# Patient Record
Sex: Female | Born: 1940 | Race: White | Hispanic: No | Smoking: Never smoker
Health system: Southern US, Community
[De-identification: ages and names within clinical notes are randomized; demographics above are authoritative.]

## PROBLEM LIST (undated history)

## (undated) DIAGNOSIS — K219 Gastro-esophageal reflux disease without esophagitis: Secondary | ICD-10-CM

## (undated) DIAGNOSIS — I10 Essential (primary) hypertension: Secondary | ICD-10-CM

## (undated) DIAGNOSIS — E119 Type 2 diabetes mellitus without complications: Secondary | ICD-10-CM

## (undated) DIAGNOSIS — E785 Hyperlipidemia, unspecified: Secondary | ICD-10-CM

## (undated) DIAGNOSIS — R002 Palpitations: Secondary | ICD-10-CM

## (undated) DIAGNOSIS — R945 Abnormal results of liver function studies: Secondary | ICD-10-CM

## (undated) DIAGNOSIS — I2510 Atherosclerotic heart disease of native coronary artery without angina pectoris: Secondary | ICD-10-CM

## (undated) HISTORY — DX: Essential (primary) hypertension: I10

## (undated) HISTORY — DX: Gastro-esophageal reflux disease without esophagitis: K21.9

## (undated) HISTORY — PX: ABDOMINAL HYSTERECTOMY: SHX81

## (undated) HISTORY — PX: CATARACT EXTRACTION: SUR2

## (undated) HISTORY — PX: KNEE SURGERY: SHX244

## (undated) HISTORY — DX: Type 2 diabetes mellitus without complications: E11.9

## (undated) HISTORY — PX: APPENDECTOMY: SHX54

## (undated) HISTORY — DX: Palpitations: R00.2

## (undated) HISTORY — DX: Atherosclerotic heart disease of native coronary artery without angina pectoris: I25.10

## (undated) HISTORY — PX: ESOPHAGEAL DILATION: SHX303

## (undated) HISTORY — DX: Abnormal results of liver function studies: R94.5

## (undated) HISTORY — DX: Hyperlipidemia, unspecified: E78.5

---

## 2005-06-17 ENCOUNTER — Ambulatory Visit: Payer: Self-pay | Admitting: Family Medicine

## 2006-06-24 ENCOUNTER — Ambulatory Visit: Payer: Self-pay | Admitting: Family Medicine

## 2007-03-03 HISTORY — PX: CARDIAC CATHETERIZATION: SHX172

## 2011-11-05 LAB — COMPREHENSIVE METABOLIC PANEL
ALT: 66 U/L — AB (ref 7–35)
Albumin: 4.3
BUN: 12 mg/dL (ref 4–21)
Calcium: 9.2 mg/dL
Creat: 0.87
Sodium: 143 mmol/L (ref 137–147)

## 2012-01-20 ENCOUNTER — Ambulatory Visit (INDEPENDENT_AMBULATORY_CARE_PROVIDER_SITE_OTHER): Payer: Medicare Other | Admitting: Gastroenterology

## 2012-01-20 ENCOUNTER — Encounter: Payer: Self-pay | Admitting: Gastroenterology

## 2012-01-20 VITALS — BP 125/72 | HR 68 | Temp 98.2°F | Ht 65.0 in | Wt 162.0 lb

## 2012-01-20 DIAGNOSIS — R109 Unspecified abdominal pain: Secondary | ICD-10-CM

## 2012-01-20 DIAGNOSIS — R1010 Upper abdominal pain, unspecified: Secondary | ICD-10-CM

## 2012-01-20 DIAGNOSIS — K7581 Nonalcoholic steatohepatitis (NASH): Secondary | ICD-10-CM | POA: Insufficient documentation

## 2012-01-20 DIAGNOSIS — K219 Gastro-esophageal reflux disease without esophagitis: Secondary | ICD-10-CM

## 2012-01-20 DIAGNOSIS — R7989 Other specified abnormal findings of blood chemistry: Secondary | ICD-10-CM

## 2012-01-20 DIAGNOSIS — R1110 Vomiting, unspecified: Secondary | ICD-10-CM | POA: Insufficient documentation

## 2012-01-20 DIAGNOSIS — Z86010 Personal history of colonic polyps: Secondary | ICD-10-CM

## 2012-01-20 DIAGNOSIS — R945 Abnormal results of liver function studies: Secondary | ICD-10-CM

## 2012-01-20 LAB — CBC WITH DIFFERENTIAL/PLATELET
Eosinophils Relative: 2 % (ref 0–5)
HCT: 41.4 % (ref 36.0–46.0)
Lymphocytes Relative: 31 % (ref 12–46)
Lymphs Abs: 3.2 10*3/uL (ref 0.7–4.0)
MCV: 81 fL (ref 78.0–100.0)
Monocytes Absolute: 0.7 10*3/uL (ref 0.1–1.0)
Platelets: 267 10*3/uL (ref 150–400)
RBC: 5.11 MIL/uL (ref 3.87–5.11)
WBC: 10.3 10*3/uL (ref 4.0–10.5)

## 2012-01-20 LAB — IRON AND TIBC
TIBC: 344 ug/dL (ref 250–470)
UIBC: 302 ug/dL (ref 125–400)

## 2012-01-20 MED ORDER — PEG 3350-KCL-NA BICARB-NACL 420 G PO SOLR: 4000.0000 mL | ORAL | Status: DC

## 2012-01-20 NOTE — Progress Notes (Signed)
Faxed to PCP

## 2012-01-20 NOTE — Progress Notes (Signed)
Primary Care Physician:  Chip Boer, [place]  Primary Gastroenterologist:  Barney Drain, MD   Chief Complaint  Patient presents with  . Gastrophageal Reflux  . Emesis  . abnormal labs    HPI:  Carmen [name] is a 71 y.o. female here at the request of her PCP for further evaluation of refractory reflux disease, vomiting, abnormal LFTs. She presents today with her daughter who is a Equities trader at [employer]. Patient states she has not felt well in a few months. Recently a couple of episodes of significant N/V. One time had eaten bacon/egg biscuit a few hours before. She complains of a bad taste in mouth. No heartburn. On Dexilant 66m daily. No dysphagia. Gallbladder remains in situ. BM regular. No melena, brbpr. Lost 6-7 pounds in the last few months, able to come off of DM meds. Intentional weight loss with dietary modifications. No melena, brpbr. Remote colonscopy, two small polyps. Remote EGD with dilation over 20 years ago.  Patient was told she had abnormal LFTs back in September 2013. She states at that time her cholesterol medication was held and she started taking fish oil. See below for lab results. When rechecked her transaminases remained elevated, see below. She denies any history of alcohol use in the present or past. She had negative viral markers. No abdominal ultrasound this point.  Current Outpatient Prescriptions  Medication Sig Dispense Refill  . amLODipine (NORVASC) 10 MG tablet Take 10 mg by mouth daily.      .Marland Kitchenaspirin 81 MG chewable tablet Chew 81 mg by mouth daily.      . clopidogrel (PLAVIX) 75 MG tablet Take 75 mg by mouth daily.      .Marland Kitchendexlansoprazole (DEXILANT) 60 MG capsule Take 60 mg by mouth daily.      . fish oil-omega-3 fatty acids 1000 MG capsule Take 2 g by mouth 2 (two) times daily.      .Marland KitchenFLUoxetine (PROZAC) 40 MG capsule Take 40 mg by mouth 2 (two) times daily. 2 pills in the morning 2 at night      . lisinopril (PRINIVIL,ZESTRIL) 2.5 MG tablet  Take 2.5 mg by mouth daily.      . metoprolol succinate (TOPROL-XL) 100 MG 24 hr tablet Take 100 mg by mouth daily. Take with or immediately following a meal.        Allergies as of 01/20/2012  . (No Known Allergies)    Past Medical History  Diagnosis Date  . Diabetes     diet controlled  . GERD (gastroesophageal reflux disease)     Esophagus dilated in the 1990s per patient  . Abnormal LFTs   . Hypertension   . Hyperlipidemia   . CAD (coronary artery disease)     Past Surgical History  Procedure Date  . Cardiac catheterization 2009    with stent  . Abdominal hysterectomy   . Esophageal dilation     In the 1990s  . Appendectomy     Family History  Problem Relation Age of Onset  . Heart attack Father   . Kidney disease Mother   . Diabetes Brother   . Colon cancer Neg Hx   . Liver disease Neg Hx     History   Social History  . Marital Status: Single    Spouse Name: N/A    Number of Children: 3  . Years of Education: N/A   Occupational History  . Not on file.   Social History Main Topics  . Smoking status:  Never Smoker   . Smokeless tobacco: Not on file  . Alcohol Use: No  . Drug Use: No  . Sexually Active: Not on file   Other Topics Concern  . Not on file   Social History Narrative  . No narrative on file      ROS:  General: Negative for anorexia, weight loss, fever, chills, fatigue, weakness. Eyes: Negative for vision changes.  ENT: Negative for hoarseness, nasal congestion. CV: Negative for chest pain, angina, palpitations, dyspnea on exertion, peripheral edema.  Respiratory: Negative for dyspnea at rest, dyspnea on exertion, cough, sputum, wheezing.  GI: See history of present illness. GU:  Negative for dysuria, hematuria, urinary incontinence, urinary frequency, nocturnal urination.  MS: Negative for joint pain, low back pain.  Derm: Negative for rash or itching.  Neuro: Negative for weakness, abnormal sensation, seizure, frequent  headaches, memory loss, confusion.  Psych: Negative for anxiety, depression, suicidal ideation, hallucinations.  Endo: Negative for unusual weight change.  Heme: Negative for bruising or bleeding. Allergy: Negative for rash or hives.    Physical Examination:  BP 125/72  Pulse 68  Temp 98.2 F (36.8 C) (Temporal)  Ht 5' 5"  (1.651 m)  Wt 162 lb (73.483 kg)  BMI 26.96 kg/m2   General: Well-nourished, well-developed in no acute distress. Accompanied by daughter. Head: Normocephalic, atraumatic.   Eyes: Conjunctiva pink, no icterus. Mouth: Oropharyngeal mucosa moist and pink , no lesions erythema or exudate. Neck: Supple without thyromegaly, masses, or lymphadenopathy.  Lungs: Clear to auscultation bilaterally.  Heart: Regular rate and rhythm, no murmurs rubs or gallops.  Abdomen: Bowel sounds are normal, nontender, nondistended, no hepatosplenomegaly or masses, no abdominal bruits or    hernia , no rebound or guarding.   Rectal: deferred to time of colonoscopy Extremities: No lower extremity edema. No clubbing or deformities.  Neuro: Alert and oriented x 4 , grossly normal neurologically.  Skin: Warm and dry, no rash or jaundice.   Psych: Alert and cooperative, normal mood and affect.  Labs: Labs from 11/05/2011. Glucose 104, BUN 12, creatinine 0.87, sodium 143, potassium 4.5, calcium 9.2, total bilirubin 0.7, alkaline phosphatase 104, AST 55, ALT 66, albumin 4.3, hemoglobin A1c 6.4, total cholesterol 149, HDL 46, LDL 70.  Labs from 01/13/2012. Total bilirubin 0.9, alkaline phosphatase 122, AST 48, ALT 90, albumin 4.4. H. Pylori IgA negative. H. Pylori IgG negative. Hepatitis B IgM nonreactive. Hepatitis B Core antibody IgM negative. Hepatitis B surface antigen negative. Hepatitis C antibody negative.  Imaging Studies: No results found.

## 2012-01-20 NOTE — Patient Instructions (Addendum)
We have scheduled you for an upper endoscopy and colonoscopy. Please see separate instructions.  Please have your lab work and ultrasound done.

## 2012-01-20 NOTE — Assessment & Plan Note (Signed)
Transaminitis, slightly worse recently compared to in September. Reportedly her statin was held in September. No prior history of alcohol use. 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 Rule out hemachromatosis. Viral markers were negative. Abdominal ultrasound to evaluate liver. We'll also check gallbladder given recent vomiting and upper abdominal discomfort postprandially. Further recommendations to follow.

## 2012-01-20 NOTE — Assessment & Plan Note (Signed)
History of colonic polyps, last colonoscopy every 10 years ago per her report. She would like to pursue used surveillance colonoscopy at this time which is reasonable.  I have discussed the risks, alternatives, benefits with regards to but not limited to the risk of reaction to medication, bleeding, infection, perforation and the patient is agreeable to proceed. Written consent to be obtained.

## 2012-01-20 NOTE — Progress Notes (Signed)
REVIEWED.  

## 2012-01-20 NOTE — Assessment & Plan Note (Signed)
Refractory GERD, intermittent vomiting on chronic Dexilant. Last upper endoscopy nearly 20 years ago per her report, required esophageal dilation at that time. Recommend upper endoscopy for further evaluation. Cannot exclude gallbladder disease at this point.  I have discussed the risks, alternatives, benefits with regards to but not limited to the risk of reaction to medication, bleeding, infection, perforation and the patient is agreeable to proceed. Written consent to be obtained.

## 2012-01-21 LAB — HEPATIC FUNCTION PANEL
AST: 48 U/L
Alkaline Phosphatase: 122 U/L
HELICOBACTER PYLORI AB, IGA: NEGATIVE
Helicobacter Pylori Antibody-IgG: NEGATIVE
Total Bilirubin: 0.9 mg/dL

## 2012-01-22 ENCOUNTER — Ambulatory Visit (HOSPITAL_COMMUNITY)
Admission: RE | Admit: 2012-01-22 | Discharge: 2012-01-22 | Disposition: A | Discharge status: Home / Self Care | Payer: Medicare Other | Source: Ambulatory Visit | Attending: Gastroenterology | Admitting: Gastroenterology

## 2012-01-22 DIAGNOSIS — K7689 Other specified diseases of liver: Secondary | ICD-10-CM | POA: Insufficient documentation

## 2012-01-22 DIAGNOSIS — R945 Abnormal results of liver function studies: Secondary | ICD-10-CM

## 2012-01-22 DIAGNOSIS — R7989 Other specified abnormal findings of blood chemistry: Secondary | ICD-10-CM | POA: Insufficient documentation

## 2012-01-22 DIAGNOSIS — R1110 Vomiting, unspecified: Secondary | ICD-10-CM | POA: Insufficient documentation

## 2012-01-22 DIAGNOSIS — R161 Splenomegaly, not elsewhere classified: Secondary | ICD-10-CM | POA: Insufficient documentation

## 2012-01-22 DIAGNOSIS — R109 Unspecified abdominal pain: Secondary | ICD-10-CM | POA: Insufficient documentation

## 2012-01-22 DIAGNOSIS — R1010 Upper abdominal pain, unspecified: Secondary | ICD-10-CM

## 2012-01-22 NOTE — Progress Notes (Signed)
Quick Note:  LMOM to call. ______ 

## 2012-01-22 NOTE — Progress Notes (Signed)
Quick Note:  No gallstones. Fatty liver. Mild splenomegaly. Await EGD findings. Based on findings, she may need further work-up/imaging due to splenomegaly. ______

## 2012-01-22 NOTE — Progress Notes (Signed)
Quick Note:  No anemia. Iron low normal, fe sat low, ferritin low normal.  No signs of hemochromatosis. Await EGD/TCS and abd u/s. ______

## 2012-02-01 ENCOUNTER — Encounter (HOSPITAL_COMMUNITY): Payer: Self-pay | Admitting: Pharmacy Technician

## 2012-02-15 ENCOUNTER — Encounter (HOSPITAL_COMMUNITY)
Admission: RE | Disposition: A | Discharge status: Home / Self Care | Payer: Self-pay | Source: Ambulatory Visit | Attending: Gastroenterology

## 2012-02-15 ENCOUNTER — Encounter (HOSPITAL_COMMUNITY): Payer: Self-pay | Admitting: *Deleted

## 2012-02-15 ENCOUNTER — Ambulatory Visit (HOSPITAL_COMMUNITY)
Admission: RE | Admit: 2012-02-15 | Discharge: 2012-02-15 | Disposition: A | Discharge status: Home / Self Care | Payer: Medicare Other | Source: Ambulatory Visit | Attending: Gastroenterology | Admitting: Gastroenterology

## 2012-02-15 DIAGNOSIS — K2970 Gastritis, unspecified, without bleeding: Secondary | ICD-10-CM

## 2012-02-15 DIAGNOSIS — D126 Benign neoplasm of colon, unspecified: Secondary | ICD-10-CM

## 2012-02-15 DIAGNOSIS — D1330 Benign neoplasm of unspecified part of small intestine: Secondary | ICD-10-CM

## 2012-02-15 DIAGNOSIS — Z86010 Personal history of colonic polyps: Secondary | ICD-10-CM

## 2012-02-15 DIAGNOSIS — R1110 Vomiting, unspecified: Secondary | ICD-10-CM

## 2012-02-15 DIAGNOSIS — I10 Essential (primary) hypertension: Secondary | ICD-10-CM | POA: Insufficient documentation

## 2012-02-15 DIAGNOSIS — K219 Gastro-esophageal reflux disease without esophagitis: Secondary | ICD-10-CM

## 2012-02-15 DIAGNOSIS — K648 Other hemorrhoids: Secondary | ICD-10-CM | POA: Insufficient documentation

## 2012-02-15 DIAGNOSIS — K2990 Gastroduodenitis, unspecified, without bleeding: Secondary | ICD-10-CM

## 2012-02-15 DIAGNOSIS — K5730 Diverticulosis of large intestine without perforation or abscess without bleeding: Secondary | ICD-10-CM | POA: Insufficient documentation

## 2012-02-15 DIAGNOSIS — R945 Abnormal results of liver function studies: Secondary | ICD-10-CM

## 2012-02-15 DIAGNOSIS — Z1211 Encounter for screening for malignant neoplasm of colon: Secondary | ICD-10-CM

## 2012-02-15 DIAGNOSIS — K222 Esophageal obstruction: Secondary | ICD-10-CM

## 2012-02-15 DIAGNOSIS — E119 Type 2 diabetes mellitus without complications: Secondary | ICD-10-CM | POA: Insufficient documentation

## 2012-02-15 DIAGNOSIS — R1013 Epigastric pain: Secondary | ICD-10-CM

## 2012-02-15 DIAGNOSIS — K2940 Chronic atrophic gastritis without bleeding: Secondary | ICD-10-CM | POA: Insufficient documentation

## 2012-02-15 DIAGNOSIS — D128 Benign neoplasm of rectum: Secondary | ICD-10-CM | POA: Insufficient documentation

## 2012-02-15 DIAGNOSIS — R1010 Upper abdominal pain, unspecified: Secondary | ICD-10-CM

## 2012-02-15 HISTORY — PX: ESOPHAGEAL DILATION: SHX303

## 2012-02-15 HISTORY — PX: COLONOSCOPY WITH ESOPHAGOGASTRODUODENOSCOPY (EGD): SHX5779

## 2012-02-15 SURGERY — COLONOSCOPY WITH ESOPHAGOGASTRODUODENOSCOPY (EGD)
Anesthesia: Moderate Sedation

## 2012-02-15 MED ORDER — PROMETHAZINE HCL 25 MG/ML IJ SOLN
INTRAMUSCULAR | Status: DC | PRN
  Administered 2012-02-15: 12.5 mg via INTRAVENOUS

## 2012-02-15 MED ORDER — MIDAZOLAM HCL 5 MG/5ML IJ SOLN
INTRAMUSCULAR | Status: DC | PRN
  Administered 2012-02-15 (×2): 1 mg via INTRAVENOUS
  Administered 2012-02-15 (×2): 2 mg via INTRAVENOUS

## 2012-02-15 MED ORDER — SODIUM CHLORIDE 0.45 % IV SOLN
INTRAVENOUS | Status: DC
  Administered 2012-02-15: 1000 mL via INTRAVENOUS

## 2012-02-15 MED ORDER — PANTOPRAZOLE SODIUM 40 MG PO TBEC: DELAYED_RELEASE_TABLET | ORAL | Status: DC

## 2012-02-15 MED ORDER — MEPERIDINE HCL 100 MG/ML IJ SOLN
INTRAMUSCULAR | Status: DC | PRN
  Administered 2012-02-15 (×4): 25 mg via INTRAVENOUS

## 2012-02-15 MED ORDER — MINERAL OIL PO OIL
TOPICAL_OIL | ORAL | Status: AC
  Filled 2012-02-15: qty 30

## 2012-02-15 MED ORDER — STERILE WATER FOR IRRIGATION IR SOLN
Status: DC | PRN
  Administered 2012-02-15: 09:00:00

## 2012-02-15 MED ORDER — PROMETHAZINE HCL 25 MG/ML IJ SOLN
INTRAMUSCULAR | Status: AC
  Filled 2012-02-15: qty 1

## 2012-02-15 MED ORDER — MEPERIDINE HCL 100 MG/ML IJ SOLN
INTRAMUSCULAR | Status: AC
  Filled 2012-02-15: qty 1

## 2012-02-15 MED ORDER — MIDAZOLAM HCL 5 MG/5ML IJ SOLN
INTRAMUSCULAR | Status: AC
  Filled 2012-02-15: qty 10

## 2012-02-15 MED ORDER — SODIUM CHLORIDE 0.9 % IJ SOLN
INTRAMUSCULAR | Status: AC
  Filled 2012-02-15: qty 10

## 2012-02-15 NOTE — H&P (Signed)
Primary Care Physician:  Chip Boer, PA Primary Gastroenterologist:  Dr. Oneida Alar  Pre-Procedure History & Physical: HPI:  Carmen [name] is a 71 y.o. female here for DYSPEPSIA/ PERSONAL HISTORY OF POLYPS. Pt stopped Asa/Plavix.  Past Medical History  Diagnosis Date  . Diabetes     diet controlled  . GERD (gastroesophageal reflux disease)     Esophagus dilated in the 1990s per patient  . Abnormal LFTs   . Hypertension   . Hyperlipidemia   . CAD (coronary artery disease)     Past Surgical History  Procedure Date  . Cardiac catheterization 2009    with stent  . Abdominal hysterectomy   . Esophageal dilation     In the 1990s  . Appendectomy     Prior to Admission medications   Medication Sig Start Date End Date Taking? Authorizing Provider  amLODipine (NORVASC) 10 MG tablet Take 10 mg by mouth daily.   Yes Historical Provider, MD  dexlansoprazole (DEXILANT) 60 MG capsule Take 60 mg by mouth daily.   Yes Historical Provider, MD  fish oil-omega-3 fatty acids 1000 MG capsule Take 1 g by mouth 2 (two) times daily.    Yes Historical Provider, MD  FLUoxetine (PROZAC) 40 MG capsule Take 80 mg by mouth 2 (two) times daily. 2 pills in the morning 2 at night   Yes Historical Provider, MD  lisinopril (PRINIVIL,ZESTRIL) 2.5 MG tablet Take 2.5 mg by mouth daily.   Yes Historical Provider, MD  metoprolol succinate (TOPROL-XL) 100 MG 24 hr tablet Take 100 mg by mouth daily. Take with or immediately following a meal.   Yes Historical Provider, MD  polyethylene glycol-electrolytes (TRILYTE) 420 G solution Take 4,000 mLs by mouth as directed. 01/20/12  Yes Danie Binder, MD  aspirin 81 MG chewable tablet Chew 81 mg by mouth daily.    Historical Provider, MD  clopidogrel (PLAVIX) 75 MG tablet Take 75 mg by mouth daily.    Historical Provider, MD    Allergies as of 01/20/2012  . (No Known Allergies)    Family History  Problem Relation Age of Onset  . Heart attack Father   . Kidney  disease Mother   . Diabetes Brother   . Colon cancer Neg Hx   . Liver disease Neg Hx     History   Social History  . Marital Status: Single    Spouse Name: N/A    Number of Children: 3  . Years of Education: N/A   Occupational History  . Not on file.   Social History Main Topics  . Smoking status: Never Smoker   . Smokeless tobacco: Not on file  . Alcohol Use: No  . Drug Use: No  . Sexually Active: Not on file   Other Topics Concern  . Not on file   Social History Narrative  . No narrative on file    Review of Systems: See HPI, otherwise negative ROS   Physical Exam: BP 139/69  Pulse 62  Temp 98 F (36.7 C) (Oral)  Resp 20  Ht 5' 5"  (1.651 m)  Wt 162 lb (73.483 kg)  BMI 26.96 kg/m2  SpO2 93% General:   Alert,  pleasant and cooperative in NAD Head:  Normocephalic and atraumatic. Neck:  Supple; Lungs:  Clear throughout to auscultation.    Heart:  Regular rate and rhythm. Abdomen:  Soft, nontender and nondistended. Normal bowel sounds, without guarding, and without rebound.   Neurologic:  Alert and  oriented x4;  grossly normal  neurologically.  Impression/Plan:     DYSPEPSIA/personal history of polyps  PLAN:  TCS/EGD TODAY

## 2012-02-15 NOTE — Op Note (Addendum)
[facility] [address] [place], [address]   COLONOSCOPY PROCEDURE REPORT  PATIENT: Carmen, [name]  MR#: [id number] BIRTHDATE: 05/13/1940 , 71  yrs. old GENDER: Female ENDOSCOPIST: Barney Drain, MD REFERRED BY:   Chip Boer, PA-C PROCEDURE DATE:  02/15/2012 PROCEDURE:   Colonoscopy with cold biopsy polypectomy and Colonoscopy with snare polypectomy INDICATIONS:High risk patient with personal history of colonic polyps. MEDICATIONS: Demerol 50 mg IV, Versed 5 mg IV, and Promethazine (Phenergan) 12.4m IV  DESCRIPTION OF PROCEDURE:    Physical exam was performed.  Informed consent was obtained from the patient after explaining the benefits, risks, and alternatives to procedure.  The patient was connected to monitor and placed in left lateral position. Continuous oxygen was provided by nasal cannula and IV medicine administered through an indwelling cannula.  After administration of sedation and rectal exam, the patients rectum was intubated and the Pentax Colonoscope [id number] colonoscope was advanced under direct visualization to the cecum.  The scope was removed slowly by carefully examining the color, texture, anatomy, and integrity mucosa on the way out.  The patient was recovered in endoscopy and discharged home in satisfactory condition.     COLON FINDINGS: Three sessile polyps measuring 3-8 mm in size were found at the cecum.  A polypectomy was performed with cold forceps and using snare cautery.  The resection was complete and the polyp tissue was completely retrieved, Eight polyps measuring 3-8 mm in size were found in the ascending colon.  A polypectomy was performed with cold forceps and using snare cautery.  , A single polyp measuring 6 mm in size was found in the transverse colon. Polypectomy was performed using hot snare.  , Two polyps ranging between 3-532min size were found in the sigmoid colon.  A polypectomy was performed with cold  forceps.  , A sessile polyp measuring 6 mm in size was found in the rectum.  A polypectomy was performed using snare cautery.  , Mild diverticulosis was noted in the sigmoid colon.  , and Small internal hemorrhoids were found.   PREP QUALITY: good. CECAL W/D TIME: 36 minutes  COMPLICATIONS: None  ENDOSCOPIC IMPRESSION: MULTIPLE COLON POLYPS MILD SIGMOID DIVERTICULOSIS SMALL INTERNAL HEMORRHOIDS NO SOURCE FOR ABD PAIN AND WEIGHT LOSS IDENTIFIED  RECOMMENDATIONS: Await biopsy HIGH FIBER DIET RESTART ASA/PLAVIX 12/19 TCS IN 1 YEAR IF ADVANCED POLYPS & 3 YEARS IF SIMPLE POLYPS ALL FIRST DEGREE RELATIVES NEEDS TCS AT AGE 71     _______________________________ eSigned: Barney DrainMD 02/15/2012 10:04 AM Revised: 02/15/2012 10:04 AM    PATIENT NAME:  Carmen, CasadosR#: [id number]

## 2012-02-15 NOTE — Op Note (Signed)
[facility] [address] [place], [address]   ENDOSCOPY PROCEDURE REPORT  PATIENT: Carmen, [name]  MR#: [id number] BIRTHDATE: September 03, 1940 , 71  yrs. old GENDER: Female  ENDOSCOPIST: Barney Drain, MD REFFERED IY:MEBR Claggett, PA-C  PROCEDURE DATE:  02/15/2012 PROCEDURE:   EGD with biopsy for H.  pylori and EGD with dilatation over guidewire  INDICATIONS:1.  epigastric pain, weight loss, AFRAID TO EAT DUE TO NAUSEA AND PAIN-PMHX: EGD/DIL 90s, GERD, CAD. TAKES ASA/PLAVIX, BUT HELD IT FOR HER PROCEDURE. ELEVATED ALT. ABD U/S-FATTY LIVER  MEDICATIONS: TCS+ Demerol 50 mg IV and Versed 74m IV TOPICAL ANESTHETIC: Cetacaine Spray  DESCRIPTION OF PROCEDURE:   After the risks benefits and alternatives of the procedure were thoroughly explained, informed consent was obtained.  The [id number] (([id number] and [id number] (([id number]  endoscope was introduced through the mouth and advanced to the second portion of the duodenum. The instrument was slowly withdrawn as the mucosa was carefully examined.  Prior to withdrawal of the scope, the guidwire was placed.  The esophagus was dilated successfully.  The patient was recovered in endoscopy and discharged home in satisfactory condition.      ESOPHAGUS: A stricture was found at the gastroesophageal junction. The stenosis was traversable with the endoscope.   NO BARRETT'S.  STOMACH: Mild non-erosive gastritis (inflammation) was found in the gastric antrum.  Multiple biopsies were performed using cold forceps.  DUODENUM: A sessile polyp measuring 4 mm in size was found in the duodenal bulb.  A polypectomy was performed with a cold forceps. The duodenal mucosa showed no abnormalities in the 2nd part of the duodenum.  Dilation was then performed at the gastroesphageal junction Dilator: Savary over guidewire Size(s): 12.8-16 MM Resistance: minimal Heme: yes SML  COMPLICATIONS: There were no complications.   ENDOSCOPIC  IMPRESSION: 1.   Stricture was found at the gastroesophageal junction 2.   Non-erosive gastritis (inflammation) was found in the gastric antrum; multiple biopsies 3.   Sessile polyp measuring 4 mm in size was found in the duodenal bulb 4.   The duodenal mucosa showed no abnormalities in the 2nd part of the duodenum 5.   POST-PRANDIAL PAIN/NAUSEA MAY BE DUE TO ESOPHAGEAL STRICTURE, AND/OR GASTRITIS. DIFFERENTIAL DIAGNOSIS INCLUDES CHRONIC MESENTERIC ISCHEMIA.    RECOMMENDATIONS: CONTINUE Dexilant forever.  RESTART ASPIRIN & PLAVIX 12/19.  AVOID ITEMS THAT TRIGGER GASTRITIS.  FOLLOW A HIGH FIBER/LOW FAT DIET.  AVOID ITEMS THAT CAUSE BLOATING.   BIOPSY RESULTS WILL BE BACK IN 7 DAYS.  FOLLOW UP IN 3 MOS WITH DR.  Flor Houdeshell.      _______________________________ eLorrin MaisBarney Drain MD 02/15/2012 1:32 PM      PATIENT NAME:  Carmen [name], LaymonMR#: [id number]

## 2012-02-17 NOTE — Progress Notes (Signed)
Quick Note:  Reviewed EGD/TCS findings. Path still pending.  Patient to follow up with [facility] in 3 months. No further imaging for mild splenomegaly at this time. Will readdress at time of f/u ov. ______

## 2012-02-18 ENCOUNTER — Encounter (HOSPITAL_COMMUNITY): Payer: Self-pay | Admitting: Gastroenterology

## 2012-02-18 NOTE — Progress Notes (Signed)
REVIEWED.  REFER PT TO HEMATOLOGY FOR SPLENOMEGALY.

## 2012-02-18 NOTE — Progress Notes (Signed)
Please let patient know, Dr. Oneida Alar recommends she be referred to hematologist (Dr. Tressie Stalker), for enlarged spleen seen on prior u/s in 01/2012.

## 2012-02-19 ENCOUNTER — Telehealth: Payer: Self-pay | Admitting: Gastroenterology

## 2012-02-19 NOTE — Telephone Encounter (Signed)
PT HELD ASA/PLAVIX. DOSE TODAY. HAD ONE BLOODY WATERY STOOL TODAY. HAD 15 POLYPS REMOVED 12/16. HOLD ASA/PLAVIX. MAY RESTART MON 12/23. SOFT DIET. GO TO ED IF PT HAS ANOTHER LARGE BLOODY STOOL.

## 2012-02-22 ENCOUNTER — Telehealth: Payer: Self-pay | Admitting: Gastroenterology

## 2012-02-22 NOTE — Telephone Encounter (Signed)
Called patient TO DISCUSS RESULTS & FOLLOW UP ON RECTAL BLEEDING. LVM-CALL [phone] TO DISCUSS. She had MULTIPLE simple adenomas removed from her colon. SHE HAD ONE SIMPLE ADENOMA REMOVED FROM HER SMALL BOWEL. HER stomach Bx shows mild gastritis.   CONTINUE Dexilant forever.   RESTART ASPIRIN & PLAVIX 12/23.  FOLLOW A HIGH FIBER/LOW FAT DIET. AVOID ITEMS THAT CAUSE BLOATING. SEE INFO BELOW.  FOLLOW UP IN 3 MOS WITH DR. FIELDS.  REPEAT COLONOSCOPY IN 3 YEARS.  ALL FIRST DEGREE RELATIVES NEEDS TCS AT AGE 80.

## 2012-02-22 NOTE — Progress Notes (Signed)
Called and informed pt's daughter, Daine Floras.

## 2012-02-23 NOTE — Telephone Encounter (Signed)
Results faxed to PCP, recalls made

## 2012-02-29 ENCOUNTER — Telehealth: Payer: Self-pay | Admitting: Gastroenterology

## 2012-02-29 NOTE — Progress Notes (Signed)
Pt was informed about the referral to hematology. Routing to [facility] for the referral.

## 2012-02-29 NOTE — Telephone Encounter (Signed)
Patient called this morning and asked if the nurse would call her at her daughter's house [phone]. She was asking about her results from a procedure she had about 2 weeks ago.

## 2012-02-29 NOTE — Telephone Encounter (Signed)
See phone note of 02/22/2012.

## 2012-02-29 NOTE — Telephone Encounter (Signed)
Pt had called and requested results. I told her that Dr. Oneida Alar and called to discuss on 02/22/2012. She said she has not had any more rectal bleeding , just had it that one time. She is doing fine. I gave her all of the info below. She said that Dr. Oneida Alar sent in a prescription for her to the pharmacy for Pantoprozole  40 mg   Bid and she is taking it and doing well. Please advise if she is to have the Dexilant or Pantoprozole. ( She said it said bid for 3 months and then do once daily.

## 2012-03-01 NOTE — Progress Notes (Signed)
Referral has been faxed to Dr. Karen Kitchens office and they will contact the patient with date & time

## 2012-03-03 ENCOUNTER — Other Ambulatory Visit: Payer: Self-pay | Admitting: Gastroenterology

## 2012-03-03 MED ORDER — PANTOPRAZOLE SODIUM 40 MG PO TBEC: DELAYED_RELEASE_TABLET | ORAL | Status: DC

## 2012-03-03 NOTE — Progress Notes (Signed)
Protonix controls sx. Rx sent.

## 2012-03-03 NOTE — Telephone Encounter (Addendum)
PLEASE CALL PT.  SHE MAY TAKE PROTONIX OR DEXILANT TO CONTROL HER REFLUX SYMPTOMS. SHE SHOULD NOT TAKE BOTH. SHE SHOULD TAKE THE MED THAT BEST CONTROLS HER Sx. SHE SHOULD LET ME KNOW WHICH ONE SHE CHOOSES.

## 2012-03-03 NOTE — Telephone Encounter (Signed)
Pt said the Protonix is working and that is the one she is going to take.

## 2012-03-08 ENCOUNTER — Emergency Department (HOSPITAL_COMMUNITY): Payer: Medicare Other

## 2012-03-08 ENCOUNTER — Encounter (HOSPITAL_COMMUNITY): Payer: Self-pay | Admitting: *Deleted

## 2012-03-08 ENCOUNTER — Other Ambulatory Visit: Payer: Self-pay | Admitting: Urgent Care

## 2012-03-08 ENCOUNTER — Telehealth: Payer: Self-pay | Admitting: *Deleted

## 2012-03-08 ENCOUNTER — Emergency Department (HOSPITAL_COMMUNITY)
Admission: EM | Admit: 2012-03-08 | Discharge: 2012-03-08 | Disposition: A | Discharge status: Home / Self Care | Payer: Medicare Other | Attending: Emergency Medicine | Admitting: Emergency Medicine

## 2012-03-08 DIAGNOSIS — Z79899 Other long term (current) drug therapy: Secondary | ICD-10-CM | POA: Insufficient documentation

## 2012-03-08 DIAGNOSIS — Z860100 Personal history of colon polyps, unspecified: Secondary | ICD-10-CM | POA: Insufficient documentation

## 2012-03-08 DIAGNOSIS — R1110 Vomiting, unspecified: Secondary | ICD-10-CM

## 2012-03-08 DIAGNOSIS — I2510 Atherosclerotic heart disease of native coronary artery without angina pectoris: Secondary | ICD-10-CM | POA: Insufficient documentation

## 2012-03-08 DIAGNOSIS — E119 Type 2 diabetes mellitus without complications: Secondary | ICD-10-CM | POA: Insufficient documentation

## 2012-03-08 DIAGNOSIS — E785 Hyperlipidemia, unspecified: Secondary | ICD-10-CM | POA: Insufficient documentation

## 2012-03-08 DIAGNOSIS — R112 Nausea with vomiting, unspecified: Secondary | ICD-10-CM

## 2012-03-08 DIAGNOSIS — R630 Anorexia: Secondary | ICD-10-CM | POA: Insufficient documentation

## 2012-03-08 DIAGNOSIS — R5381 Other malaise: Secondary | ICD-10-CM | POA: Insufficient documentation

## 2012-03-08 DIAGNOSIS — R110 Nausea: Secondary | ICD-10-CM

## 2012-03-08 DIAGNOSIS — K219 Gastro-esophageal reflux disease without esophagitis: Secondary | ICD-10-CM | POA: Insufficient documentation

## 2012-03-08 DIAGNOSIS — R634 Abnormal weight loss: Secondary | ICD-10-CM

## 2012-03-08 DIAGNOSIS — I10 Essential (primary) hypertension: Secondary | ICD-10-CM | POA: Insufficient documentation

## 2012-03-08 DIAGNOSIS — N390 Urinary tract infection, site not specified: Secondary | ICD-10-CM | POA: Insufficient documentation

## 2012-03-08 DIAGNOSIS — R5383 Other fatigue: Secondary | ICD-10-CM | POA: Insufficient documentation

## 2012-03-08 LAB — COMPREHENSIVE METABOLIC PANEL
ALT: 106 U/L — ABNORMAL HIGH (ref 0–35)
AST: 57 U/L — ABNORMAL HIGH (ref 0–37)
BUN: 16 mg/dL (ref 6–23)
CO2: 29 mEq/L (ref 19–32)
Calcium: 9.6 mg/dL (ref 8.4–10.5)
Chloride: 100 mEq/L (ref 96–112)
Creat: 0.82 mg/dL (ref 0.50–1.10)
Total Bilirubin: 1 mg/dL (ref 0.3–1.2)

## 2012-03-08 LAB — URINALYSIS W MICROSCOPIC + REFLEX CULTURE
Casts: NONE SEEN
Crystals: NONE SEEN
Nitrite: POSITIVE — AB
Specific Gravity, Urine: >1.03 — ABNORMAL HIGH (ref 1.005–1.030)
Urobilinogen, UA: 0.2 mg/dL (ref 0.0–1.0)
pH: 5.5 (ref 5.0–8.0)

## 2012-03-08 LAB — CBC WITH DIFFERENTIAL/PLATELET
Basophils Relative: 0 % (ref 0–1)
Eosinophils Absolute: 0.2 10*3/uL (ref 0.0–0.7)
Eosinophils Relative: 1 % (ref 0–5)
HCT: 42.7 % (ref 36.0–46.0)
Hemoglobin: 14 g/dL (ref 12.0–15.0)
MCH: 27.2 pg (ref 26.0–34.0)
MCHC: 32.8 g/dL (ref 30.0–36.0)
MCV: 83.1 fL (ref 78.0–100.0)
Monocytes Absolute: 0.9 10*3/uL (ref 0.1–1.0)
Monocytes Relative: 8 % (ref 3–12)
RDW: 13.7 % (ref 11.5–15.5)

## 2012-03-08 LAB — LIPASE: Lipase: 30 U/L (ref 11–59)

## 2012-03-08 MED ORDER — ONDANSETRON HCL 4 MG/2ML IJ SOLN
4.0000 mg | Freq: Once | INTRAMUSCULAR | Status: AC
  Administered 2012-03-08: 4 mg via INTRAVENOUS
  Filled 2012-03-08: qty 2

## 2012-03-08 MED ORDER — ONDANSETRON HCL 4 MG PO TABS: 4.0000 mg | ORAL_TABLET | Freq: Four times a day (QID) | ORAL | Status: DC | PRN

## 2012-03-08 MED ORDER — CIPROFLOXACIN HCL 500 MG PO TABS: 500.0000 mg | ORAL_TABLET | Freq: Two times a day (BID) | ORAL | Status: DC

## 2012-03-08 MED ORDER — CIPROFLOXACIN HCL 250 MG PO TABS
500.0000 mg | ORAL_TABLET | Freq: Once | ORAL | Status: AC
Start: 2012-03-08 — End: 2012-03-08
  Administered 2012-03-08: 500 mg via ORAL
  Filled 2012-03-08: qty 2

## 2012-03-08 MED ORDER — SODIUM CHLORIDE 0.9 % IV SOLN
INTRAVENOUS | Status: DC
  Administered 2012-03-08: 14:00:00 via INTRAVENOUS

## 2012-03-08 NOTE — Telephone Encounter (Signed)
TSH was also ordered

## 2012-03-08 NOTE — Telephone Encounter (Signed)
Patients daughter called and stated that they are taking Carmen [name] to the ER she is very weak, Urine is very strong and dark

## 2012-03-08 NOTE — Telephone Encounter (Signed)
Called [phone] and spoke to daughter Daine Floras. She said her mom is having problems with nausea and would like to know if she can have some Zofran sent to [facility] in [place]. She said her mom just looks weak and is not eating good, but she doesn't want to tell them a lot, doesn't want them to worry about her. Then she had me call her mom on her cell at [phone]. Pt said she is having vomiting about once every other day. Michela Pitcher it would probably be more than that, but she is trying to watch what she eats. Pt said she is taking the Protonix bid. She is aware that Dr. Oneida Alar is not in today and I will message to Vickey Huger, NP.

## 2012-03-08 NOTE — Telephone Encounter (Signed)
Patient is scheduled for [facility] on Thursday Jan 9th at 8:00 am

## 2012-03-08 NOTE — Telephone Encounter (Signed)
Ms Carmen [name] called today. She is having compications from her surgery Dr Oneida Alar did a few weeks ago. She has no appetitie, and is vomitting. she would like a call back. Thank you.

## 2012-03-08 NOTE — Telephone Encounter (Signed)
Lab orders faxed to [facility].

## 2012-03-08 NOTE — ED Notes (Signed)
Pt presents with N/V that has been increasing past 3 days. Pt has been seen by Dr Oneida Alar  Over past month for said c/o. Pt feels is dehydrated secondary to increased vomiting.Pt is alert oriented x 4 .

## 2012-03-08 NOTE — Telephone Encounter (Signed)
Agree 

## 2012-03-08 NOTE — ED Notes (Signed)
Pt c/o n/v, generalized weakness, poor appetite for  3-4 months  pt was sent to lab for blood tests this am by pcp in [place], pt had cbc, cmet, urine done, LFT, was upset when she was told that she would have to wait two days for the results, pt spoke with pcp at [facility] who advised pt that if she felt that bad she would need to come to the er.

## 2012-03-08 NOTE — Telephone Encounter (Signed)
Spoke w/ pt.  Feels "sick" all the time.  Denies headaches.  Occasional dizziness.  C/o bad reflux.  Vomiting every other day.  Worse for 3 months.

## 2012-03-08 NOTE — Addendum Note (Signed)
Addended by: Andria Meuse on: 03/08/2012 10:20 AM   Modules accepted: Orders

## 2012-03-08 NOTE — Telephone Encounter (Signed)
Pt has appt with Dr. Tressie Stalker on 03/22/2012.

## 2012-03-08 NOTE — ED Provider Notes (Signed)
History  This chart was scribed for Carmen Latina III, MD by Mikel Cella, ED Scribe. This patient was seen in room [facility]17[facility] and the patient's care was started at 1313.   CSN: [id number]  Arrival date & time 03/08/12  1242   First MD Initiated Contact with Patient 03/08/12 1313      Chief Complaint  Patient presents with  . Nausea  . Emesis     The history is provided by the patient. No language interpreter was used.    ALLECIA [name] is a 72 y.o. female who presents to the Emergency Department complaining of emesis with associated emesis, generalized weakness, poor taste in mouth and poor appetite. She describes the taste as "poison in her mouth." She states the symptoms began 3-4 months ago and have remained constant. She denies any fever, ear ache, sore throat, cough, SOB, visual disturbances, CP, urinary issues, bowel issues, syncope, and rash as associated symptoms. She states she was seen by her PCP earlier today who ran a CBC, CMET, UA and LFT. She does not know the results currently. She reports her PCP told her to come to the ED if she still felt ill. She states she had her esophagus dilated  and polyps removed a few weeks ago. She reports she had a stent placed 3 years ago.    Past Medical History  Diagnosis Date  . Diabetes     diet controlled  . GERD (gastroesophageal reflux disease)     Esophagus dilated in the 1990s per patient  . Abnormal LFTs   . Hypertension   . Hyperlipidemia   . CAD (coronary artery disease)     Past Surgical History  Procedure Date  . Cardiac catheterization 2009    with stent  . Abdominal hysterectomy   . Esophageal dilation     In the 1990s  . Appendectomy   . Colonoscopy with esophagogastroduodenoscopy (egd) 02/15/2012    Procedure: COLONOSCOPY WITH ESOPHAGOGASTRODUODENOSCOPY (EGD);  Surgeon: Danie Binder, MD;  Location: [facility];  Service: Endoscopy;  Laterality: N/A;  8:30  . Esophageal dilation 02/15/2012   Procedure: ESOPHAGEAL DILATION;  Surgeon: Danie Binder, MD;  Location: [facility];  Service: Endoscopy;;    Family History  Problem Relation Age of Onset  . Heart attack Father   . Kidney disease Mother   . Diabetes Brother   . Colon cancer Neg Hx   . Liver disease Neg Hx     History  Substance Use Topics  . Smoking status: Never Smoker   . Smokeless tobacco: Not on file  . Alcohol Use: No   No OB history available.   Review of Systems  Constitutional: Positive for appetite change. Negative for fever.  HENT: Negative for ear pain, congestion, sore throat, rhinorrhea and sneezing.   Eyes: Negative for visual disturbance.  Respiratory: Negative for chest tightness, shortness of breath and wheezing.   Gastrointestinal: Positive for nausea and vomiting. Negative for diarrhea, constipation, blood in stool and rectal pain.  Genitourinary: Negative for dysuria, frequency, hematuria, difficulty urinating and dyspareunia.  Skin: Negative for rash and wound.  Neurological: Positive for weakness.  All other systems reviewed and are negative.    Allergies  Review of patient's allergies indicates no known allergies.  Home Medications   Current Outpatient Rx  Name  Route  Sig  Dispense  Refill  . AMLODIPINE BESYLATE 10 MG PO TABS   Oral   Take 10 mg by mouth daily.         Marland Kitchen  OMEGA-3 FATTY ACIDS 1000 MG PO CAPS   Oral   Take 1 g by mouth 2 (two) times daily.          Marland Kitchen FLUOXETINE HCL 40 MG PO CAPS   Oral   Take 80 mg by mouth 2 (two) times daily. 2 pills in the morning 2 at night         . LISINOPRIL 2.5 MG PO TABS   Oral   Take 2.5 mg by mouth daily.         Marland Kitchen METOPROLOL SUCCINATE ER 100 MG PO TB24   Oral   Take 100 mg by mouth daily. Take with or immediately following a meal.         . ONDANSETRON HCL 4 MG PO TABS   Oral   Take 1 tablet (4 mg total) by mouth every 6 (six) hours as needed for nausea.   30 tablet   1   . PANTOPRAZOLE SODIUM 40 MG PO  TBEC      1 PO 30 MINUTES PRIOR TO MEALS BID FOR 3 MOS THEN QD   62 tablet   11   . PEG 3350-KCL-NA BICARB-NACL 420 G PO SOLR   Oral   Take 4,000 mLs by mouth as directed.   4000 mL   0     Triage Vitals:BP 151/64  Pulse 65  Temp 98.1 F (36.7 C)  Resp 20  SpO2 99%  Physical Exam  Nursing note and vitals reviewed. Constitutional: She is oriented to person, place, and time. She appears well-developed and well-nourished. No distress.  HENT:  Head: Normocephalic and atraumatic.  Eyes: Conjunctivae normal and EOM are normal. Pupils are equal, round, and reactive to light.  Neck: Normal range of motion. Neck supple. No tracheal deviation present.  Cardiovascular: Normal rate, regular rhythm and normal heart sounds.   Pulmonary/Chest: Effort normal and breath sounds normal. No respiratory distress.  Abdominal: Soft. Bowel sounds are normal. She exhibits no distension.  Musculoskeletal: Normal range of motion. She exhibits no edema.  Neurological: She is alert and oriented to person, place, and time.  Skin: Skin is warm and dry. No rash noted.  Psychiatric: She has a normal mood and affect. Her behavior is normal.    ED Course  Procedures (including critical care time)  DIAGNOSTIC STUDIES: Oxygen Saturation is 99% on room air, normal by my interpretation.    COORDINATION OF CARE:  1:29 PM: Discussed treatment plan which includes EKG, IV fluids and CXR with pt at bedside and pt agreed to plan.   2:29 PM:   Date: 03/08/2012  Rate: 60  Rhythm: normal sinus rhythm  QRS Axis: normal  Intervals: QT prolonged  ST/T Wave abnormalities: nonspecific ST/T changes  Conduction Disutrbances:none  Narrative Interpretation: Abnormal EKG  Old EKG Reviewed: none available   Results for orders placed in visit on 03/08/12  COMPREHENSIVE METABOLIC PANEL      Component Value Range   Sodium 138  135 - 145 mEq/L   Potassium 4.0  3.5 - 5.3 mEq/L   Chloride 100  96 - 112 mEq/L   CO2  29  19 - 32 mEq/L   Glucose, Bld 150 (*) 70 - 99 mg/dL   BUN 16  6 - 23 mg/dL   Creat 0.82  0.50 - 1.10 mg/dL   Total Bilirubin 1.0  0.3 - 1.2 mg/dL   Alkaline Phosphatase 101  39 - 117 U/L   AST 57 (*) 0 - 37 U/L  ALT 106 (*) 0 - 35 U/L   Total Protein 6.8  6.0 - 8.3 g/dL   Albumin 3.7  3.5 - 5.2 g/dL   Calcium 9.6  8.4 - 10.5 mg/dL  LIPASE      Component Value Range   Lipase 30  11 - 59 U/L  URINALYSIS WITH CULTURE REFLEX      Component Value Range   Color, Urine YELLOW  YELLOW   APPearance CLOUDY (*) CLEAR   Specific Gravity, Urine >1.030 (*) 1.005 - 1.030   pH 5.5  5.0 - 8.0   Glucose, UA NEG  NEG mg/dL   Bilirubin Urine NEG  NEG   Ketones, ur NEG  NEG mg/dL   Hgb urine dipstick NEG  NEG   Protein, ur NEG  NEG mg/dL   Urobilinogen, UA 0.2  0.0 - 1.0 mg/dL   Nitrite POS (*) NEG   Leukocytes, UA SMALL (*) NEG   Squamous Epithelial / LPF FEW  RARE   Crystals NONE SEEN  NONE SEEN   Casts NONE SEEN  NONE SEEN   WBC, UA 21-50 (*) <3 WBC/hpf   RBC / HPF NONE SEEN  <3 RBC/hpf   Bacteria, UA MANY (*) RARE  CBC WITH DIFFERENTIAL      Component Value Range   WBC 11.4 (*) 4.0 - 10.5 K/uL   RBC 5.14 (*) 3.87 - 5.11 MIL/uL   Hemoglobin 14.0  12.0 - 15.0 g/dL   HCT 42.7  36.0 - 46.0 %   MCV 83.1  78.0 - 100.0 fL   MCH 27.2  26.0 - 34.0 pg   MCHC 32.8  30.0 - 36.0 g/dL   RDW 13.7  11.5 - 15.5 %   Platelets 272  150 - 400 K/uL   Neutrophils Relative 61  43 - 77 %   Neutro Abs 6.9  1.7 - 7.7 K/uL   Lymphocytes Relative 30  12 - 46 %   Lymphs Abs 3.4  0.7 - 4.0 K/uL   Monocytes Relative 8  3 - 12 %   Monocytes Absolute 0.9  0.1 - 1.0 K/uL   Eosinophils Relative 1  0 - 5 %   Eosinophils Absolute 0.2  0.0 - 0.7 K/uL   Basophils Relative 0  0 - 1 %   Basophils Absolute 0.0  0.0 - 0.1 K/uL   Smear Review Criteria for review not met     Dg Abd Acute W/chest  03/08/2012  *RADIOLOGY REPORT*  Clinical Data: 72 year old female with abdominal pain and recurrent vomiting.  ACUTE  ABDOMEN SERIES (ABDOMEN 2 VIEW & CHEST 1 VIEW)  Comparison: None  Findings: The cardiomediastinal silhouette is unremarkable. There is no evidence of airspace disease, pleural effusion, or pneumothorax. A possible hiatal hernia is noted. The bowel gas pattern is unremarkable without evidence of bowel obstruction. There is no evidence of pneumoperitoneum or suspicious calcifications. Degenerative changes of the lumbar spine are noted.  IMPRESSION: No evidence of acute abnormality.  Question hiatal hernia.   Original Report Authenticated By: Margarette Canada, M.D.     3:23 PM:  Lab tests reviewed with pt and daughter they showed elevated white count and urinary tract infection, the infection will be treated with Cipro      1. Urinary tract infection     I personally performed the services described in this documentation, which was scribed in my presence. The recorded information has been reviewed and is accurate. Katy Apo, MD      Antony Haste  Iran Sizer, MD 03/08/12 [phone]

## 2012-03-08 NOTE — Telephone Encounter (Signed)
C/o weight loss.  Denies fever, but does not have thermometer. Denies abdominal pain. Blood sugars dropping low 80s. Pt prefers not to go to ER. Advised to go directly to lab for CBC, CMP, lipase & UA Plenty fluids Needs GES ASAP TD:[id number] N/V Zofran to be sent to pharmacy

## 2012-03-09 NOTE — Telephone Encounter (Signed)
Quick Note:  LMOM to call. ______ 

## 2012-03-09 NOTE — Telephone Encounter (Signed)
It has been canceled

## 2012-03-09 NOTE — Telephone Encounter (Signed)
Quick Note:  Called and informed pt. She wants [place] to cancel GES and she will call when she is feeling better. ( She already said she feels much better since ED visit yesterday. ______

## 2012-03-09 NOTE — Telephone Encounter (Signed)
I Havent spoken to the patient but I have canceled the GES with Nuc Med

## 2012-03-09 NOTE — Telephone Encounter (Signed)
Quick Note:  Pt was seen and treated for UTI in ER yesterday. Please call her this morning Please let her know her thyroid was normal. All other labs were addressed in ER. We need to hold off on GES until she has recovered from acute UTI. Hope she feels better, if nausea persists, call back to reschedule GES. Thanks ZW:[id number],PBHE, PA  ______

## 2012-03-10 ENCOUNTER — Ambulatory Visit (HOSPITAL_COMMUNITY): Payer: Medicare Other

## 2012-03-10 LAB — URINE CULTURE: Colony Count: >=100000

## 2012-03-10 NOTE — Progress Notes (Signed)
Quick Note:  Treated in ER w/ cipro. OA:[id number],XAFH, PA  ______

## 2012-03-10 NOTE — Progress Notes (Signed)
Results faxed to PCP

## 2012-03-22 ENCOUNTER — Encounter (HOSPITAL_COMMUNITY): Payer: Self-pay | Admitting: Oncology

## 2012-03-22 ENCOUNTER — Encounter (HOSPITAL_COMMUNITY): Payer: Medicare Other | Attending: Oncology | Admitting: Oncology

## 2012-03-22 VITALS — BP 110/70 | HR 67 | Temp 98.4°F | Resp 18 | Ht 63.5 in | Wt 159.0 lb

## 2012-03-22 DIAGNOSIS — I10 Essential (primary) hypertension: Secondary | ICD-10-CM | POA: Insufficient documentation

## 2012-03-22 DIAGNOSIS — R7989 Other specified abnormal findings of blood chemistry: Secondary | ICD-10-CM | POA: Insufficient documentation

## 2012-03-22 DIAGNOSIS — E663 Overweight: Secondary | ICD-10-CM | POA: Insufficient documentation

## 2012-03-22 DIAGNOSIS — R161 Splenomegaly, not elsewhere classified: Secondary | ICD-10-CM | POA: Insufficient documentation

## 2012-03-22 DIAGNOSIS — R945 Abnormal results of liver function studies: Secondary | ICD-10-CM

## 2012-03-22 DIAGNOSIS — K219 Gastro-esophageal reflux disease without esophagitis: Secondary | ICD-10-CM | POA: Insufficient documentation

## 2012-03-22 DIAGNOSIS — E785 Hyperlipidemia, unspecified: Secondary | ICD-10-CM | POA: Insufficient documentation

## 2012-03-22 DIAGNOSIS — I2510 Atherosclerotic heart disease of native coronary artery without angina pectoris: Secondary | ICD-10-CM | POA: Insufficient documentation

## 2012-03-22 DIAGNOSIS — K7689 Other specified diseases of liver: Secondary | ICD-10-CM | POA: Insufficient documentation

## 2012-03-22 NOTE — Patient Instructions (Addendum)
.  [place] Discharge Instructions  RECOMMENDATIONS MADE BY THE CONSULTANT AND ANY TEST RESULTS WILL BE SENT TO YOUR REFERRING PHYSICIAN.  EXAM FINDINGS BY THE PHYSICIAN TODAY AND SIGNS OR SYMPTOMS TO REPORT TO CLINIC OR PRIMARY PHYSICIAN: you have a fatty liver and mild spleen enlargement. We will get ct scan of abdomen to look at liver more closely    [place]: Work on diet control and weight reduction of about 2 lbs a month  SPECIAL INSTRUCTIONS/FOLLOW-UP:   Thank you for choosing [facility] to provide your oncology and hematology care.  To afford each patient quality time with our providers, please arrive at least 15 minutes before your scheduled appointment time.  With your help, our goal is to use those 15 minutes to complete the necessary work-up to ensure our physicians have the information they need to help with your evaluation and healthcare recommendations.    Effective January 1st, 2014, we ask that you re-schedule your appointment with our physicians should you arrive 10 or more minutes late for your appointment.  We strive to give you quality time with our providers, and arriving late affects you and other patients whose appointments are after yours.    Again, thank you for choosing [facility].  Our hope is that these requests will decrease the amount of time that you wait before being seen by our physicians.       _____________________________________________________________  Should you have questions after your visit to [facility], please contact our office at [phone] between the hours of 8:30 a.m. and 5:00 p.m.  Voicemails left after 4:30 p.m. will not be returned until the following business day.  For prescription refill requests, have your pharmacy contact our office with your prescription refill request.

## 2012-03-22 NOTE — Progress Notes (Signed)
Carmen [name] presented for labwork. Labs per MD order drawn via Peripheral Line 25 gauge needle inserted in lt ac.  Good blood return present. Procedure without incident.  Needle removed intact. Patient tolerated procedure well.

## 2012-03-22 NOTE — Progress Notes (Signed)
Problem number 1 mild splenomegaly Problem #2 fatty infiltration of the liver Problem #3 excessive weight times many years weight 185+ pounds since the birth of her last 42 she states. She has lost 25 pounds in 8 months approximately. Problem #4 GERD with chronic nausea/vomiting Problem #5 abnormal LFTs secondary to #2 Problem #6 hypertension Problem #7 history of hyperlipidemia Problem #8 history of coronary artery disease status post stent placement years ago on aspirin and Plavix Problem #9 history of abdominal hysterectomy for benign disease Problem #10 history of appendectomy Very pleasant 72 year old Caucasian woman who lives in [place] with her daughter who is with her today. Her daughter is a Marine scientist at [employer] in [place]. She was seen by Dr. Oneida Alar do to abnormal liver enzymes along with ongoing episodes of nausea, upset stomach, vomiting intermittently and weight loss some of which she believes is strictly voluntary as well.  She has never drank alcohol. She is not a smoker. She is divorced. She has 2 sons and one daughter in good health to the best of her knowledge. Her daughter is with her but is clearly overweight.  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  BP 110/70  Pulse 67  Temp 98.4 F (36.9 C) (Oral)  Resp 18  Ht 5' 3.5" (1.613 m)  Wt 159 lb (72.122 kg)  BMI 27.72 kg/m2  Pleasant lady in no acute distress. She has no thyromegaly. She has no lymphadenopathy in any location. Her lungs are clear. Heart shows a regular rhythm and rate without murmur rub or gallop. Breast exam is negative for any masses. Abdomen is soft and nontender without hepatosplenomegaly. I could not feel a spleen even in the right lateral decubitus position. Bowel sounds are diminished. She has no  leg edema no arm edema. She is right handed. Skin exam is benign. HEENT exam shows an upper dental plate numerous lower missing teeth but tongue is unremarkable. Throat is clear. Pupils are equally round and reactive to light. Facial symmetry appears intact.   I would like to get a baseline alpha-fetoprotein. And a baseline CAT scan of her Tommy to make sure she does not have any intra-hepatic lesions and to get a accurate spleen size. I would like to then see her back in year with laboratory work to make sure her platelets are okay, liver enzymes are okay etc.  She is trying to really changed her diet and we'll try to start working in some exercise program to help calorie consumption. Her goal hopefully is to lose 22 pounds per month until she is approximately 130 pounds to 135 pounds. She will followup with Dr. Oneida Alar as scheduled. She will continue her medications as they are

## 2012-03-28 ENCOUNTER — Ambulatory Visit (HOSPITAL_COMMUNITY)
Admission: RE | Admit: 2012-03-28 | Discharge: 2012-03-28 | Disposition: A | Discharge status: Home / Self Care | Payer: Medicare Other | Source: Ambulatory Visit | Attending: Oncology | Admitting: Oncology

## 2012-03-28 DIAGNOSIS — Z860100 Personal history of colon polyps, unspecified: Secondary | ICD-10-CM | POA: Insufficient documentation

## 2012-03-28 DIAGNOSIS — R945 Abnormal results of liver function studies: Secondary | ICD-10-CM

## 2012-03-28 DIAGNOSIS — R9389 Abnormal findings on diagnostic imaging of other specified body structures: Secondary | ICD-10-CM | POA: Insufficient documentation

## 2012-03-28 DIAGNOSIS — R1110 Vomiting, unspecified: Secondary | ICD-10-CM | POA: Insufficient documentation

## 2012-03-28 DIAGNOSIS — R7989 Other specified abnormal findings of blood chemistry: Secondary | ICD-10-CM | POA: Insufficient documentation

## 2012-03-28 MED ORDER — IOHEXOL 300 MG/ML  SOLN
100.0000 mL | Freq: Once | INTRAMUSCULAR | Status: AC | PRN
  Administered 2012-03-28: 100 mL via INTRAVENOUS

## 2012-03-28 NOTE — Progress Notes (Signed)
REVIEWED.  TCS DEC 2013 MULTIPLE SIMPLE ADENOMAS Results EGD DEC 2013 GASTRITIS, DUODENAL POLYP OPV MAR 2014 W/ SLF

## 2012-03-30 ENCOUNTER — Other Ambulatory Visit (HOSPITAL_COMMUNITY): Payer: Self-pay | Admitting: Oncology

## 2012-03-30 DIAGNOSIS — N390 Urinary tract infection, site not specified: Secondary | ICD-10-CM

## 2012-03-31 ENCOUNTER — Telehealth (HOSPITAL_COMMUNITY): Payer: Self-pay | Admitting: Oncology

## 2012-03-31 ENCOUNTER — Other Ambulatory Visit (HOSPITAL_COMMUNITY): Payer: Medicare Other

## 2012-03-31 NOTE — Telephone Encounter (Signed)
Per Dr. Tressie Stalker, I needed to speak with the patient's daughter, Carmen [name], to review the CT results and follow-up.  I spoke to [facility], advised of CT findings and need for Carmen [name] to have a clean catch urine performed today or tomorrow - also that they needed to make a decision on a urologist and gen. surgeon to follow-up with.  Per [name] will be in the clinic tomorrow around 0930 to provide the urine sample, and she will have the additional information requested by that time.  All questions were answered at this point, and [name] states she will have her mother here in the morning.

## 2012-04-01 ENCOUNTER — Telehealth (HOSPITAL_COMMUNITY): Payer: Self-pay | Admitting: Oncology

## 2012-04-01 ENCOUNTER — Other Ambulatory Visit (HOSPITAL_COMMUNITY): Payer: Self-pay | Admitting: Oncology

## 2012-04-01 DIAGNOSIS — N390 Urinary tract infection, site not specified: Secondary | ICD-10-CM

## 2012-04-01 LAB — URINALYSIS, ROUTINE W REFLEX MICROSCOPIC
Bilirubin Urine: NEGATIVE
Glucose, UA: NEGATIVE mg/dL
Hgb urine dipstick: NEGATIVE
Nitrite: POSITIVE — AB
Specific Gravity, Urine: 1.015 (ref 1.005–1.030)
pH: 6 (ref 5.0–8.0)

## 2012-04-01 LAB — URINE MICROSCOPIC-ADD ON

## 2012-04-01 NOTE — Telephone Encounter (Signed)
Carmen [name] is positive for UTI - per Dr. Tressie Stalker, Macrobid called in to [facility] and patient verbalized understanding of needing to start the medication today and take until complete.

## 2012-04-03 LAB — URINE CULTURE: Colony Count: >=100000

## 2012-04-04 ENCOUNTER — Emergency Department (HOSPITAL_COMMUNITY)
Admission: EM | Admit: 2012-04-04 | Discharge: 2012-04-04 | Disposition: A | Discharge status: Home / Self Care | Payer: Medicare Other | Attending: Emergency Medicine | Admitting: Emergency Medicine

## 2012-04-04 ENCOUNTER — Encounter (HOSPITAL_COMMUNITY): Payer: Self-pay | Admitting: *Deleted

## 2012-04-04 ENCOUNTER — Other Ambulatory Visit (HOSPITAL_COMMUNITY): Payer: Medicare Other

## 2012-04-04 DIAGNOSIS — R350 Frequency of micturition: Secondary | ICD-10-CM | POA: Insufficient documentation

## 2012-04-04 DIAGNOSIS — Z862 Personal history of diseases of the blood and blood-forming organs and certain disorders involving the immune mechanism: Secondary | ICD-10-CM | POA: Insufficient documentation

## 2012-04-04 DIAGNOSIS — Z79899 Other long term (current) drug therapy: Secondary | ICD-10-CM | POA: Insufficient documentation

## 2012-04-04 DIAGNOSIS — I10 Essential (primary) hypertension: Secondary | ICD-10-CM | POA: Insufficient documentation

## 2012-04-04 DIAGNOSIS — I2510 Atherosclerotic heart disease of native coronary artery without angina pectoris: Secondary | ICD-10-CM | POA: Insufficient documentation

## 2012-04-04 DIAGNOSIS — K219 Gastro-esophageal reflux disease without esophagitis: Secondary | ICD-10-CM | POA: Insufficient documentation

## 2012-04-04 DIAGNOSIS — Z8639 Personal history of other endocrine, nutritional and metabolic disease: Secondary | ICD-10-CM | POA: Insufficient documentation

## 2012-04-04 DIAGNOSIS — Z7902 Long term (current) use of antithrombotics/antiplatelets: Secondary | ICD-10-CM | POA: Insufficient documentation

## 2012-04-04 DIAGNOSIS — E119 Type 2 diabetes mellitus without complications: Secondary | ICD-10-CM | POA: Insufficient documentation

## 2012-04-04 DIAGNOSIS — R112 Nausea with vomiting, unspecified: Secondary | ICD-10-CM | POA: Insufficient documentation

## 2012-04-04 DIAGNOSIS — R5383 Other fatigue: Secondary | ICD-10-CM | POA: Insufficient documentation

## 2012-04-04 DIAGNOSIS — R42 Dizziness and giddiness: Secondary | ICD-10-CM | POA: Insufficient documentation

## 2012-04-04 DIAGNOSIS — N390 Urinary tract infection, site not specified: Secondary | ICD-10-CM

## 2012-04-04 DIAGNOSIS — R5381 Other malaise: Secondary | ICD-10-CM | POA: Insufficient documentation

## 2012-04-04 DIAGNOSIS — Z7982 Long term (current) use of aspirin: Secondary | ICD-10-CM | POA: Insufficient documentation

## 2012-04-04 LAB — CBC WITH DIFFERENTIAL/PLATELET
Basophils Relative: 0 % (ref 0–1)
Eosinophils Relative: 1 % (ref 0–5)
HCT: 41.4 % (ref 36.0–46.0)
Hemoglobin: 13.5 g/dL (ref 12.0–15.0)
Lymphocytes Relative: 22 % (ref 12–46)
MCHC: 32.6 g/dL (ref 30.0–36.0)
MCV: 81.8 fL (ref 78.0–100.0)
Monocytes Absolute: 0.6 10*3/uL (ref 0.1–1.0)
Monocytes Relative: 6 % (ref 3–12)
Neutro Abs: 6.4 10*3/uL (ref 1.7–7.7)

## 2012-04-04 LAB — COMPREHENSIVE METABOLIC PANEL
BUN: 13 mg/dL (ref 6–23)
CO2: 29 mEq/L (ref 19–32)
Calcium: 9.2 mg/dL (ref 8.4–10.5)
Chloride: 101 mEq/L (ref 96–112)
Creatinine, Ser: 0.76 mg/dL (ref 0.50–1.10)
GFR calc non Af Amer: 83 mL/min — ABNORMAL LOW (ref >90)
Total Bilirubin: 0.9 mg/dL (ref 0.3–1.2)

## 2012-04-04 LAB — URINALYSIS, ROUTINE W REFLEX MICROSCOPIC
Leukocytes, UA: NEGATIVE
Nitrite: NEGATIVE
Urobilinogen, UA: 0.2 mg/dL (ref 0.0–1.0)

## 2012-04-04 LAB — LIPASE, BLOOD: Lipase: 22 U/L (ref 11–59)

## 2012-04-04 MED ORDER — SODIUM CHLORIDE 0.9 % IV SOLN
1000.0000 mL | Freq: Once | INTRAVENOUS | Status: AC
  Administered 2012-04-04: 1000 mL via INTRAVENOUS

## 2012-04-04 MED ORDER — SODIUM CHLORIDE 0.9 % IV SOLN
1000.0000 mL | INTRAVENOUS | Status: DC
  Administered 2012-04-04: 1000 mL via INTRAVENOUS

## 2012-04-04 MED ORDER — ONDANSETRON HCL 4 MG/2ML IJ SOLN
4.0000 mg | INTRAMUSCULAR | Status: DC | PRN
  Administered 2012-04-04: 4 mg via INTRAVENOUS
  Filled 2012-04-04: qty 2

## 2012-04-04 MED ORDER — PROMETHAZINE HCL 12.5 MG PO TABS: 12.5000 mg | ORAL_TABLET | Freq: Four times a day (QID) | ORAL | Status: DC | PRN

## 2012-04-04 MED ORDER — CEPHALEXIN 500 MG PO CAPS: 500.0000 mg | ORAL_CAPSULE | Freq: Three times a day (TID) | ORAL | Status: DC

## 2012-04-04 MED ORDER — DEXTROSE 5 % IV SOLN
1.0000 g | Freq: Once | INTRAVENOUS | Status: AC
  Administered 2012-04-04: 1 g via INTRAVENOUS
  Filled 2012-04-04: qty 10

## 2012-04-04 NOTE — ED Notes (Signed)
Per family pt has received 2 liters of fluid.

## 2012-04-04 NOTE — ED Provider Notes (Signed)
History   This chart was scribed for Janice Norrie, MD by Marin Comment, ED Scribe. The patient was seen in room APA03/APA03. Patient's care was started at 1500.    CSN: [id number]  Arrival date & time 04/04/12  1441   First MD Initiated Contact with Patient 04/04/12 1500      Chief Complaint  Patient presents with  . Emesis    The history is provided by the patient. No language interpreter was used.  Carmen [name] is a 72 y.o. female who presents to the Emergency Department complaining of gradually worsening, severe emesis with associated nausea, light-headedness, generalized weakness and increased urinary frequency for the past 3-4 days. She reports vomiting daily and with last episode at noon today. She denies any diarrhea, abdominal pain, fevers, dysuria. She states she has had problems with vomiting over the past few months but has not vomited daily until the past 3-4 days. She has taken PO Zofran without relief. She states that she had a colonscopy and endoscopy on 02/15/12 preformed by Dr. Oneida Alar that showed hiatal hernia, polyps, diverticulosis and gastritis. She also had abnormal liver labs and an ultrasound that showed an enlarged liver and spleen. She was referred to Dr. Tressie Stalker at the [facility]. 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 She was dx with an UTI from e-coli 3 days ago and was started on Macrobid. She was then referred to Dr. Geroge Baseman. She reports a h/o receiving Botox for urinary incontinence but reports she hasn't had any urinary incontinence since. She also reports a h/o UTI's. Daughter wants her to be admitted, states Dr Tressie Stalker told them to come to the ED to be admitted.   PCP: [facility] GI: Dr. Oneida Alar   Past Medical History  Diagnosis Date  . Diabetes     diet controlled  . GERD (gastroesophageal reflux disease)     Esophagus dilated in the 1990s per patient  . Abnormal LFTs   .  Hypertension   . Hyperlipidemia   . CAD (coronary artery disease)     Past Surgical History  Procedure Date  . Abdominal hysterectomy   . Esophageal dilation     In the 1990s  . Appendectomy   . Colonoscopy with esophagogastroduodenoscopy (egd) 02/15/2012    Procedure: COLONOSCOPY WITH ESOPHAGOGASTRODUODENOSCOPY (EGD);  Surgeon: Danie Binder, MD;  Location: [facility];  Service: Endoscopy;  Laterality: N/A;  8:30  . Esophageal dilation 02/15/2012    Procedure: ESOPHAGEAL DILATION;  Surgeon: Danie Binder, MD;  Location: [facility];  Service: Endoscopy;;  . Cardiac catheterization 2009    with stent    Family History  Problem Relation Age of Onset  . Heart attack Father   . Kidney disease Mother   . Diabetes Brother   . Colon cancer Neg Hx   . Liver disease Neg Hx     History  Substance Use Topics  . Smoking status: Never Smoker   . Smokeless tobacco: Not on file  . Alcohol Use: No  She denise tobacco and alcohol use.  She living alone for the past month previously lived with daughter.   OB History    Grav Para Term Preterm Abortions TAB SAB Ect Mult Living                  Review of Systems  Constitutional: Negative for fever and chills.  Gastrointestinal: Positive for nausea and  vomiting. Negative for abdominal pain.  Genitourinary: Positive for frequency. Negative for dysuria.  Neurological: Positive for weakness and light-headedness.  All other systems reviewed and are negative.    Allergies  Review of patient's allergies indicates no known allergies.  Home Medications   Current Outpatient Rx  Name  Route  Sig  Dispense  Refill  . AMLODIPINE BESYLATE 10 MG PO TABS   Oral   Take 10 mg by mouth daily.         . ASPIRIN 81 MG PO CHEW   Oral   Chew 81 mg by mouth daily.         Marland Kitchen CLOPIDOGREL BISULFATE 75 MG PO TABS   Oral   Take 75 mg by mouth daily.         . OMEGA-3 FATTY ACIDS 1000 MG PO CAPS   Oral   Take 1 g by mouth 2 (two)  times daily.          Marland Kitchen FLUOXETINE HCL 40 MG PO CAPS   Oral   Take 80 mg by mouth 2 (two) times daily. 2 pills in the morning 2 at night         . IBUPROFEN 200 MG PO TABS   Oral   Take 600 mg by mouth every 6 (six) hours as needed. Pain         . LISINOPRIL 2.5 MG PO TABS   Oral   Take 2.5 mg by mouth daily.         Marland Kitchen METOPROLOL SUCCINATE ER 100 MG PO TB24   Oral   Take 100 mg by mouth daily. Take with or immediately following a meal.         . NAPROXEN 500 MG PO TABS   Oral   Take 500 mg by mouth 2 (two) times daily with a meal.         . NITROFURANTOIN MONOHYD MACRO 100 MG PO CAPS   Oral   Take 100 mg by mouth 2 (two) times daily.         Marland Kitchen ONDANSETRON HCL 4 MG PO TABS   Oral   Take 1 tablet (4 mg total) by mouth every 6 (six) hours as needed for nausea.   30 tablet   1   . PANTOPRAZOLE SODIUM 40 MG PO TBEC   Oral   Take 40 mg by mouth daily.           Triage Vitals: BP 127/103  Pulse 69  Temp 97.6 F (36.4 C) (Oral)  Resp 20  Ht 5' 5"  (1.651 m)  Wt 159 lb (72.122 kg)  BMI 26.46 kg/m2  SpO2 97%  Vital signs normal   Orthostatic vital signs are normal   Physical Exam  Nursing note and vitals reviewed. Constitutional: She is oriented to person, place, and time. She appears well-developed and well-nourished. No distress.  HENT:  Head: Normocephalic and atraumatic.  Right Ear: External ear normal.  Left Ear: External ear normal.  Nose: Nose normal.  Mouth/Throat: Mucous membranes are dry. No oropharyngeal exudate.       Tongue dry.   Eyes: Conjunctivae normal and EOM are normal. Pupils are equal, round, and reactive to light.  Neck: Normal range of motion. Neck supple. No tracheal deviation present.  Cardiovascular: Normal rate, regular rhythm and normal heart sounds.  Exam reveals no gallop and no friction rub.   No murmur heard. Pulmonary/Chest: Effort normal and breath sounds normal. No respiratory distress. She has  no wheezes. She  has no rhonchi. She has no rales.  Abdominal: Soft. Bowel sounds are normal. She exhibits no distension. There is no tenderness. There is no rebound and no guarding.  Musculoskeletal: Normal range of motion. She exhibits no edema.  Neurological: She is alert and oriented to person, place, and time.  Skin: Skin is warm and dry.  Psychiatric: She has a normal mood and affect. Her behavior is normal.    ED Course  Procedures (including critical care time)   Medications  0.9 %  sodium chloride infusion (0 mL Intravenous Stopped 04/04/12 1912)    Followed by  0.9 %  sodium chloride infusion (0 mL Intravenous Stopped 04/04/12 1914)  ondansetron (ZOFRAN) injection 4 mg (4 mg Intravenous Given 04/04/12 1535)  cephALEXin (KEFLEX) 500 MG capsule (not administered)  promethazine (PHENERGAN) 12.5 MG tablet (not administered)  cefTRIAXone (ROCEPHIN) 1 g in dextrose 5 % 50 mL IVPB (0 g Intravenous Stopped 04/04/12 1912)     DIAGNOSTIC STUDIES: Oxygen Saturation is 97% on room air, adequate by my interpretation.    COORDINATION OF CARE:  15:22-Discussed planned course of treatment with the patient including blood work and UA, who is agreeable at this time.   18:10-Recheck: She reports her nausea has improved and is wanting to try PO. Informed pt of lab results.   19:56-Recheck: Pt states that her nausea is still improved and after PO challenge she has not vomited. Pt has an appointment with Dr. Geroge Baseman tomorrow. Will change her antibiotic and d/c home.    Component  Value    Specimen Description  URINE, CLEAN CATCH    Special Requests  NONE    Culture Setup Time  04/02/2012 02:04    Colony Count  >=100,000 COLONIES/ML    Culture  ESCHERICHIA COLI    Report Status  04/03/2012 FINAL    Organism ID, Bacteria  ESCHERICHIA COLI    Resulting Agency  [facility]     Culture & Susceptibility     ESCHERICHIA COLI          Antibiotic  Sensitivity  Microscan  Status      AMPICILLIN  Resistant  >=32  Final       Method:  MIC      CEFAZOLIN  Sensitive  <=4  Final      Method:  MIC      CEFTRIAXONE  Sensitive  <=1  Final      Method:  MIC      CIPROFLOXACIN  Resistant  >=4  Final      Method:  MIC      GENTAMICIN  Sensitive  <=1  Final      Method:  MIC      LEVOFLOXACIN  Resistant  >=8  Final      Method:  MIC      NITROFURANTOIN  Sensitive  32  Final      Method:  MIC      PIP/TAZO  Sensitive  <=4  Final      Method:  MIC      TOBRAMYCIN  Sensitive  <=1  Final      Method:  MIC      TRIMETH/SULFA  Resistant  >=320  Final      Method:  MIC       Comments  ESCHERICHIA COLI (MIC)        ESCHERICHIA COLI      Results for orders placed during the hospital encounter of 04/04/12  CBC  WITH DIFFERENTIAL      Component Value Range   WBC 9.0  4.0 - 10.5 K/uL   RBC 5.06  3.87 - 5.11 MIL/uL   Hemoglobin 13.5  12.0 - 15.0 g/dL   HCT 41.4  36.0 - 46.0 %   MCV 81.8  78.0 - 100.0 fL   MCH 26.7  26.0 - 34.0 pg   MCHC 32.6  30.0 - 36.0 g/dL   RDW 13.9  11.5 - 15.5 %   Platelets 193  150 - 400 K/uL   Neutrophils Relative 70  43 - 77 %   Neutro Abs 6.4  1.7 - 7.7 K/uL   Lymphocytes Relative 22  12 - 46 %   Lymphs Abs 2.0  0.7 - 4.0 K/uL   Monocytes Relative 6  3 - 12 %   Monocytes Absolute 0.6  0.1 - 1.0 K/uL   Eosinophils Relative 1  0 - 5 %   Eosinophils Absolute 0.1  0.0 - 0.7 K/uL   Basophils Relative 0  0 - 1 %   Basophils Absolute 0.0  0.0 - 0.1 K/uL  COMPREHENSIVE METABOLIC PANEL      Component Value Range   Sodium 138  135 - 145 mEq/L   Potassium 3.7  3.5 - 5.1 mEq/L   Chloride 101  96 - 112 mEq/L   CO2 29  19 - 32 mEq/L   Glucose, Bld 152 (*) 70 - 99 mg/dL   BUN 13  6 - 23 mg/dL   Creatinine, Ser 0.76  0.50 - 1.10 mg/dL   Calcium 9.2  8.4 - 10.5 mg/dL   Total Protein 6.7  6.0 - 8.3 g/dL   Albumin 3.6  3.5 - 5.2 g/dL   AST 78 (*) 0 - 37 U/L   ALT 111 (*) 0 - 35 U/L   Alkaline Phosphatase 96  39 - 117 U/L   Total Bilirubin 0.9  0.3 - 1.2 mg/dL   GFR calc non Af Amer 83  (*) >90 mL/min   GFR calc Af Amer >90  >90 mL/min  URINALYSIS, ROUTINE W REFLEX MICROSCOPIC      Component Value Range   Color, Urine YELLOW  YELLOW   APPearance CLEAR  CLEAR   Specific Gravity, Urine 1.010  1.005 - 1.030   pH 6.5  5.0 - 8.0   Glucose, UA NEGATIVE  NEGATIVE mg/dL   Hgb urine dipstick SMALL (*) NEGATIVE   Bilirubin Urine NEGATIVE  NEGATIVE   Ketones, ur NEGATIVE  NEGATIVE mg/dL   Protein, ur TRACE (*) NEGATIVE mg/dL   Urobilinogen, UA 0.2  0.0 - 1.0 mg/dL   Nitrite NEGATIVE  NEGATIVE   Leukocytes, UA NEGATIVE  NEGATIVE  LIPASE, BLOOD      Component Value Range   Lipase 22  11 - 59 U/L  URINE MICROSCOPIC-ADD ON      Component Value Range   WBC, UA 3-6  <3 WBC/hpf   RBC / HPF 3-6  <3 RBC/hpf   Bacteria, UA FEW (*) RARE    Laboratory interpretation all normal except for hyperglycemia, elevation of LFTs    1. Nausea and vomiting   2. UTI (lower urinary tract infection)     New Prescriptions   CEPHALEXIN (KEFLEX) 500 MG CAPSULE    Take 1 capsule (500 mg total) by mouth 3 (three) times daily.   PROMETHAZINE (PHENERGAN) 12.5 MG TABLET    Take 1 tablet (12.5 mg total) by mouth every 6 (six) hours as needed for  nausea.    Plan discharge  Rolland Porter, MD, FACEP   MDM    I personally performed the services described in this documentation, which was scribed in my presence. The recorded information has been reviewed and considered.  Rolland Porter, MD, Abram Sander     Janice Norrie, MD 04/04/12 2002

## 2012-04-04 NOTE — ED Notes (Signed)
Vomiting, says she has E coli in urine and possible fisula.  Being seen by Dr Drue Stager for enlarged liver and spleen,  Started Macrobid on Friday

## 2012-04-05 ENCOUNTER — Other Ambulatory Visit (HOSPITAL_COMMUNITY): Payer: Self-pay | Admitting: General Surgery

## 2012-04-05 DIAGNOSIS — N321 Vesicointestinal fistula: Secondary | ICD-10-CM

## 2012-04-06 LAB — URINE CULTURE
Colony Count: NO GROWTH
Culture: NO GROWTH

## 2012-04-08 ENCOUNTER — Inpatient Hospital Stay (HOSPITAL_COMMUNITY)
Admission: RE | Admit: 2012-04-08 | Discharge: 2012-04-08 | Disposition: A | Discharge status: Home / Self Care | Payer: Medicare Other | Source: Ambulatory Visit | Attending: General Surgery | Admitting: General Surgery

## 2012-04-18 ENCOUNTER — Other Ambulatory Visit: Payer: Self-pay

## 2012-04-18 MED ORDER — ONDANSETRON HCL 4 MG PO TABS: 4.0000 mg | ORAL_TABLET | Freq: Four times a day (QID) | ORAL | Status: DC | PRN

## 2012-04-26 ENCOUNTER — Telehealth: Payer: Self-pay

## 2012-04-26 NOTE — Telephone Encounter (Signed)
Pt has had a lot of nausea and vomiting and said she needs to follow up with Dr. Oneida Alar for some more testing. OV with Dr. Oneida Alar tomorrow, 04/27/2012 at 10:00 AM.

## 2012-04-27 ENCOUNTER — Ambulatory Visit (INDEPENDENT_AMBULATORY_CARE_PROVIDER_SITE_OTHER): Payer: Medicare Other | Admitting: Gastroenterology

## 2012-04-27 ENCOUNTER — Encounter: Payer: Self-pay | Admitting: Gastroenterology

## 2012-04-27 VITALS — BP 135/85 | HR 69 | Temp 97.4°F | Ht 63.5 in | Wt 158.0 lb

## 2012-04-27 DIAGNOSIS — K219 Gastro-esophageal reflux disease without esophagitis: Secondary | ICD-10-CM

## 2012-04-27 DIAGNOSIS — R1110 Vomiting, unspecified: Secondary | ICD-10-CM

## 2012-04-27 MED ORDER — METOCLOPRAMIDE HCL 10 MG PO TABS: 5.0000 mg | ORAL_TABLET | Freq: Four times a day (QID) | ORAL | Status: DC

## 2012-04-27 NOTE — Patient Instructions (Signed)
CONTINUE PROTONIX. TAKE 30 MINUTES PRIOR TO MEALS TWICE DAILY.  WE WILL CALL YOUR INSURANCE CO AND GET A PRIOR AUTHORIZATION.  CONTINUE REGLAN. TAKE 30 MINS PRIOR TO MEALS AND AT BEDTIME.  FOLLOW A DIABETIC-GASTROPARESIS DIET. SEE HAND OUT.  FOLLOW UP IN 4 MOS.

## 2012-04-27 NOTE — Progress Notes (Signed)
Subjective:    Patient ID: Carmen [name], female    DOB: [birth year], 72 y.o.   MRN: [id number]  PCP: CLAGETT  HPI SAW DR. Tressie Stalker THEN SENT TO DR. ZIEGLER FOR POSSIBLE FISTULA. FELTS SHE HAD A UTI. RX WITH CIPRO. PUT HER ON REGLAN AND FEELS BETTER ON REGLAN BECAUSE ZOFRAN WAS NOT WORKING. PROTONIX HELPS BUT INSURANCE MAY NOT PAY FOR IT. UNABLE TO COMPLETE GES DUE TO VOMITING. KNOW TO HAVE DIABETES FOR SEVERAL YEARS.COMPLETED CIPRO FOR 14 DAYS. LAST DOES 2 WEEKS AGO.   FEELS LIKE DANCING. THE OLD MEN SHOOT A LINE OF S#@*. GOT RID OF HER BOYFRIEND. HAS HER OWN PLACE NOW.  URINE IS LIKE SPRING WATER. NO NAUSEA MEDICINE(PHENERGAN/ZOFRAN) SINCE REGLAN-STARTED FEB 4.  Past Medical History  Diagnosis Date  . Diabetes     diet controlled  . GERD (gastroesophageal reflux disease)     Esophagus dilated in the 1990s per patient  . Abnormal LFTs   . Hypertension   . Hyperlipidemia   . CAD (coronary artery disease)    Past Surgical History  Procedure Laterality Date  . Abdominal hysterectomy    . Esophageal dilation      In the 1990s  . Appendectomy    . Colonoscopy with esophagogastroduodenoscopy (egd)  02/15/2012    Procedure: COLONOSCOPY WITH ESOPHAGOGASTRODUODENOSCOPY (EGD);  Surgeon: Danie Binder, MD;  [facility];  Service: Endoscopy;  Laterality: N/A;  8:30  . Esophageal dilation  02/15/2012    Procedure: ESOPHAGEAL DILATION;  Surgeon: Danie Binder, MD;  Location: [facility];  Service: Endoscopy;;  . Cardiac catheterization  2009    with stent   No Known Allergies  Current Outpatient Prescriptions  Medication Sig Dispense Refill  . amLODipine (NORVASC) 10 MG tablet Take 10 mg by mouth daily.      Marland Kitchen aspirin 81 MG chewable tablet Chew 81 mg by mouth daily.      . clopidogrel (PLAVIX) 75 MG tablet Take 75 mg by mouth daily.      . fish oil-omega-3 fatty acids 1000 MG capsule Take 1 g by mouth 2 (two) times daily.       Marland Kitchen FLUoxetine (PROZAC) 40 MG capsule  Take 80 mg by mouth 2 (two) times daily. 2 pills in the morning 2 at night      . ibuprofen (ADVIL,MOTRIN) 200 MG tablet Take 600 mg by mouth every 6 (six) hours as needed. Pain  AS NEEDED    . lisinopril (PRINIVIL,ZESTRIL) 2.5 MG tablet Take 2.5 mg by mouth daily.      . metoCLOPramide (REGLAN) 10 MG tablet Take 10 mg by mouth QAC/HS      . metoprolol succinate (TOPROL-XL) 100 MG 24 hr tablet Take 100 mg by mouth daily. Take with or immediately following a meal.      . naproxen (NAPROSYN) 500 MG tablet Take 500 mg by mouth 2 (two) times daily with a meal. AS NEEDED    .        .      . pantoprazole (PROTONIX) 40 MG tablet Take 40 mg by mouth daily.    .      .          Review of Systems     Objective:   Physical Exam  Vitals reviewed. Constitutional: She is oriented to person, place, and time. She appears well-nourished. No distress.  HENT:  Head: Normocephalic and atraumatic.  Mouth/Throat: Oropharynx is clear and moist. No oropharyngeal  exudate.  Eyes: Pupils are equal, round, and reactive to light.  Neck: Normal range of motion. Neck supple.  Cardiovascular: Normal rate, regular rhythm and normal heart sounds.   Pulmonary/Chest: Effort normal and breath sounds normal. No respiratory distress.  Abdominal: Soft. Bowel sounds are normal. She exhibits no distension. There is no tenderness.  Musculoskeletal: She exhibits no edema.  Lymphadenopathy:    She has no cervical adenopathy.  Neurological: She is alert and oriented to person, place, and time.  NO  NEW FOCAL DEFICITS   Psychiatric: She has a normal mood and affect.          Assessment & Plan:

## 2012-04-27 NOTE — Progress Notes (Signed)
Faxed to PCP

## 2012-04-27 NOTE — Assessment & Plan Note (Signed)
SX CONTROLLED WITH REGLAN-MOST LIKELY DUE TO DIABETIC GASTROPARESIS/GERD.  DISCUSSED SIDE EFFECTS OF REGLAN. REDUCE REGLAN  TO 5 MG QAC/HS. PT WILL CALL IF SX WORSEN. DIABETIC/GASTROPARESIS DIET. HO GIVEN. OPV IN 4 MOS

## 2012-04-27 NOTE — Assessment & Plan Note (Signed)
Sx controlled WITH PROTONIX.  NEEDS PA FOR PROTONIX. OPV JUN 2014

## 2012-04-29 NOTE — Progress Notes (Signed)
Reminder in epic to folllow up in 4 months

## 2012-05-12 NOTE — Progress Notes (Signed)
REVIEWED.  

## 2012-07-21 ENCOUNTER — Encounter: Payer: Self-pay | Admitting: Gastroenterology

## 2012-07-21 ENCOUNTER — Ambulatory Visit: Payer: Medicare Other | Admitting: Gastroenterology

## 2012-08-10 ENCOUNTER — Ambulatory Visit (INDEPENDENT_AMBULATORY_CARE_PROVIDER_SITE_OTHER): Payer: Medicare Other | Admitting: Gastroenterology

## 2012-08-10 ENCOUNTER — Encounter: Payer: Self-pay | Admitting: Gastroenterology

## 2012-08-10 VITALS — BP 118/74 | HR 61 | Temp 97.6°F | Ht 66.0 in | Wt 146.0 lb

## 2012-08-10 DIAGNOSIS — R945 Abnormal results of liver function studies: Secondary | ICD-10-CM

## 2012-08-10 DIAGNOSIS — R1110 Vomiting, unspecified: Secondary | ICD-10-CM

## 2012-08-10 DIAGNOSIS — Z860100 Personal history of colon polyps, unspecified: Secondary | ICD-10-CM

## 2012-08-10 DIAGNOSIS — R7989 Other specified abnormal findings of blood chemistry: Secondary | ICD-10-CM

## 2012-08-10 DIAGNOSIS — K219 Gastro-esophageal reflux disease without esophagitis: Secondary | ICD-10-CM

## 2012-08-10 MED ORDER — METOCLOPRAMIDE HCL 10 MG PO TABS: ORAL_TABLET | ORAL | Status: DC

## 2012-08-10 MED ORDER — FLUOXETINE HCL 40 MG PO CAPS: ORAL_CAPSULE | ORAL | Status: DC

## 2012-08-10 MED ORDER — PANTOPRAZOLE SODIUM 40 MG PO TBEC: DELAYED_RELEASE_TABLET | ORAL | Status: DC

## 2012-08-10 NOTE — Progress Notes (Signed)
Subjective:    Patient ID: Carmen [name], female    DOB: 1940-10-01, 72 y.o.   MRN: [id number]  CLAGGETT,ELIN, PA-C  HPI VOMITING BETTER. SPRINKLED KETCHUP ON EGGS. TAKING REGLAN 4X/DAY. BACK IN IN DEC/JAN/FEB. TAKING PROTONIX ONCE A DAY.FEELS LIKE SHE HAS BETTER CONTROL OF REFLUX IF SHE TAKES BID. NO AGITATION, DRAINAGE FROM HER BREAST, TARDIVE DYSKINESIA, OR CHANGES IN VISION. CONCRENDD ABOUT HER FATTY LIVERS. DOESN'T REALLY GET NAUSEOUS IF SHE EATS RIGHT. BmS: QOD AND MAY QD. LOTS OF NV AFTER PROCEDURE AND FELT LIKE PROTONIX MADE IT BETTER. HEARTBURN AND INDIGESTION CONTROLLED IF SHE TAKES PROTONIX BID. SON WITH HER DURING HER VISIT. DAUGHTER HAD ANOTHER APPT.  PT DENIES FEVER, CHILLS, BRBPR, melena, diarrhea, constipation, abd pain, problems swallowing, problems with sedation, heartburn or indigestion.  Past Medical History  Diagnosis Date  . Diabetes     diet controlled  . GERD (gastroesophageal reflux disease)     Esophagus dilated in the 1990s per patient  . Abnormal LFTs   . Hypertension   . Hyperlipidemia   . CAD (coronary artery disease)    Past Surgical History  Procedure Laterality Date  . Abdominal hysterectomy    . Esophageal dilation      In the 1990s  . Appendectomy    . Colonoscopy with esophagogastroduodenoscopy (egd)  02/15/2012    OIN:OMVEH sessile polyps 3-8 mm found at the cecum/ 8 polyps measuring 3-8 mm in/1 polyp measuring 6 mm in transverse colon/3-35m polyps found in the sigmoid/6 mm polyp found in the rectum/small internal hemorrhoids  . 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 Cardiac catheterization  2009    with stent   No Known Allergies  Current Outpatient Prescriptions  Medication Sig Dispense Refill  . amLODipine (NORVASC) 10 MG tablet Take 10 mg by mouth daily.      .Marland Kitchenaspirin 81 MG  chewable tablet Chew 81 mg by mouth daily.      . clopidogrel (PLAVIX) 75 MG tablet Take 75 mg by mouth daily.      . fish oil-omega-3 fatty acids 1000 MG capsule Take 1 g by mouth 2 (two) times daily.       .Marland KitchenFLUoxetine (PROZAC) 40 MG capsule Take 80 mg by mouth 2 (two) times daily. 2 pills in the morning 2 at night      . ibuprofen (ADVIL,MOTRIN) 200 MG tablet Take 600 mg by mouth every 6 (six) hours as needed. Pain      . lisinopril (PRINIVIL,ZESTRIL) 2.5 MG tablet Take 2.5 mg by mouth daily.      . metoCLOPramide (REGLAN) 10 MG tablet Take 0.5 tablets (5 mg total) by mouth 4 (four) times daily. QAC AND HS    . metoprolol succinate (TOPROL-XL) 100 MG 24 hr tablet Take 100 mg by mouth daily. Take with or immediately following a meal.    . naproxen (NAPROSYN) 500 MG tablet Take 500 mg by mouth 2 (two) times daily with a meal.    . nitrofurantoin, macrocrystal-monohydrate, (MACROBID) 100 MG capsule Take 100 mg by mouth 2 (two) times daily.    . pantoprazole (PROTONIX) 40 MG tablet Take 40 mg by mouth daily.    . traZODone (DESYREL) 50 MG tablet Take 50 mg by mouth at bedtime.    . ondansetron (ZOFRAN) 4 MG tablet Take 1 tablet (4 mg total) by mouth every 6 (six)  hours as needed for nausea.    . promethazine (PHENERGAN) 12.5 MG tablet Take 1 tablet (12.5 mg total) by mouth every 6 (six) hours as needed for nausea.         Review of Systems     Objective:   Physical Exam  Vitals reviewed. Constitutional: She is oriented to person, place, and time. She appears well-nourished. No distress.  HENT:  Head: Normocephalic and atraumatic.  Mouth/Throat: Oropharynx is clear and moist. No oropharyngeal exudate.  Eyes: Pupils are equal, round, and reactive to light. No scleral icterus.  Neck: Normal range of motion. Neck supple.  Cardiovascular: Normal rate, regular rhythm and normal heart sounds.   Pulmonary/Chest: Effort normal and breath sounds normal. No respiratory distress.  Abdominal:  Soft. Bowel sounds are normal. She exhibits no distension. There is no tenderness.  Musculoskeletal: She exhibits no edema.  Lymphadenopathy:    She has no cervical adenopathy.  Neurological: She is alert and oriented to person, place, and time.  NO FOCAL DEFICITS   Psychiatric: She has a normal mood and affect.          Assessment & Plan:

## 2012-08-10 NOTE — Patient Instructions (Signed)
FOLLOW A LOW FAT DIET. SEE INFO BELOW.  CONTINUE PROTONIX. TAKE 30 MINUTES PRIOR TO MEALS TWICE DAILY.  REDUCE PROZAC TO 1 PILL TWICE DAILY.  CONTINUE REGLAN.  FOLLOW UP IN 4 MOS.    Low-Fat Diet BREADS, CEREALS, PASTA, RICE, DRIED PEAS, AND BEANS These products are high in carbohydrates and most are low in fat. Therefore, they can be increased in the diet as substitutes for fatty foods. They too, however, contain calories and should not be eaten in excess. Cereals can be eaten for snacks as well as for breakfast.   FRUITS AND VEGETABLES It is good to eat fruits and vegetables. Besides being sources of fiber, both are rich in vitamins and some minerals. They help you get the daily allowances of these nutrients. Fruits and vegetables can be used for snacks and desserts.  MEATS Limit lean meat, chicken, [place], and fish to no more than 6 ounces per day. Beef, Pork, and Lamb Use lean cuts of beef, pork, and lamb. Lean cuts include:  Extra-lean ground beef.  Arm roast.  Sirloin tip.  Center-cut ham.  Round steak.  Loin chops.  Rump roast.  Tenderloin.  Trim all fat off the outside of meats before cooking. It is not necessary to severely decrease the intake of red meat, but lean choices should be made. Lean meat is rich in protein and contains a highly absorbable form of iron. Premenopausal women, in particular, should avoid reducing lean red meat because this could increase the risk for low red blood cells (iron-deficiency anemia).  Chicken and [place] These are good sources of protein. The fat of poultry can be reduced by removing the skin and underlying fat layers before cooking. Chicken and [place] can be substituted for lean red meat in the diet. Poultry should not be fried or covered with high-fat sauces. Fish and Shellfish Fish is a good source of protein. Shellfish contain cholesterol, but they usually are low in saturated fatty acids. The preparation of fish is important. Like  chicken and [place], they should not be fried or covered with high-fat sauces. EGGS Egg whites contain no fat or cholesterol. They can be eaten often. Try 1 to 2 egg whites instead of whole eggs in recipes or use egg substitutes that do not contain yolk. MILK AND DAIRY PRODUCTS Use skim or 1% milk instead of 2% or whole milk. Decrease whole milk, natural, and processed cheeses. Use nonfat or low-fat (2%) cottage cheese or low-fat cheeses made from vegetable oils. Choose nonfat or low-fat (1 to 2%) yogurt. Experiment with evaporated skim milk in recipes that call for heavy cream. Substitute low-fat yogurt or low-fat cottage cheese for sour cream in dips and salad dressings. Have at least 2 servings of low-fat dairy products, such as 2 glasses of skim (or 1%) milk each day to help get your daily calcium intake. FATS AND OILS Reduce the total intake of fats, especially saturated fat. Butterfat, lard, and beef fats are high in saturated fat and cholesterol. These should be avoided as much as possible. Vegetable fats do not contain cholesterol, but certain vegetable fats, such as coconut oil, palm oil, and palm kernel oil are very high in saturated fats. These should be limited. These fats are often used in bakery goods, processed foods, popcorn, oils, and nondairy creamers. Vegetable shortenings and some peanut butters contain hydrogenated oils, which are also saturated fats. Read the labels on these foods and check for saturated vegetable oils. Unsaturated vegetable oils and fats do not  raise blood cholesterol. However, they should be limited because they are fats and are high in calories. Total fat should still be limited to 30% of your daily caloric intake. Desirable liquid vegetable oils are corn oil, cottonseed oil, olive oil, canola oil, safflower oil, soybean oil, and sunflower oil. Peanut oil is not as good, but small amounts are acceptable. Buy a heart-healthy tub margarine that has no partially  hydrogenated oils in the ingredients. Mayonnaise and salad dressings often are made from unsaturated fats, but they should also be limited because of their high calorie and fat content. Seeds, nuts, peanut butter, olives, and avocados are high in fat, but the fat is mainly the unsaturated type. These foods should be limited mainly to avoid excess calories and fat. OTHER EATING TIPS Snacks  Most sweets should be limited as snacks. They tend to be rich in calories and fats, and their caloric content outweighs their nutritional value. Some good choices in snacks are graham crackers, melba toast, soda crackers, bagels (no egg), English muffins, fruits, and vegetables. These snacks are preferable to snack crackers, [place] fries, TORTILLA CHIPS, and POTATO chips. Popcorn should be air-popped or cooked in small amounts of liquid vegetable oil. Desserts Eat fruit, low-fat yogurt, and fruit ices instead of pastries, cake, and cookies. Sherbet, angel food cake, gelatin dessert, frozen low-fat yogurt, or other frozen products that do not contain saturated fat (pure fruit juice bars, frozen ice pops) are also acceptable.  COOKING METHODS Choose those methods that use little or no fat. They include: Poaching.  Braising.  Steaming.  Grilling.  Baking.  Stir-frying.  Broiling.  Microwaving.  Foods can be cooked in a nonstick pan without added fat, or use a nonfat cooking spray in regular cookware. Limit fried foods and avoid frying in saturated fat. Add moisture to lean meats by using water, broth, cooking wines, and other nonfat or low-fat sauces along with the cooking methods mentioned above. Soups and stews should be chilled after cooking. The fat that forms on top after a few hours in the refrigerator should be skimmed off. When preparing meals, avoid using excess salt. Salt can contribute to raising blood pressure in some people.  EATING AWAY FROM HOME Order entres, potatoes, and vegetables without  sauces or butter. When meat exceeds the size of a deck of cards (3 to 4 ounces), the rest can be taken home for another meal. Choose vegetable or fruit salads and ask for low-calorie salad dressings to be served on the side. Use dressings sparingly. Limit high-fat toppings, such as bacon, crumbled eggs, cheese, sunflower seeds, and olives. Ask for heart-healthy tub margarine instead of butter.

## 2012-08-10 NOTE — Assessment & Plan Note (Addendum)
MOST LIKELY DUE TO GERD/?GASTROPARESIS. SX IMPROVED ON BID PPI AND REGLAN.  CONTINUE REGLAN. NO SIDE EFFECTS FROM REGLAN IN SETTING OF SUPRA-THERAPEUTIC DOSING OF PROZAC. DIET MODIFICATION PROTONIX BID OPV I 4 MOS

## 2012-08-10 NOTE — Addendum Note (Signed)
Addended by: Danie Binder on: 08/10/2012 04:03 PM   Modules accepted: Orders

## 2012-08-10 NOTE — Assessment & Plan Note (Addendum)
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  LAST CMP FEB 2014. RECHECK CMP THIS WEEK. OPV IN 4 MOS

## 2012-08-10 NOTE — Progress Notes (Signed)
CC PCP 

## 2012-08-10 NOTE — Assessment & Plan Note (Signed)
NEXT TCS 2016 ALL SISTERS, BROTHERS, CHILDREN, AND PARENTS NEED TO HAVE A COLONOSCOPY WHEN THEY REACH THE AGE OF 40.

## 2012-08-10 NOTE — Assessment & Plan Note (Signed)
SX NOT IDEALLY CONTROLLED ON PROTONIX QD.  DIET MODIFICATION INCREASE PROTONIX TO BID. PT FAILED QD THERAPY. OPV IN 4 MOS

## 2012-08-11 LAB — COMPLETE METABOLIC PANEL WITH GFR
ALT: 94 U/L — ABNORMAL HIGH (ref 0–35)
AST: 69 U/L — ABNORMAL HIGH (ref 0–37)
Alkaline Phosphatase: 81 U/L (ref 39–117)
Potassium: 3.8 mEq/L (ref 3.5–5.3)
Sodium: 140 mEq/L (ref 135–145)
Total Bilirubin: 1.2 mg/dL (ref 0.3–1.2)
Total Protein: 6.5 g/dL (ref 6.0–8.3)

## 2012-08-12 NOTE — Addendum Note (Signed)
Addended by: Danie Binder on: 08/12/2012 11:57 AM   Modules accepted: Orders

## 2012-08-12 NOTE — Progress Notes (Signed)
PLEASE CALL PT. HER LIVER ENZYMES ARE STILL MILDLY ELEVATED. SHE NEEDS TO HAVE ADDITIONAL BLOOD WORK.

## 2012-08-15 NOTE — Progress Notes (Signed)
Called and informed pt. Lab order faxed to [facility].

## 2012-08-18 ENCOUNTER — Telehealth: Payer: Self-pay

## 2012-08-18 NOTE — Telephone Encounter (Signed)
Pt called and asked that her lab orders be faxed to [facility] and she will go do it tomorrow. I faxed them.

## 2012-08-22 NOTE — Progress Notes (Signed)
Reminder in epic °

## 2012-09-09 ENCOUNTER — Telehealth: Payer: Self-pay

## 2012-09-09 DIAGNOSIS — R945 Abnormal results of liver function studies: Secondary | ICD-10-CM

## 2012-09-09 NOTE — Telephone Encounter (Signed)
Pt called and asked had we heard from her labs she had drawn at [facility] about 3 weeks ago. I told her I have not heard. I called [facility] and spoke to Revillo in the lab and she said she was the only one in lab at this time and she was swamped and she will fax those results to me later. I called the pt and told her that we leave at noon today, [facility] is supposed to fax the results and I will follow up on them on Mon.

## 2012-09-12 ENCOUNTER — Other Ambulatory Visit: Payer: Self-pay

## 2012-09-12 DIAGNOSIS — R945 Abnormal results of liver function studies: Secondary | ICD-10-CM

## 2012-09-12 NOTE — Telephone Encounter (Signed)
PLEASE CALL PT. I REVIEWED HER RESULTS.  SHE DOES NOT HAVE AUTOIMMUNE HEPATITIS. HER IMMUNOGLOBULINS ARE A LITTLE LOW. THEY ARE PROTEINS THAT HELP FIGHT INFECTION. SHE SHOULD SPEAK WITH DR. Jaclyn Prime OFC TO SEE IF SHE NEEDS AN ADDITIONAL EVALUATION FOR HER LOW LEVELS OF IgG AND IgM.   OPV OCT 2014 WITH REPEAT LIVER ENZYMES 1 WEEK PRIOR TO HER VISIT.

## 2012-09-12 NOTE — Telephone Encounter (Signed)
Received the lab results and faxed to Dr. Oneida Alar in Endo to review.

## 2012-09-12 NOTE — Telephone Encounter (Signed)
I faxed the paper copies and will the reports scanned in.

## 2012-09-12 NOTE — Telephone Encounter (Signed)
LMOM for a return call. Lab order on file for Oct 2014.

## 2012-09-12 NOTE — Telephone Encounter (Signed)
Results faxed to Dr. Tressie Stalker

## 2012-09-12 NOTE — Telephone Encounter (Signed)
FAX RESULTS TO DR. Tressie Stalker.

## 2012-09-12 NOTE — Assessment & Plan Note (Signed)
REPEAT OCT 2014

## 2012-09-12 NOTE — Telephone Encounter (Signed)
Pt called and was informed.

## 2012-09-13 NOTE — Telephone Encounter (Signed)
Reminder in epic °

## 2013-03-22 ENCOUNTER — Other Ambulatory Visit (HOSPITAL_COMMUNITY): Payer: Medicare Other

## 2013-03-27 ENCOUNTER — Ambulatory Visit (HOSPITAL_COMMUNITY): Payer: Medicare Other

## 2013-03-28 NOTE — Progress Notes (Signed)
This encounter was created in error - please disregard.

## 2013-03-30 ENCOUNTER — Encounter (HOSPITAL_COMMUNITY): Payer: Self-pay

## 2013-07-06 ENCOUNTER — Other Ambulatory Visit: Payer: Self-pay | Admitting: Gastroenterology

## 2013-11-17 ENCOUNTER — Ambulatory Visit (INDEPENDENT_AMBULATORY_CARE_PROVIDER_SITE_OTHER): Payer: Medicare Other | Admitting: Cardiovascular Disease

## 2013-11-17 ENCOUNTER — Encounter: Payer: Self-pay | Admitting: Cardiovascular Disease

## 2013-11-17 VITALS — BP 126/72 | HR 78 | Ht 65.0 in | Wt 165.0 lb

## 2013-11-17 DIAGNOSIS — I10 Essential (primary) hypertension: Secondary | ICD-10-CM | POA: Insufficient documentation

## 2013-11-17 DIAGNOSIS — I2510 Atherosclerotic heart disease of native coronary artery without angina pectoris: Secondary | ICD-10-CM

## 2013-11-17 DIAGNOSIS — R002 Palpitations: Secondary | ICD-10-CM

## 2013-11-17 DIAGNOSIS — F410 Panic disorder [episodic paroxysmal anxiety] without agoraphobia: Secondary | ICD-10-CM

## 2013-11-17 DIAGNOSIS — E785 Hyperlipidemia, unspecified: Secondary | ICD-10-CM

## 2013-11-17 DIAGNOSIS — K219 Gastro-esophageal reflux disease without esophagitis: Secondary | ICD-10-CM

## 2013-11-17 NOTE — Patient Instructions (Signed)
Your physician wants you to follow-up in: 6 months You will receive a reminder letter in the mail two months in advance. If you don't receive a letter, please call our office to schedule the follow-up appointment.     Your physician has recommended you make the following change in your medication:     STOP Plavix     Your physician has requested that you have an echocardiogram. Echocardiography is a painless test that uses sound waves to create images of your heart. It provides your doctor with information about the size and shape of your heart and how well your heart's chambers and valves are working. This procedure takes approximately one hour. There are no restrictions for this procedure.       Thank you for choosing [facility] !

## 2013-11-17 NOTE — Progress Notes (Signed)
Patient ID: Carmen [name], female   DOB: 08-20-1940, 73 y.o.   MRN: [id number]       CARDIOLOGY CONSULT NOTE  Patient ID: Carmen [name] MRN: [id number] DOB/AGE: 10/15/1940 73 y.o.  Admit date: (Not on file) Primary Physician [facility], PA-C  Reason for Consultation: CAD  HPI: The patient is a 73 year old woman with a history of coronary artery disease with prior stent placement (mid LAD with 3 x 15 mm Promus DES) on 09-15-07 in [place] ([place]), hypertension and GERD who presents for initial evaluation. She has not seen a cardiologist in at least 4 years. She remains on aspirin and Plavix. She has a history of elevated LFTs in the past. She denies exertional chest discomfort and exertional dyspnea. She occasionally has palpitations which are not particularly bothersome. She denies orthopnea, leg swelling and paroxysmal nocturnal dyspnea. She is here with her granddaughter, [name]. She lives by herself and is able to perform her activities of daily living without limitations. Her family doctor is [facility] ECG performed in the office today demonstrates normal sinus rhythm.  Soc: Nonsmoker. Lives alone.    No Known Allergies  Current Outpatient Prescriptions  Medication Sig Dispense Refill  . amLODipine (NORVASC) 10 MG tablet Take 10 mg by mouth daily.      Marland Kitchen aspirin 81 MG chewable tablet Chew 81 mg by mouth daily.      . clopidogrel (PLAVIX) 75 MG tablet Take 75 mg by mouth daily.      . fish oil-omega-3 fatty acids 1000 MG capsule Take 1 g by mouth 2 (two) times daily.       Marland Kitchen ibuprofen (ADVIL,MOTRIN) 200 MG tablet Take 600 mg by mouth every 6 (six) hours as needed. Pain      . lisinopril (PRINIVIL,ZESTRIL) 2.5 MG tablet Take 2.5 mg by mouth daily.      . metoCLOPramide (REGLAN) 10 MG tablet 1/2 PO QAC AND HS  124 tablet  11  . metoprolol succinate (TOPROL-XL) 100 MG 24 hr tablet Take 100 mg by mouth daily. Take with or immediately following a meal.      .  naproxen (NAPROSYN) 500 MG tablet Take 500 mg by mouth 2 (two) times daily with a meal.      . nitrofurantoin, macrocrystal-monohydrate, (MACROBID) 100 MG capsule Take 100 mg by mouth 2 (two) times daily.      . ondansetron (ZOFRAN) 4 MG tablet Take 1 tablet (4 mg total) by mouth every 6 (six) hours as needed for nausea.  30 tablet  0  . pantoprazole (PROTONIX) 40 MG tablet TAKE 1 TABLET BY MOUTH TWICE DAILY. TAKE 30 MINS PRIOR TO MEALS  60 tablet  5  . promethazine (PHENERGAN) 12.5 MG tablet Take 1 tablet (12.5 mg total) by mouth every 6 (six) hours as needed for nausea.  6 tablet  0  . traZODone (DESYREL) 50 MG tablet Take 50 mg by mouth at bedtime.       No current facility-administered medications for this visit.    Past Medical History  Diagnosis Date  . Diabetes     diet controlled  . GERD (gastroesophageal reflux disease)     Esophagus dilated in the 1990s per patient  . Abnormal LFTs   . Hypertension   . Hyperlipidemia   . CAD (coronary artery disease)     Past Surgical History  Procedure Laterality Date  . Abdominal hysterectomy    . Esophageal dilation      In the 1990s  .  Appendectomy    . Colonoscopy with esophagogastroduodenoscopy (egd)  02/15/2012    KPT:WSFKC sessile polyps 3-8 mm found at the cecum/ 8 polyps measuring 3-8 mm in/1 polyp measuring 6 mm in transverse colon/3-12m polyps found in the sigmoid/6 mm polyp found in the rectum/small internal hemorrhoids  . 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 Cardiac catheterization  2009    with stent  . Cataract extraction      left    History   Social History  . Marital Status: Single    Spouse Name: N/A    Number of Children: 3  . Years of Education: N/A   Occupational History  . Not on file.   Social History Main Topics  . Smoking status: Never Smoker    . Smokeless tobacco: Not on file  . Alcohol Use: No  . Drug Use: No  . Sexual Activity: Yes    Birth Control/ Protection: Surgical   Other Topics Concern  . Not on file   Social History Narrative  . No narrative on file     No family history of premature CAD in 1st degree relatives.  Prior to Admission medications   Medication Sig Start Date End Date Taking? Authorizing Provider  amLODipine (NORVASC) 10 MG tablet Take 10 mg by mouth daily.    Historical Provider, MD  aspirin 81 MG chewable tablet Chew 81 mg by mouth daily.    Historical Provider, MD  clopidogrel (PLAVIX) 75 MG tablet Take 75 mg by mouth daily.    Historical Provider, MD  fish oil-omega-3 fatty acids 1000 MG capsule Take 1 g by mouth 2 (two) times daily.     Historical Provider, MD  ibuprofen (ADVIL,MOTRIN) 200 MG tablet Take 600 mg by mouth every 6 (six) hours as needed. Pain    Historical Provider, MD  lisinopril (PRINIVIL,ZESTRIL) 2.5 MG tablet Take 2.5 mg by mouth daily.    Historical Provider, MD  metoCLOPramide (REGLAN) 10 MG tablet 1/2 PO QAC AND HS 08/10/12   SDanie Binder MD  metoprolol succinate (TOPROL-XL) 100 MG 24 hr tablet Take 100 mg by mouth daily. Take with or immediately following a meal.    Historical Provider, MD  naproxen (NAPROSYN) 500 MG tablet Take 500 mg by mouth 2 (two) times daily with a meal.    Historical Provider, MD  nitrofurantoin, macrocrystal-monohydrate, (MACROBID) 100 MG capsule Take 100 mg by mouth 2 (two) times daily.    Historical Provider, MD  ondansetron (ZOFRAN) 4 MG tablet Take 1 tablet (4 mg total) by mouth every 6 (six) hours as needed for nausea. 04/18/12   KAndria Meuse NP  pantoprazole (PROTONIX) 40 MG tablet TAKE 1 TABLET BY MOUTH TWICE DAILY. TAKE 30 MINS PRIOR TO MEALS 07/06/13   AOrvil Feil NP  promethazine (PHENERGAN) 12.5 MG tablet Take 1 tablet (12.5 mg total) by mouth every 6 (six) hours as needed for nausea. 04/04/12   IJanice Norrie MD  traZODone (DESYREL) 50 MG  tablet Take 50 mg by mouth at bedtime.    Historical Provider, MD     Review of systems complete and found to be negative unless listed above in HPI   BP 126/72 Pulse 78 Resp Temp Temp src SpO2 Weight 165 lb (74.844 kg) Height 5' 5"  (1.651 m)    Physical exam Height 5' 5"  (1.651 m), weight 165  lb (74.844 kg). General: NAD Neck: No JVD, no thyromegaly or thyroid nodule.  Lungs: Clear to auscultation bilaterally with normal respiratory effort. CV: Nondisplaced PMI. Regular rate and rhythm, normal S1/S2, no S3/S4, no murmur.  No peripheral edema.  No carotid bruit.  Normal pedal pulses.  Abdomen: Soft, nontender, no hepatosplenomegaly, no distention.  Skin: Intact without lesions or rashes.  Neurologic: Alert and oriented x 3.  Psych: Normal affect. Extremities: No clubbing or cyanosis.  HEENT: Normal.   ECG: Most recent ECG reviewed.  Labs:   Lab Results  Component Value Date   WBC 9.0 04/04/2012   HGB 13.5 04/04/2012   HCT 41.4 04/04/2012   MCV 81.8 04/04/2012   PLT 193 04/04/2012   No results found for this basename: NA, K, CL, CO2, BUN, CREATININE, CALCIUM, LABALBU, PROT, BILITOT, ALKPHOS, ALT, AST, GLUCOSE,  in the last 168 hours No results found for this basename: CKTOTAL, CKMB, CKMBINDEX, TROPONINI    No results found for this basename: CHOL   No results found for this basename: HDL   No results found for this basename: LDLCALC   No results found for this basename: TRIG   No results found for this basename: CHOLHDL   No results found for this basename: LDLDIRECT         Studies: No results found.  ASSESSMENT AND PLAN:  1. CAD: Stable ischemic heart disease. Currently on ASA, Plavix and metoprolol, and not on a statin, presumably due to h/o elevated LFT's. Takes fish oil. Will obtain echocardiogram for baseline LV function assessment. Will d/c Plavix. 2. Essential HTN: Controlled on present therapy which includes amlodipine 10 mg and lisinopril 2.5 mg. 3. GERD: On  Protonix and Phenergan. 4. Hyperlipidemia: Will check to see if lipids were recently obtained. 5. 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  Dispo: f/u 6 months.  Signed: Kate Sable, M.D., F.A.C.C.  11/17/2013, 8:27 AM

## 2013-11-20 ENCOUNTER — Ambulatory Visit (HOSPITAL_COMMUNITY)
Admission: RE | Admit: 2013-11-20 | Discharge: 2013-11-20 | Disposition: A | Discharge status: Home / Self Care | Payer: Medicare Other | Source: Ambulatory Visit | Attending: Cardiovascular Disease | Admitting: Cardiovascular Disease

## 2013-11-20 ENCOUNTER — Telehealth: Payer: Self-pay | Admitting: *Deleted

## 2013-11-20 DIAGNOSIS — E785 Hyperlipidemia, unspecified: Secondary | ICD-10-CM | POA: Diagnosis not present

## 2013-11-20 DIAGNOSIS — R002 Palpitations: Secondary | ICD-10-CM | POA: Insufficient documentation

## 2013-11-20 DIAGNOSIS — I2510 Atherosclerotic heart disease of native coronary artery without angina pectoris: Secondary | ICD-10-CM

## 2013-11-20 DIAGNOSIS — I079 Rheumatic tricuspid valve disease, unspecified: Secondary | ICD-10-CM | POA: Insufficient documentation

## 2013-11-20 DIAGNOSIS — I10 Essential (primary) hypertension: Secondary | ICD-10-CM | POA: Diagnosis not present

## 2013-11-20 DIAGNOSIS — E119 Type 2 diabetes mellitus without complications: Secondary | ICD-10-CM | POA: Diagnosis not present

## 2013-11-20 DIAGNOSIS — I517 Cardiomegaly: Secondary | ICD-10-CM

## 2013-11-20 DIAGNOSIS — I059 Rheumatic mitral valve disease, unspecified: Secondary | ICD-10-CM | POA: Insufficient documentation

## 2013-11-20 NOTE — Telephone Encounter (Signed)
Notified pt, forwarded to Dr. Shawn Stall

## 2013-11-20 NOTE — Progress Notes (Signed)
  Echocardiogram 2D Echocardiogram has been performed.  [place], [place] 11/20/2013, 9:31 AM

## 2013-11-20 NOTE — Telephone Encounter (Signed)
Message copied by Desma Mcgregor on Mon Nov 20, 2013  4:09 PM ------      Message from: Kate Sable A      Created: Mon Nov 20, 2013  2:12 PM       Normal pumping function. ------

## 2014-05-16 ENCOUNTER — Encounter: Payer: Self-pay | Admitting: Cardiovascular Disease

## 2014-05-16 ENCOUNTER — Ambulatory Visit (INDEPENDENT_AMBULATORY_CARE_PROVIDER_SITE_OTHER): Payer: Medicare Other | Admitting: Cardiovascular Disease

## 2014-05-16 ENCOUNTER — Ambulatory Visit: Payer: Medicare Other | Admitting: Cardiovascular Disease

## 2014-05-16 VITALS — BP 118/72 | HR 85 | Ht 66.0 in | Wt 164.0 lb

## 2014-05-16 DIAGNOSIS — R002 Palpitations: Secondary | ICD-10-CM | POA: Diagnosis not present

## 2014-05-16 DIAGNOSIS — I517 Cardiomegaly: Secondary | ICD-10-CM

## 2014-05-16 DIAGNOSIS — I2510 Atherosclerotic heart disease of native coronary artery without angina pectoris: Secondary | ICD-10-CM | POA: Diagnosis not present

## 2014-05-16 DIAGNOSIS — E785 Hyperlipidemia, unspecified: Secondary | ICD-10-CM

## 2014-05-16 DIAGNOSIS — I10 Essential (primary) hypertension: Secondary | ICD-10-CM

## 2014-05-16 DIAGNOSIS — K219 Gastro-esophageal reflux disease without esophagitis: Secondary | ICD-10-CM

## 2014-05-16 DIAGNOSIS — F410 Panic disorder [episodic paroxysmal anxiety] without agoraphobia: Secondary | ICD-10-CM

## 2014-05-16 NOTE — Progress Notes (Signed)
Patient ID: Carmen [name], female   DOB: 12-May-1940, 74 y.o.   MRN: [id number]      SUBJECTIVE: The patient presents for routine follow-up of coronary artery disease with LAD stents. Echocardiogram on 11/20/13 demonstrated normal left ventricular systolic function, EF 78-58%, grade 1 diastolic dysfunction, and severe left atrial enlargement. On two separate days she had some retrosternal chest discomfort which she thought may be related to heartburn. She did not take nitroglycerin. Both episodes spontaneously resolved. She experiences palpitations approximately once per week and each episode is relieved with deep breathing and no episodes have been sustained or associated with shortness of breath. She denies leg swelling.   Review of Systems: As per "subjective", otherwise negative.  No Known Allergies  Current Outpatient Prescriptions  Medication Sig Dispense Refill  . amLODipine (NORVASC) 10 MG tablet Take 10 mg by mouth daily.    Marland Kitchen aspirin 81 MG chewable tablet Chew 81 mg by mouth daily.    . busPIRone (BUSPAR) 15 MG tablet Take 15 mg by mouth daily. Take 1/2 tablet daily    . fish oil-omega-3 fatty acids 1000 MG capsule Take 1 g by mouth 2 (two) times daily.     Marland Kitchen glipiZIDE (GLUCOTROL) 5 MG tablet Take 5 mg by mouth 2 (two) times daily before a meal.    . ibuprofen (ADVIL,MOTRIN) 200 MG tablet Take 600 mg by mouth every 6 (six) hours as needed. Pain    . lisinopril (PRINIVIL,ZESTRIL) 2.5 MG tablet Take 2.5 mg by mouth daily.    Marland Kitchen LORazepam (ATIVAN) 0.5 MG tablet Take 0.5 mg by mouth 2 (two) times daily.    . metFORMIN (GLUCOPHAGE) 500 MG tablet Take 500 mg by mouth 2 (two) times daily with a meal.    . metoprolol succinate (TOPROL-XL) 100 MG 24 hr tablet Take 50 mg by mouth daily. Take with or immediately following a meal.    . mirtazapine (REMERON) 30 MG tablet Take 30 mg by mouth at bedtime.    . pantoprazole (PROTONIX) 40 MG tablet TAKE 1 TABLET BY MOUTH TWICE DAILY. TAKE 30 MINS  PRIOR TO MEALS 60 tablet 5   No current facility-administered medications for this visit.    Past Medical History  Diagnosis Date  . Diabetes     diet controlled  . GERD (gastroesophageal reflux disease)     Esophagus dilated in the 1990s per patient  . Abnormal LFTs   . Hypertension   . Hyperlipidemia   . CAD (coronary artery disease)     Past Surgical History  Procedure Laterality Date  . Abdominal hysterectomy    . Esophageal dilation      In the 1990s  . Appendectomy    . Colonoscopy with esophagogastroduodenoscopy (egd)  02/15/2012    IFO:YDXAJ sessile polyps 3-8 mm found at the cecum/ 8 polyps measuring 3-8 mm in/1 polyp measuring 6 mm in transverse colon/3-75m polyps found in the sigmoid/6 mm polyp found in the rectum/small internal hemorrhoids  . 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 Cardiac catheterization  2009    with stent  . Cataract extraction      left    History   Social History  . Marital Status: Single    Spouse Name: N/A  . Number of Children: 3  . Years of Education: N/A   Occupational History  . Not on file.  Social History Main Topics  . Smoking status: Never Smoker   . Smokeless tobacco: Never Used  . Alcohol Use: No  . Drug Use: No  . Sexual Activity: Yes    Birth Control/ Protection: Surgical   Other Topics Concern  . Not on file   Social History Narrative     Filed Vitals:   05/16/14 1118  BP: 118/72  Pulse: 85  Height: 5' 6"  (1.676 m)  Weight: 164 lb (74.39 kg)  SpO2: 94%    PHYSICAL EXAM General: NAD HEENT: Normal. Neck: No JVD, no thyromegaly. Lungs: Clear to auscultation bilaterally with normal respiratory effort. CV: Nondisplaced PMI.  Regular rate and rhythm, normal S1/S2, no S3/S4, no murmur. No pretibial or periankle edema.  No carotid bruit.  Normal pedal  pulses.  Abdomen: Soft, nontender, no hepatosplenomegaly, no distention.  Neurologic: Alert and oriented x 3.  Psych: Normal affect. Skin: Normal. Musculoskeletal: Normal range of motion, no gross deformities. Extremities: No clubbing or cyanosis.   ECG: Most recent ECG reviewed.      ASSESSMENT AND PLAN: 1. CAD: Stable ischemic heart disease. Currently on ASA and metoprolol, and not on a statin, presumably due to h/o elevated LFT's. Takes fish oil.  2. Essential HTN: Controlled on present therapy which includes amlodipine 10 mg and lisinopril 2.5 mg. No changes. 3. GERD: On Protonix and Phenergan. 4. Hyperlipidemia: Will obtain a copy of her lipid panel. 5. 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 6. Severe left atrial enlargement with palpitations: At risk for development of atrial fibrillation given history of CAD and hypertension, along with age and gender. Will monitor.  Dispo: f/u 6 months.   Kate Sable, M.D., F.A.C.C.

## 2014-05-16 NOTE — Patient Instructions (Signed)
Your physician wants you to follow-up in: 6 month with Dr. Dwana Curd.  You will receive a reminder letter in the mail two months in advance. If you don't receive a letter, please call our office to schedule the follow-up appointment.  Your physician recommends that you continue on your current medications as directed. Please refer to the Current Medication list given to you today.  Thank you for choosing [facility]!

## 2014-05-16 NOTE — Addendum Note (Signed)
Addended by: Levonne Hubert on: 05/16/2014 12:08 PM   Modules accepted: Level of Service

## 2014-05-18 ENCOUNTER — Telehealth: Payer: Self-pay

## 2014-05-18 DIAGNOSIS — E785 Hyperlipidemia, unspecified: Secondary | ICD-10-CM

## 2014-05-18 NOTE — Telephone Encounter (Signed)
-----   Message from Herminio Commons, MD sent at 05/18/2014  9:16 AM EDT ----- Yes. Thx.  ----- Message -----    From: Levonne Hubert, LPN    Sent: 10/14/9468  11:09 AM      To: Herminio Commons, MD  [facility] states that Ms. Hundertmark has not has Lipid done in a year. Would you like me to order?

## 2014-05-18 NOTE — Telephone Encounter (Signed)
Mailed lab slip for lipids to patient

## 2014-11-30 ENCOUNTER — Ambulatory Visit (INDEPENDENT_AMBULATORY_CARE_PROVIDER_SITE_OTHER): Payer: Medicare Other | Admitting: Cardiovascular Disease

## 2014-11-30 ENCOUNTER — Encounter: Payer: Self-pay | Admitting: Cardiovascular Disease

## 2014-11-30 VITALS — BP 126/66 | HR 82 | Ht 66.0 in | Wt 161.0 lb

## 2014-11-30 DIAGNOSIS — E785 Hyperlipidemia, unspecified: Secondary | ICD-10-CM

## 2014-11-30 DIAGNOSIS — I517 Cardiomegaly: Secondary | ICD-10-CM

## 2014-11-30 DIAGNOSIS — I10 Essential (primary) hypertension: Secondary | ICD-10-CM

## 2014-11-30 DIAGNOSIS — I2510 Atherosclerotic heart disease of native coronary artery without angina pectoris: Secondary | ICD-10-CM

## 2014-11-30 NOTE — Progress Notes (Signed)
Patient ID: Carmen [name], female   DOB: 1940-08-27, 74 y.o.   MRN: [id number]      SUBJECTIVE: The patient presents for routine follow-up of coronary artery disease with LAD stents. Echocardiogram on 11/20/13 demonstrated normal left ventricular systolic function, EF 27-25%, grade 1 diastolic dysfunction, and severe left atrial enlargement.  The patient denies any symptoms of chest pain, palpitations, shortness of breath, lightheadedness, dizziness, leg swelling, orthopnea, PND, and syncope.  She stays active at the senior center in [place].  She is here with her daughter who is an Therapist, sports.  ECG performed in the office today demonstrates normal sinus rhythm with no ischemic ST segment or T-wave abnormalities, nor any arrhythmias.  Review of labs performed on 11/17/14 showed BUN 15, creatinine 0.84, AST 29, ALT 40, total cholesterol 183, HDL 31, triglycerides 218, LDL 1086, magnesium 1.8.  She prefers to remain off statin therapy, and wants to begin walking more.   Review of Systems: As per "subjective", otherwise negative.  Allergies  Allergen Reactions  . Sulfa Antibiotics Nausea And Vomiting    Current Outpatient Prescriptions  Medication Sig Dispense Refill  . amLODipine (NORVASC) 10 MG tablet Take 10 mg by mouth daily.    Marland Kitchen aspirin 81 MG chewable tablet Chew 81 mg by mouth daily.    . busPIRone (BUSPAR) 15 MG tablet Take 15 mg by mouth daily. Take 1/2 tablet daily    . fish oil-omega-3 fatty acids 1000 MG capsule Take 1 g by mouth 2 (two) times daily.     Marland Kitchen glipiZIDE (GLUCOTROL) 5 MG tablet Take 5 mg by mouth 2 (two) times daily before a meal.    . ibuprofen (ADVIL,MOTRIN) 200 MG tablet Take 600 mg by mouth every 6 (six) hours as needed. Pain    . lisinopril (PRINIVIL,ZESTRIL) 2.5 MG tablet Take 2.5 mg by mouth daily.    Marland Kitchen LORazepam (ATIVAN) 0.5 MG tablet Take 0.5 mg by mouth 2 (two) times daily.    . metFORMIN (GLUCOPHAGE) 500 MG tablet Take 500 mg by mouth 2 (two)  times daily with a meal.    . metoprolol succinate (TOPROL-XL) 100 MG 24 hr tablet Take 50 mg by mouth daily. Take with or immediately following a meal.    . mirtazapine (REMERON) 30 MG tablet Take 30 mg by mouth at bedtime.    . nitrofurantoin (MACRODANTIN) 50 MG capsule Take 50 mg by mouth at bedtime.     . pantoprazole (PROTONIX) 40 MG tablet TAKE 1 TABLET BY MOUTH TWICE DAILY. TAKE 30 MINS PRIOR TO MEALS 60 tablet 5   No current facility-administered medications for this visit.    Past Medical History  Diagnosis Date  . Diabetes     diet controlled  . GERD (gastroesophageal reflux disease)     Esophagus dilated in the 1990s per patient  . Abnormal LFTs   . Hypertension   . Hyperlipidemia   . CAD (coronary artery disease)     Past Surgical History  Procedure Laterality Date  . Abdominal hysterectomy    . Esophageal dilation      In the 1990s  . Appendectomy    . Colonoscopy with esophagogastroduodenoscopy (egd)  02/15/2012    DGU:YQIHK sessile polyps 3-8 mm found at the cecum/ 8 polyps measuring 3-8 mm in/1 polyp measuring 6 mm in transverse colon/3-98m polyps found in the sigmoid/6 mm polyp found in the rectum/small internal hemorrhoids  . Esophageal dilation  02/15/2012    SLF: Stricture was found at the  gastroesophageal/ Non-erosive gastritis (inflammation)Sessile polyp measuring 4 mm/duodenal mucosa showed no abnormalities   . Cardiac catheterization  2009    with stent  . Cataract extraction      left    Social History   Social History  . Marital Status: Single    Spouse Name: N/A  . Number of Children: 3  . Years of Education: N/A   Occupational History  . Not on file.   Social History Main Topics  . Smoking status: Never Smoker   . Smokeless tobacco: Never Used  . Alcohol Use: No  . Drug Use: No  . Sexual Activity: Yes    Birth Control/ Protection: Surgical   Other Topics Concern  . Not on file   Social History Narrative     Filed Vitals:    11/30/14 1254  BP: 126/66  Pulse: 82  Height: 5' 6"  (1.676 m)  Weight: 161 lb (73.029 kg)  SpO2: 96%    PHYSICAL EXAM General: NAD HEENT: Normal. Neck: No JVD, no thyromegaly. Lungs: Clear to auscultation bilaterally with normal respiratory effort. CV: Nondisplaced PMI.  Regular rate and rhythm, normal S1/S2, no S3/S4, no murmur. No pretibial or periankle edema.  No carotid bruit.  Normal pedal pulses.  Abdomen: Soft, nontender, no hepatosplenomegaly, no distention.  Neurologic: Alert and oriented x 3.  Psych: Normal affect. Skin: Normal. Musculoskeletal: Normal range of motion, no gross deformities. Extremities: No clubbing or cyanosis.   ECG: Most recent ECG reviewed.      ASSESSMENT AND PLAN: 1. CAD: Stable ischemic heart disease. Currently on ASA and metoprolol, and not on a statin, as per her preference and presumably due to h/o elevated LFT's as noted above. Takes fish oil.   2. Essential HTN: Controlled on present therapy which includes amlodipine 10 mg and lisinopril 2.5 mg. No changes.  3. GERD: On Protonix and Phenergan.  4. Hyperlipidemia: On fish oil. Most recent results noted above. She prefers to remain off statin therapy, and wants to begin walking more.  5. 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  6. Severe left atrial enlargement with palpitations: At risk for development of atrial fibrillation given history of CAD and hypertension, along with age and gender. Will monitor.  Dispo: f/u 6 months.   Kate Sable, M.D., F.A.C.C.

## 2014-11-30 NOTE — Patient Instructions (Signed)
Your physician wants you to follow-up in: 6 months with Dr.Koneswaran You will receive a reminder letter in the mail two months in advance. If you don't receive a letter, please call our office to schedule the follow-up appointment.   Your physician recommends that you continue on your current medications as directed. Please refer to the Current Medication list given to you today.     Thank you for choosing [facility] !

## 2015-01-15 ENCOUNTER — Encounter: Payer: Self-pay | Admitting: Gastroenterology

## 2015-03-20 ENCOUNTER — Encounter: Payer: Self-pay | Admitting: Gastroenterology

## 2015-03-20 ENCOUNTER — Other Ambulatory Visit: Payer: Self-pay

## 2015-03-20 ENCOUNTER — Ambulatory Visit (INDEPENDENT_AMBULATORY_CARE_PROVIDER_SITE_OTHER): Payer: Medicare Other | Admitting: Gastroenterology

## 2015-03-20 VITALS — BP 126/72 | HR 79 | Temp 97.5°F | Ht 60.0 in | Wt 165.2 lb

## 2015-03-20 DIAGNOSIS — Z86010 Personal history of colonic polyps: Secondary | ICD-10-CM | POA: Diagnosis not present

## 2015-03-20 DIAGNOSIS — R1310 Dysphagia, unspecified: Secondary | ICD-10-CM | POA: Diagnosis not present

## 2015-03-20 DIAGNOSIS — K7581 Nonalcoholic steatohepatitis (NASH): Secondary | ICD-10-CM

## 2015-03-20 MED ORDER — PEG-KCL-NACL-NASULF-NA ASC-C 100 G PO SOLR: 1.0000 | ORAL | Status: DC

## 2015-03-20 NOTE — Progress Notes (Signed)
cc'ed to pcp °

## 2015-03-20 NOTE — Assessment & Plan Note (Signed)
LAST EGD/DIL 2013. DOING WELL UNTIL LAST MO. SYMPTOMS NOT IDEALLY CONTROLLED & MOST LIKELY DUE TO UNCONTROLLED GERD OR PEPTIC STRICTURE , LESS LIKELY ESOPHAGEAL CA OR GE JUNCTION TUMOR.  UPPER ENDOSCOPY TO DILATE YOUR ESOPHAGUS IN 2-3 WEEKS. DISCUSSED PROCEDURE, BENEFITS, & RISKS: < 1% chance of medication reaction, OR bleeding. CONTINUE PROTONIX. TAKE 30 MINUTES PRIOR TO MEALS TWICE DAILY. AVOID REFLUX TRIGGERS.  HANDOUT GIVEN. OUTPATIENT VISIT IN  JUN 2017

## 2015-03-20 NOTE — Progress Notes (Signed)
Subjective:    Patient ID: Carmen [name], female    DOB: 1941-01-01, 75 y.o.   MRN: [id number]  Shade Flood, MD  HPI BEEN HAVING GRABBING CHEST PAIN OFF AND ON(1: SEVERE, 2: MILD). STARTED WITHIN TEH LAST MO. MAY ALSO HAVE REGURGITATION AS WELL. LAST EGD/DIL 2013.ASSOCIATED WITH SOB FOR A FEW MINS AND HAD TO SIT DOWN FOR A WHILE.  ONE DAY SAW BLACK STOOL LIKE SMUT. THINKS IT WAS SOMETHING SHE ATE. IN FEW DAYS IT WENT AWAY. AFTER THAT WENT AWAY WAS TAKING PEPTO BISMOL. DAUGHTER IS AN RN. APPETITE: GREAT. WEIGHT STABLE FOR PAST [phone]). FELT SCOPE IN THROAT FOR A SPLIT SECOND. HEARTBURN: NONE BMs: REGULAR, QD OR QOD DUE TO 1 CUP APPLESAUCE A DAY. NO NAUSEA/VOMITIING SINCE 3 YRS AGO.  PT DENIES FEVER, CHILLS, HEMATOCHEZIA, nausea, vomiting, melena, diarrhea, CHANGE IN BOWEL IN HABITS, constipation, abdominal pain, OR heartburn or indigestion.  Past Medical History  Diagnosis Date  . Diabetes     diet controlled  . GERD (gastroesophageal reflux disease)     Esophagus dilated in the 1990s per patient  . Abnormal LFTs   . Hypertension   . Hyperlipidemia   . CAD (coronary artery disease)     Past Surgical History  Procedure Laterality Date  . Abdominal hysterectomy    . Esophageal dilation      In the 1990s  . Appendectomy    . Colonoscopy with esophagogastroduodenoscopy (egd)  02/15/2012    PPI:RJJOA sessile polyps 3-8 mm found at the cecum/ 8 polyps measuring 3-8 mm in/1 polyp measuring 6 mm in transverse colon/3-37m polyps found in the sigmoid/6 mm polyp found in the rectum/small internal hemorrhoids  . 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 Cardiac catheterization  2009    with stent  . Cataract extraction      left   Allergies  Allergen Reactions  . Sulfa Antibiotics Nausea And Vomiting    Current  Outpatient Prescriptions  Medication Sig Dispense Refill  . amLODipine (NORVASC) 10 MG tablet Take 10 mg by mouth daily.    .Marland Kitchenaspirin 81 MG chewable tablet Chew 81 mg by mouth daily.    . busPIRone (BUSPAR) 15 MG tablet Take 15 mg by mouth daily. Take 1/2 tablet daily    . fish oil-omega-3 fatty acids 1000 MG capsule Take 1 g by mouth 2 (two) times daily.     .Marland KitchenglipiZIDE (GLUCOTROL) 5 MG tablet Take 5 mg by mouth 2 (two) times daily before a meal.    . lisinopril (PRINIVIL,ZESTRIL) 2.5 MG tablet Take 2.5 mg by mouth daily.    .Marland KitchenLORazepam (ATIVAN) 0.5 MG tablet Take 0.5 mg by mouth 2 (two) times daily.    . metFORMIN (GLUCOPHAGE) 500 MG tablet Take 500 mg by mouth 2 (two) times daily with a meal.    . metoprolol succinate (TOPROL-XL) 100 MG 24 hr tablet Take 50 mg by mouth daily. Take with or immediately following a meal.    . mirtazapine (REMERON) 30 MG tablet Take 30 mg by mouth at bedtime.    . nitrofurantoin (MACRODANTIN) 50 MG capsule Take 50 mg by mouth at bedtime.     . pantoprazole (PROTONIX) 40 MG tablet TAKE 1 TABLET BY MOUTH TWICE DAILY. TAKE 30 MINS PRIOR TO MEALS    . ibuprofen (ADVIL,MOTRIN) 200 MG tablet Take 600 mg by mouth  every 6 (six) hours as needed. Reported on 03/20/2015     Review of Systems PER HPI OTHERWISE ALL SYSTEMS ARE NEGATIVE.    Objective:   Physical Exam  Constitutional: She is oriented to person, place, and time. She appears well-developed and well-nourished. No distress.  HENT:  Head: Normocephalic and atraumatic.  Mouth/Throat: Oropharynx is clear and moist. No oropharyngeal exudate.  Eyes: Pupils are equal, round, and reactive to light. No scleral icterus.  Neck: Normal range of motion. Neck supple.  Cardiovascular: Normal rate, regular rhythm and normal heart sounds.   Pulmonary/Chest: Effort normal and breath sounds normal. No respiratory distress.  Abdominal: Soft. Bowel sounds are normal. She exhibits no distension. There is no tenderness.    Musculoskeletal: She exhibits no edema.  Lymphadenopathy:    She has no cervical adenopathy.  Neurological: She is alert and oriented to person, place, and time.  NO  NEW FOCAL DEFICITS  Psychiatric: She has a normal mood and affect.  Vitals reviewed.         Assessment & Plan:

## 2015-03-20 NOTE — Patient Instructions (Signed)
FOLLOW A SOFT MECHANICAL DIET.  MEATS SHOULD BE CHOPPED OR GROUND ONLY. DO NOT EAT bread or CHUNKS OF ANYTHING.  AVOID TRIGGERS FOR REFLUX. SEE INFO BELOW.  Complete endoscopy  FEB 1.  FOLLOW UP IN JUN 2017.  Lifestyle and home remedies TO CONTROL CHEST PAIN.  You may eliminate or reduce the frequency of heartburn by making the following lifestyle changes:  . Control your weight. Being overweight is a major risk factor for heartburn and GERD. Excess pounds put pressure on your abdomen, pushing up your stomach and causing acid to back up into your esophagus.   . Eat smaller meals. 4 TO 6 MEALS A DAY. This reduces pressure on the lower esophageal sphincter, helping to prevent the valve from opening and acid from washing back into your esophagus.   Dolphus Jenny your belt. Clothes that fit tightly around your waist put pressure on your abdomen and the lower esophageal sphincter.   . Eliminate heartburn triggers. Everyone has specific triggers. Common triggers such as fatty or fried foods, spicy food, tomato sauce, carbonated beverages, alcohol, chocolate, mint, garlic, onion, caffeine and nicotine may make heartburn worse.   Marland Kitchen Avoid stooping or bending. Tying your shoes is OK. Bending over for longer periods to weed your garden isn't, especially soon after eating.   . Don't lie down after a meal. Wait at least three to four hours after eating before going to bed, and don't lie down right after eating.   Alternative medicine . Several home remedies exist for treating GERD, but they provide only temporary relief. They include drinking baking soda (sodium bicarbonate) added to water or drinking other fluids such as baking soda mixed with cream of tartar and water. . Although these liquids create temporary relief by neutralizing, washing away or buffering acids, eventually they aggravate the situation by adding gas and fluid to your stomach, increasing pressure and causing more acid reflux. Further,  adding more sodium to your diet may increase your blood pressure and add stress to your heart, and excessive bicarbonate ingestion can alter the acid-base balance in your body.  SOFT MECHANICAL DIET This SOFT MECHANICAL DIET is restricted to:  Foods that are moist, soft-textured, and easy to chew and swallow.   Meats that are ground or are minced no larger than one-quarter inch pieces. Meats are moist with gravy or sauce added.   Foods that do not include bread or bread-like textures except soft pancakes, well-moistened with syrup or sauce.   Textures with some chewing ability required.   Casseroles without rice.   Cooked vegetables that are less than half an inch in size and easily mashed with a fork. No cooked corn, peas, broccoli, cauliflower, cabbage, Brussels sprouts, asparagus, or other fibrous, non-tender or rubbery cooked vegetables.   Canned fruit except for pineapple. Fruit must be cut into pieces no larger than half an inch in size.   Foods that do not include nuts, seeds, coconut, or sticky textures.   FOOD TEXTURES FOR SOFT MECHANICAL DIET  FOOD GROUP: Breads. RECOMMENDED: Soft pancakes, well-moistened with syrup or sauce.  AVOID: All others.  FOOD GROUP: Cereals.  RECOMMENDED: Cooked cereals with little texture, including oatmeal. Unprocessed wheat bran stirred into cereals for bulk. Note: If thin liquids are restricted, it is important that all of the liquid is absorbed into the cereal.  AVOID: All dry cereals and any cooked cereals that may contain flax seeds or other seeds or nuts. Whole-grain, dry, or coarse cereals. Cereals with nuts, seeds,  dried fruit, and/or coconut.  FOOD GROUP: Desserts. RECOMMENDED: Pudding, custard. Soft fruit pies with bottom crust only. Canned fruit (excluding pineapple). Soft, moist cakes with icing.Frozen malts, milk shakes, frozen yogurt, eggnog, nutritional supplements, ice cream, sherbet, regular or sugar-free gelatin, or any foods  that become thin liquid at either room (70 F) or body temperature (98 F).  AVOID: Dry, coarse cakes and cookies. Anything with nuts, seeds, coconut, pineapple, or dried fruit. Breakfast yogurt with nuts. Rice or bread pudding.  FOOD GROUP: Fats. RECOMMENDED: Butter, margarine, cream for cereal (depending on liquid consistency recommendations), gravy, cream sauces, sour cream, sour cream dips with soft additives, mayonnaise, salad dressings, cream cheese, cream cheese spreads with soft additives, whipped toppings.  AVOID: All fats with coarse or chunky additives.  FOOD GROUP: Fruits. RECOMMENDED: Soft drained, canned, or cooked fruits without seeds or skin. Fresh soft and ripe banana. Fruit juices with a small amount of pulp. If thin liquids are restricted, fruit juices should be thickened to appropriate consistency.  AVOID: Fresh or frozen fruits. Cooked fruit with skin or seeds. Dried fruits. Fresh, canned, or cooked pineapple.  FOOD GROUP: Meats and Meat Substitutes. (Meat pieces should not exceed 1/4 of an inch cube and should be tender.) RECOMMENDED: Moistened ground or cooked meat, poultry, or fish. Moist ground or tender meat may be served with gravy or sauce. Casseroles without rice. Moist macaroni and cheese, well-cooked pasta with meat sauce, tuna noodle casserole, soft, moist lasagna. Moist meatballs, meatloaf, or fish loaf. Protein salads, such as tuna or egg without large chunks, celery, or onion. Cottage cheese, smooth quiche without large chunks. Poached, scrambled, or soft-cooked eggs (egg yolks should not be "runny" but should be moist and able to be mashed with butter, margarine, or other moisture added to them). (Cook eggs to 160 F or use pasteurized eggs for safety.) Souffls may have small, soft chunks. Tofu. Well-cooked, slightly mashed, moist legumes, such as baked beans. All meats or protein substitutes should be served with sauces or moistened to help maintain  cohesiveness in the oral cavity.  AVOID: Dry meats, tough meats (such as bacon, sausage, hot dogs, bratwurst). Dry casseroles or casseroles with rice or large chunks. Peanut butter. Cheese slices and cubes. Hard-cooked or crisp fried eggs. Sandwiches.Pizza.  FOOD GROUP: Potatoes and Starches. RECOMMENDED: Well-cooked, moistened, boiled, baked, or mashed potatoes. Well-cooked shredded hash brown potatoes that are not crisp. (All potatoes need to be moist and in sauces.)Well-cooked noodles in sauce. Spaetzel or soft dumplings that have been moistened with butter or gravy.  AVOID: Potato skins and chips. Fried or French-fried potatoes. Rice.  FOOD GROUP: Soups. RECOMMENDED: Soups with easy-to-chew or easy-to-swallow meats or vegetables: Particle sizes in soups should be less than 1/2 inch. Soups will need to be thickened to appropriate consistency if soup is thinner than prescribed liquid consistency.  AVOID: Soups with large chunks of meat and vegetables. Soups with rice, corn, peas.  FOOD GROUP: Vegetables. RECOMMENDED: All soft, well-cooked vegetables. Vegetables should be less than a half inch. Should be easily mashed with a fork.  AVOID: Cooked corn and peas. Broccoli, cabbage, Brussels sprouts, asparagus, or other fibrous, non-tender or rubbery cooked vegetables.  FOOD GROUP: Miscellaneous. RECOMMENDED: Jams and preserves without seeds, jelly. Sauces, salsas, etc., that may have small tender chunks less than 1/2 inch. Soft, smooth chocolate bars that are easily chewed.  AVOID: Seeds, nuts, coconut, or sticky foods. Chewy candies such as caramels or licorice.

## 2015-03-20 NOTE — Assessment & Plan Note (Addendum)
LAST LABS AT [facility]-2014.   OBTAIN LABS FROM PCP. ENCOURAGED 10 LBS WEIGHT LOSS.

## 2015-03-20 NOTE — Assessment & Plan Note (Signed)
MULTIPLE SIMPLE ADENOMAS REMOVED 2013. NO WARNING SIGNS/SYMPTOMS  TCS FEB 1SUPREP.HOLD GLIPIZIDE. CONTINUE GLUCOPHAGE.  PHENERGAN 12.5 MG IV IN PREOP.

## 2015-03-22 NOTE — Progress Notes (Signed)
ON RECALL  °

## 2015-03-29 ENCOUNTER — Encounter: Payer: Self-pay | Admitting: *Deleted

## 2015-03-29 ENCOUNTER — Ambulatory Visit (INDEPENDENT_AMBULATORY_CARE_PROVIDER_SITE_OTHER): Payer: Medicare Other | Admitting: Cardiovascular Disease

## 2015-03-29 ENCOUNTER — Encounter: Payer: Self-pay | Admitting: Cardiovascular Disease

## 2015-03-29 ENCOUNTER — Telehealth: Payer: Self-pay | Admitting: Cardiovascular Disease

## 2015-03-29 VITALS — BP 145/80 | HR 76 | Ht 65.0 in | Wt 166.0 lb

## 2015-03-29 DIAGNOSIS — E785 Hyperlipidemia, unspecified: Secondary | ICD-10-CM | POA: Diagnosis not present

## 2015-03-29 DIAGNOSIS — I10 Essential (primary) hypertension: Secondary | ICD-10-CM | POA: Diagnosis not present

## 2015-03-29 DIAGNOSIS — I517 Cardiomegaly: Secondary | ICD-10-CM | POA: Diagnosis not present

## 2015-03-29 DIAGNOSIS — R079 Chest pain, unspecified: Secondary | ICD-10-CM

## 2015-03-29 DIAGNOSIS — I25118 Atherosclerotic heart disease of native coronary artery with other forms of angina pectoris: Secondary | ICD-10-CM

## 2015-03-29 DIAGNOSIS — Z01818 Encounter for other preprocedural examination: Secondary | ICD-10-CM

## 2015-03-29 MED ORDER — NITROGLYCERIN 0.4 MG SL SUBL: 0.4000 mg | SUBLINGUAL_TABLET | SUBLINGUAL | Status: DC | PRN

## 2015-03-29 NOTE — Telephone Encounter (Signed)
Exercise cardiolite Feb 8th arrive at 8;45 at [facility]

## 2015-03-29 NOTE — Patient Instructions (Addendum)
Your physician recommends that you schedule a follow-up appointment in: [place]  Your physician has recommended you make the following change in your medication:   TAKE NITROGLYCERIN AS NEEDED   Your physician has requested that you have en exercise stress myoview. For further information please visit [email or web address]. Please follow instruction sheet, as given.  PLEASE HOLD METOPROLOL AND DIABETIC MEDICATION THE DAY OF YOUR TEST   Thank you for choosing [facility]!!

## 2015-03-29 NOTE — Progress Notes (Signed)
Patient ID: Carmen [name], female   DOB: 06-04-1940, 75 y.o.   MRN: [id number]      SUBJECTIVE: The patient presents for routine follow-up of coronary artery disease with LAD stents. Echocardiogram on 11/20/13 demonstrated normal left ventricular systolic function, EF 97-28%, grade 1 diastolic dysfunction, and severe left atrial enlargement. Has preferred to avoid statin therapy. Has diabetes.  She is here with her daughter who is an Therapist, sports.  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 She describes it as a squeezing sensation. She did not have any SL nitroglycerin. She had been doing so well over the last several years and was thus surprised by this. Was associated with shortness of breath and diaphoresis. She had 2 similar episodes afterwards which were not as intense. No associated GERD.  She stays quite active and has not had any limitations in daily activities.   ECG on 11/30/14 demonstrated normal sinus rhythm with no ischemic ST segment or T-wave abnormalities.   Echocardiogram 11/20/13 demonstrated normal left ventricular systolic function, LVEF 20-60%, moderate LVH, grade 1 diastolic dysfunction, and severe left atrial enlargement.  She checks her blood pressure at home and it has remained well controlled. A nurse checked it 2 days ago and it was 124/60.  Being scheduled for endoscopy.  Lipids on 11/17/14 showed total cholesterol 183, triglycerides 218, HDL 31, LDL 108.  Review of Systems: As per "subjective", otherwise negative.  Allergies  Allergen Reactions  . Sulfa Antibiotics Nausea And Vomiting    Current Outpatient Prescriptions  Medication Sig Dispense Refill  . amLODipine (NORVASC) 10 MG tablet Take 10 mg by mouth daily.    Marland Kitchen aspirin 81 MG chewable tablet Chew 81 mg by mouth daily.    . busPIRone (BUSPAR) 15 MG tablet Take 15 mg by mouth daily. Take 1/2  tablet daily    . fish oil-omega-3 fatty acids 1000 MG capsule Take 1 g by mouth 2 (two) times daily.     Marland Kitchen glipiZIDE (GLUCOTROL) 5 MG tablet Take 5 mg by mouth 2 (two) times daily before a meal.    . lisinopril (PRINIVIL,ZESTRIL) 10 MG tablet Take 10 mg by mouth daily.    Marland Kitchen LORazepam (ATIVAN) 0.5 MG tablet Take 0.5 mg by mouth 2 (two) times daily.    . metFORMIN (GLUCOPHAGE) 500 MG tablet Take 500 mg by mouth 2 (two) times daily with a meal.    . metoprolol succinate (TOPROL-XL) 50 MG 24 hr tablet Take 50 mg by mouth daily. Take with or immediately following a meal.    . mirtazapine (REMERON) 45 MG tablet Take 45 mg by mouth at bedtime.    . nitrofurantoin (MACRODANTIN) 50 MG capsule Take 50 mg by mouth at bedtime.     . pantoprazole (PROTONIX) 40 MG tablet TAKE 1 TABLET BY MOUTH TWICE DAILY. TAKE 30 MINS PRIOR TO MEALS 60 tablet 5   No current facility-administered medications for this visit.    Past Medical History  Diagnosis Date  . Diabetes ([place])     diet controlled  . GERD (gastroesophageal reflux disease)     Esophagus dilated in the 1990s per patient  . Abnormal LFTs   . Hypertension   . Hyperlipidemia   . CAD (coronary artery disease)     Past Surgical History  Procedure Laterality Date  . Abdominal hysterectomy    . Esophageal dilation  In the 1990s  . Appendectomy    . Colonoscopy with esophagogastroduodenoscopy (egd)  02/15/2012    MCR:FVOHK sessile polyps 3-8 mm found at the cecum/ 8 polyps measuring 3-8 mm in/1 polyp measuring 6 mm in transverse colon/3-79m polyps found in the sigmoid/6 mm polyp found in the rectum/small internal hemorrhoids  . 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 Cardiac catheterization  2009    with stent  . Cataract extraction      left    Social History   Social History  .  Marital Status: Single    Spouse Name: N/A  . Number of Children: 3  . Years of Education: N/A   Occupational History  . Not on file.   Social History Main Topics  . Smoking status: Never Smoker   . Smokeless tobacco: Never Used  . Alcohol Use: No  . Drug Use: No  . Sexual Activity: Yes    Birth Control/ Protection: Surgical   Other Topics Concern  . Not on file   Social History Narrative     Filed Vitals:   03/29/15 1348 03/29/15 1355  BP: 146/76 145/80  Pulse: 77 76  Height: 5' 5"  (1.651 m)   Weight: 166 lb (75.297 kg)     PHYSICAL EXAM General: NAD HEENT: Normal. Neck: No JVD, no thyromegaly. Lungs: Clear to auscultation bilaterally with normal respiratory effort. CV: Nondisplaced PMI.  Regular rate and rhythm, normal S1/S2, no S3/S4, no murmur. No pretibial or periankle edema.  No carotid bruit.  Abdomen: Soft, nontender, no hepatosplenomegaly, no distention.  Neurologic: Alert and oriented x 3.  Psych: Normal affect. Skin: Normal. Musculoskeletal: Normal range of motion, no gross deformities. Extremities: No clubbing or cyanosis.   ECG: Most recent ECG reviewed.      ASSESSMENT AND PLAN: 1. CAD with LAD stents and chest pain. Unclear if symptoms were cardiac in etiology. Will obtain an exercise Cardiolite stress test. She does not need this completed prior to endoscopy as it is a low risk procedure. BP normally well controlled, elevated today.  Currently on ASA and metoprolol, and not on a statin, as per her preference and presumably due to h/o elevated LFT's. Takes fish oil.  Will prescribe SL nitroglycerin.  2. Essential HTN: Mildly elevated but normally well controlled at home and when checked by an RN recently. No changes to therapy.  3. Hyperlipidemia: On fish oil. Most recent results noted above. She prefers to remain off statin therapy.  4. Severe left atrial enlargement with palpitations: At risk for development of atrial fibrillation given  history of CAD and hypertension, along with age and gender. Will monitor.  Dispo: f/u 2 months.   SKate Sable M.D., F.A.C.C.

## 2015-04-03 ENCOUNTER — Encounter (HOSPITAL_COMMUNITY): Payer: Self-pay | Admitting: *Deleted

## 2015-04-03 ENCOUNTER — Ambulatory Visit (HOSPITAL_COMMUNITY)
Admission: RE | Admit: 2015-04-03 | Discharge: 2015-04-03 | Disposition: A | Discharge status: Home / Self Care | Payer: Medicare Other | Source: Ambulatory Visit | Attending: Gastroenterology | Admitting: Gastroenterology

## 2015-04-03 ENCOUNTER — Encounter (HOSPITAL_COMMUNITY)
Admission: RE | Disposition: A | Discharge status: Home / Self Care | Payer: Self-pay | Source: Ambulatory Visit | Attending: Gastroenterology

## 2015-04-03 DIAGNOSIS — Z79899 Other long term (current) drug therapy: Secondary | ICD-10-CM | POA: Insufficient documentation

## 2015-04-03 DIAGNOSIS — Z1211 Encounter for screening for malignant neoplasm of colon: Secondary | ICD-10-CM | POA: Insufficient documentation

## 2015-04-03 DIAGNOSIS — K319 Disease of stomach and duodenum, unspecified: Secondary | ICD-10-CM | POA: Diagnosis not present

## 2015-04-03 DIAGNOSIS — Z7982 Long term (current) use of aspirin: Secondary | ICD-10-CM | POA: Diagnosis not present

## 2015-04-03 DIAGNOSIS — K222 Esophageal obstruction: Secondary | ICD-10-CM | POA: Insufficient documentation

## 2015-04-03 DIAGNOSIS — R1310 Dysphagia, unspecified: Secondary | ICD-10-CM | POA: Diagnosis not present

## 2015-04-03 DIAGNOSIS — D122 Benign neoplasm of ascending colon: Secondary | ICD-10-CM | POA: Diagnosis not present

## 2015-04-03 DIAGNOSIS — R110 Nausea: Secondary | ICD-10-CM | POA: Insufficient documentation

## 2015-04-03 DIAGNOSIS — D123 Benign neoplasm of transverse colon: Secondary | ICD-10-CM

## 2015-04-03 DIAGNOSIS — I2510 Atherosclerotic heart disease of native coronary artery without angina pectoris: Secondary | ICD-10-CM | POA: Diagnosis not present

## 2015-04-03 DIAGNOSIS — K2970 Gastritis, unspecified, without bleeding: Secondary | ICD-10-CM | POA: Diagnosis not present

## 2015-04-03 DIAGNOSIS — Z86010 Personal history of colonic polyps: Secondary | ICD-10-CM | POA: Diagnosis not present

## 2015-04-03 DIAGNOSIS — K219 Gastro-esophageal reflux disease without esophagitis: Secondary | ICD-10-CM | POA: Diagnosis not present

## 2015-04-03 DIAGNOSIS — I10 Essential (primary) hypertension: Secondary | ICD-10-CM | POA: Insufficient documentation

## 2015-04-03 DIAGNOSIS — K317 Polyp of stomach and duodenum: Secondary | ICD-10-CM | POA: Diagnosis not present

## 2015-04-03 DIAGNOSIS — K648 Other hemorrhoids: Secondary | ICD-10-CM | POA: Insufficient documentation

## 2015-04-03 DIAGNOSIS — Q2733 Arteriovenous malformation of digestive system vessel: Secondary | ICD-10-CM | POA: Diagnosis not present

## 2015-04-03 DIAGNOSIS — Z7984 Long term (current) use of oral hypoglycemic drugs: Secondary | ICD-10-CM | POA: Diagnosis not present

## 2015-04-03 DIAGNOSIS — E785 Hyperlipidemia, unspecified: Secondary | ICD-10-CM | POA: Diagnosis not present

## 2015-04-03 DIAGNOSIS — K5730 Diverticulosis of large intestine without perforation or abscess without bleeding: Secondary | ICD-10-CM | POA: Diagnosis not present

## 2015-04-03 DIAGNOSIS — E119 Type 2 diabetes mellitus without complications: Secondary | ICD-10-CM | POA: Insufficient documentation

## 2015-04-03 HISTORY — PX: SAVORY DILATION: SHX5439

## 2015-04-03 HISTORY — PX: ESOPHAGOGASTRODUODENOSCOPY: SHX5428

## 2015-04-03 HISTORY — PX: COLONOSCOPY: SHX5424

## 2015-04-03 LAB — GLUCOSE, CAPILLARY: GLUCOSE-CAPILLARY: 115 mg/dL — AB (ref 65–99)

## 2015-04-03 SURGERY — COLONOSCOPY
Anesthesia: Moderate Sedation

## 2015-04-03 MED ORDER — STERILE WATER FOR IRRIGATION IR SOLN
Status: DC | PRN
  Administered 2015-04-03: 13:00:00

## 2015-04-03 MED ORDER — MINERAL OIL PO OIL
TOPICAL_OIL | ORAL | Status: AC
  Filled 2015-04-03: qty 30

## 2015-04-03 MED ORDER — MIDAZOLAM HCL 5 MG/5ML IJ SOLN
INTRAMUSCULAR | Status: AC
  Filled 2015-04-03: qty 10

## 2015-04-03 MED ORDER — MEPERIDINE HCL 100 MG/ML IJ SOLN
INTRAMUSCULAR | Status: DC | PRN
  Administered 2015-04-03: 25 mg via INTRAVENOUS
  Administered 2015-04-03: 50 mg via INTRAVENOUS
  Administered 2015-04-03: 25 mg via INTRAVENOUS

## 2015-04-03 MED ORDER — PROMETHAZINE HCL 25 MG/ML IJ SOLN
12.5000 mg | Freq: Once | INTRAMUSCULAR | Status: AC
  Administered 2015-04-03: 12.5 mg via INTRAVENOUS

## 2015-04-03 MED ORDER — MEPERIDINE HCL 100 MG/ML IJ SOLN
INTRAMUSCULAR | Status: AC
  Filled 2015-04-03: qty 2

## 2015-04-03 MED ORDER — MIDAZOLAM HCL 5 MG/5ML IJ SOLN
INTRAMUSCULAR | Status: DC | PRN
Start: 2015-04-03 — End: 2015-04-03
  Administered 2015-04-03 (×2): 2 mg via INTRAVENOUS
  Administered 2015-04-03 (×2): 1 mg via INTRAVENOUS
  Administered 2015-04-03 (×2): 2 mg via INTRAVENOUS

## 2015-04-03 MED ORDER — SODIUM CHLORIDE 0.9 % IV SOLN
INTRAVENOUS | Status: DC
  Administered 2015-04-03: 1000 mL via INTRAVENOUS

## 2015-04-03 MED ORDER — LIDOCAINE VISCOUS 2 % MT SOLN
OROMUCOSAL | Status: AC
  Filled 2015-04-03: qty 15

## 2015-04-03 MED ORDER — PROMETHAZINE HCL 25 MG/ML IJ SOLN
INTRAMUSCULAR | Status: AC
  Filled 2015-04-03: qty 1

## 2015-04-03 MED ORDER — SODIUM CHLORIDE 0.9% FLUSH
INTRAVENOUS | Status: AC
  Filled 2015-04-03: qty 10

## 2015-04-03 MED ORDER — LIDOCAINE VISCOUS 2 % MT SOLN
OROMUCOSAL | Status: DC | PRN
  Administered 2015-04-03: 1 via OROMUCOSAL

## 2015-04-03 NOTE — Interval H&P Note (Signed)
History and Physical Interval Note:  04/03/2015 12:35 PM  Carmen [name]  has presented today for surgery, with the diagnosis of DYSPHAGIA/COLON POLYPS  The various methods of treatment have been discussed with the patient and family. After consideration of risks, benefits and other options for treatment, the patient has consented to  Procedure(s) with comments: COLONOSCOPY (N/A) - 1245 ESOPHAGOGASTRODUODENOSCOPY (EGD) (N/A) SAVORY DILATION (N/A) MALONEY DILATION (N/A) as a surgical intervention .  The patient's history has been reviewed, patient examined, no change in status, stable for surgery.  I have reviewed the patient's chart and labs.  Questions were answered to the patient's satisfaction.     [employer]

## 2015-04-03 NOTE — Discharge Instructions (Signed)
You had 4 small polyps removed. You have internal hemorrhoids and diverticulosis IN YOUR LEFT COLON. I dilated your esophagus DUE TO A STRICTURE. You have EROSIVE gastritis. I biopsied your stomach.   FOLLOW A HIGH FIBER/LOW FAT DIET. AVOID ITEMS THAT CAUSE BLOATING. SEE INFO BELOW.  CONTINUE PROTONIX. TAKE 30 MINUTES PRIOR TO MEALS TWICE DAILY.  YOUR BIOPSY RESULTS WILL BE AVAILABLE IN MY CHART FEB 6 AND MY OFFICE WILL CONTACT YOU IN 10-14 DAYS WITH YOUR RESULTS.   FOLLOW UP IN 3 MOS. Jul 01, 2015 11:00AM with Magda Paganini  Next colonoscopy in 5-10 years.  ENDOSCOPY Care After Read the instructions outlined below and refer to this sheet in the next week. These discharge instructions provide you with general information on caring for yourself after you leave the hospital. While your treatment has been planned according to the most current medical practices available, unavoidable complications occasionally occur. If you have any problems or questions after discharge, call DR. FIELDS, [phone].  ACTIVITY  You may resume your regular activity, but move at a slower pace for the next 24 hours.   Take frequent rest periods for the next 24 hours.   Walking will help get rid of the air and reduce the bloated feeling in your belly (abdomen).   No driving for 24 hours (because of the medicine (anesthesia) used during the test).   You may shower.   Do not sign any important legal documents or operate any machinery for 24 hours (because of the anesthesia used during the test).    NUTRITION  Drink plenty of fluids.   You may resume your normal diet as instructed by your doctor.   Begin with a light meal and progress to your normal diet. Heavy or fried foods are harder to digest and may make you feel sick to your stomach (nauseated).   Avoid alcoholic beverages for 24 hours or as instructed.    MEDICATIONS  You may resume your normal medications.   WHAT YOU CAN EXPECT TODAY  Some  feelings of bloating in the abdomen.   Passage of more gas than usual.   Spotting of blood in your stool or on the toilet paper  .  IF YOU HAD POLYPS REMOVED DURING THE ENDOSCOPY:  Eat a soft diet IF YOU HAVE NAUSEA, BLOATING, ABDOMINAL PAIN, OR VOMITING.    FINDING OUT THE RESULTS OF YOUR TEST Not all test results are available during your visit. DR. Oneida Alar WILL CALL YOU WITHIN 14 DAYS OF YOUR PROCEDUE WITH YOUR RESULTS. Do not assume everything is normal if you have not heard from DR. FIELDS, CALL HER OFFICE AT [phone].  SEEK IMMEDIATE MEDICAL ATTENTION AND CALL THE OFFICE: [phone] IF:  You have more than a spotting of blood in your stool.   Your belly is swollen (abdominal distention).   You are nauseated or vomiting.   You have a temperature over 101F.   You have abdominal pain or discomfort that is severe or gets worse throughout the day.  Gastritis  Gastritis is an inflammation (the body's way of reacting to injury and/or infection) of the stomach. It is often caused by viral or bacterial (germ) infections. It can also be caused BY ASPIRIN, BC/GOODY POWDER'S, (IBUPROFEN) MOTRIN, OR ALEVE (NAPROXEN), chemicals (including alcohol), SPICY FOODS, and medications. This illness may be associated with generalized malaise (feeling tired, not well), UPPER ABDOMINAL STOMACH cramps, and fever. One common bacterial cause of gastritis is an organism known as H. Pylori. This can be treated  with antibiotics.   High-Fiber Diet A high-fiber diet changes your normal diet to include more whole grains, legumes, fruits, and vegetables. Changes in the diet involve replacing refined carbohydrates with unrefined foods. The calorie level of the diet is essentially unchanged. The Dietary Reference Intake (recommended amount) for adult males is 38 grams per day. For adult females, it is 25 grams per day. Pregnant and lactating women should consume 28 grams of fiber per day. Fiber is the intact  part of a plant that is not broken down during digestion. Functional fiber is fiber that has been isolated from the plant to provide a beneficial effect in the body. PURPOSE  Increase stool bulk.   Ease and regulate bowel movements.   Lower cholesterol.  REDUCE RISK OF COLON CANCER  INDICATIONS THAT YOU NEED MORE FIBER  Constipation and hemorrhoids.   Uncomplicated diverticulosis (intestine condition) and irritable bowel syndrome.   Weight management.   As a protective measure against hardening of the arteries (atherosclerosis), diabetes, and cancer.   GUIDELINES FOR INCREASING FIBER IN THE DIET  Start adding fiber to the diet slowly. A gradual increase of about 5 more grams (2 slices of whole-wheat bread, 2 servings of most fruits or vegetables, or 1 bowl of high-fiber cereal) per day is best. Too rapid an increase in fiber may result in constipation, flatulence, and bloating.   Drink enough water and fluids to keep your urine clear or pale yellow. Water, juice, or caffeine-free drinks are recommended. Not drinking enough fluid may cause constipation.   Eat a variety of high-fiber foods rather than one type of fiber.   Try to increase your intake of fiber through using high-fiber foods rather than fiber pills or supplements that contain small amounts of fiber.   The goal is to change the types of food eaten. Do not supplement your present diet with high-fiber foods, but replace foods in your present diet.   INCLUDE A VARIETY OF FIBER SOURCES  Replace refined and processed grains with whole grains, canned fruits with fresh fruits, and incorporate other fiber sources. White rice, white breads, and most bakery goods contain little or no fiber.   Brown whole-grain rice, buckwheat oats, and many fruits and vegetables are all good sources of fiber. These include: broccoli, Brussels sprouts, cabbage, cauliflower, beets, sweet potatoes, white potatoes (skin on), carrots, tomatoes,  eggplant, squash, berries, fresh fruits, and dried fruits.   Cereals appear to be the richest source of fiber. Cereal fiber is found in whole grains and bran. Bran is the fiber-rich outer coat of cereal grain, which is largely removed in refining. In whole-grain cereals, the bran remains. In breakfast cereals, the largest amount of fiber is found in those with "bran" in their names. The fiber content is sometimes indicated on the label.   You may need to include additional fruits and vegetables each day.   In baking, for 1 cup white flour, you may use the following substitutions:   1 cup whole-wheat flour minus 2 tablespoons.   1/2 cup white flour plus 1/2 cup whole-wheat flour.    Polyps, Colon  A polyp is extra tissue that grows inside your body. Colon polyps grow in the large intestine. The large intestine, also called the colon, is part of your digestive system. It is a long, hollow tube at the end of your digestive tract where your body makes and stores stool. Most polyps are not dangerous. They are benign. This means they are not cancerous. But over  time, some types of polyps can turn into cancer. Polyps that are smaller than a pea are usually not harmful. But larger polyps could someday become or may already be cancerous. To be safe, doctors remove all polyps and test them.    PREVENTION There is not one sure way to prevent polyps. You might be able to lower your risk of getting them if you:  Eat more fruits and vegetables and less fatty food.   Do not smoke.   Avoid alcohol.   Exercise every day.   Lose weight if you are overweight.   Eating more calcium and folate can also lower your risk of getting polyps. Some foods that are rich in calcium are milk, cheese, and broccoli. Some foods that are rich in folate are chickpeas, kidney beans, and spinach.   Diverticulosis Diverticulosis is a common condition that develops when small pouches (diverticula) form in the wall of the  colon. The risk of diverticulosis increases with age. It happens more often in people who eat a low-fiber diet. Most individuals with diverticulosis have no symptoms. Those individuals with symptoms usually experience belly (abdominal) pain, constipation, or loose stools (diarrhea).  HOME CARE INSTRUCTIONS  Increase the amount of fiber in your diet as directed by your caregiver or dietician. This may reduce symptoms of diverticulosis.   Drink at least 6 to 8 glasses of water each day to prevent constipation.   Try not to strain when you have a bowel movement.   Avoiding nuts and seeds to prevent complications is NOT NECESSARY.    SEEK IMMEDIATE MEDICAL CARE IF:  You develop increasing pain or severe bloating.   You have an oral temperature above 101F.   You develop vomiting or bowel movements that are bloody or black.  PATIENT INSTRUCTIONS POST-ANESTHESIA  IMMEDIATELY FOLLOWING SURGERY:  Do not drive or operate machinery for the first twenty four hours after surgery.  Do not make any important decisions for twenty four hours after surgery or while taking narcotic pain medications or sedatives.  If you develop intractable nausea and vomiting or a severe headache please notify your doctor immediately.  FOLLOW-UP:  Please make an appointment with your surgeon as instructed. You do not need to follow up with anesthesia unless specifically instructed to do so.  WOUND CARE INSTRUCTIONS (if applicable):  Keep a dry clean dressing on the anesthesia/puncture wound site if there is drainage.  Once the wound has quit draining you may leave it open to air.  Generally you should leave the bandage intact for twenty four hours unless there is drainage.  If the epidural site drains for more than 36-48 hours please call the anesthesia department.  QUESTIONS?:  Please feel free to call your physician or the hospital operator if you have any questions, and they will be happy to assist you.

## 2015-04-03 NOTE — H&P (View-Only) (Signed)
Subjective:    Patient ID: Carmen [name], female    DOB: 02-13-1941, 75 y.o.   MRN: [id number]  Shade Flood, MD  HPI BEEN HAVING GRABBING CHEST PAIN OFF AND ON(1: SEVERE, 2: MILD). STARTED WITHIN TEH LAST MO. MAY ALSO HAVE REGURGITATION AS WELL. LAST EGD/DIL 2013.ASSOCIATED WITH SOB FOR A FEW MINS AND HAD TO SIT DOWN FOR A WHILE.  ONE DAY SAW BLACK STOOL LIKE SMUT. THINKS IT WAS SOMETHING SHE ATE. IN FEW DAYS IT WENT AWAY. AFTER THAT WENT AWAY WAS TAKING PEPTO BISMOL. DAUGHTER IS AN RN. APPETITE: GREAT. WEIGHT STABLE FOR PAST WUJW(119-147). FELT SCOPE IN THROAT FOR A SPLIT SECOND. HEARTBURN: NONE BMs: REGULAR, QD OR QOD DUE TO 1 CUP APPLESAUCE A DAY. NO NAUSEA/VOMITIING SINCE 3 YRS AGO.  PT DENIES FEVER, CHILLS, HEMATOCHEZIA, nausea, vomiting, melena, diarrhea, CHANGE IN BOWEL IN HABITS, constipation, abdominal pain, OR heartburn or indigestion.  Past Medical History  Diagnosis Date  . Diabetes     diet controlled  . GERD (gastroesophageal reflux disease)     Esophagus dilated in the 1990s per patient  . Abnormal LFTs   . Hypertension   . Hyperlipidemia   . CAD (coronary artery disease)     Past Surgical History  Procedure Laterality Date  . Abdominal hysterectomy    . Esophageal dilation      In the 1990s  . Appendectomy    . Colonoscopy with esophagogastroduodenoscopy (egd)  02/15/2012    WGN:FAOZH sessile polyps 3-8 mm found at the cecum/ 8 polyps measuring 3-8 mm in/1 polyp measuring 6 mm in transverse colon/3-29m polyps found in the sigmoid/6 mm polyp found in the rectum/small internal hemorrhoids  . 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 Cardiac catheterization  2009    with stent  . Cataract extraction      left   Allergies  Allergen Reactions  . Sulfa Antibiotics Nausea And Vomiting    Current  Outpatient Prescriptions  Medication Sig Dispense Refill  . amLODipine (NORVASC) 10 MG tablet Take 10 mg by mouth daily.    .Marland Kitchenaspirin 81 MG chewable tablet Chew 81 mg by mouth daily.    . busPIRone (BUSPAR) 15 MG tablet Take 15 mg by mouth daily. Take 1/2 tablet daily    . fish oil-omega-3 fatty acids 1000 MG capsule Take 1 g by mouth 2 (two) times daily.     .Marland KitchenglipiZIDE (GLUCOTROL) 5 MG tablet Take 5 mg by mouth 2 (two) times daily before a meal.    . lisinopril (PRINIVIL,ZESTRIL) 2.5 MG tablet Take 2.5 mg by mouth daily.    .Marland KitchenLORazepam (ATIVAN) 0.5 MG tablet Take 0.5 mg by mouth 2 (two) times daily.    . metFORMIN (GLUCOPHAGE) 500 MG tablet Take 500 mg by mouth 2 (two) times daily with a meal.    . metoprolol succinate (TOPROL-XL) 100 MG 24 hr tablet Take 50 mg by mouth daily. Take with or immediately following a meal.    . mirtazapine (REMERON) 30 MG tablet Take 30 mg by mouth at bedtime.    . nitrofurantoin (MACRODANTIN) 50 MG capsule Take 50 mg by mouth at bedtime.     . pantoprazole (PROTONIX) 40 MG tablet TAKE 1 TABLET BY MOUTH TWICE DAILY. TAKE 30 MINS PRIOR TO MEALS    . ibuprofen (ADVIL,MOTRIN) 200 MG tablet Take 600 mg by mouth  every 6 (six) hours as needed. Reported on 03/20/2015     Review of Systems PER HPI OTHERWISE ALL SYSTEMS ARE NEGATIVE.    Objective:   Physical Exam  Constitutional: She is oriented to person, place, and time. She appears well-developed and well-nourished. No distress.  HENT:  Head: Normocephalic and atraumatic.  Mouth/Throat: Oropharynx is clear and moist. No oropharyngeal exudate.  Eyes: Pupils are equal, round, and reactive to light. No scleral icterus.  Neck: Normal range of motion. Neck supple.  Cardiovascular: Normal rate, regular rhythm and normal heart sounds.   Pulmonary/Chest: Effort normal and breath sounds normal. No respiratory distress.  Abdominal: Soft. Bowel sounds are normal. She exhibits no distension. There is no tenderness.    Musculoskeletal: She exhibits no edema.  Lymphadenopathy:    She has no cervical adenopathy.  Neurological: She is alert and oriented to person, place, and time.  NO  NEW FOCAL DEFICITS  Psychiatric: She has a normal mood and affect.  Vitals reviewed.         Assessment & Plan:

## 2015-04-03 NOTE — Op Note (Signed)
[facility] [address] [place], [address]   ENDOSCOPY PROCEDURE REPORT  PATIENT: Carmen [name], Carmen [name]  MR#: [id number] BIRTHDATE: 10-03-1940 , 63  yrs. old GENDER: female  ENDOSCOPIST: Danie Binder, MD REFFERED BY:  PROCEDURE DATE:  2015/04/21 PROCEDURE:   EGD with biopsy and EGD with dilatation over guidewire   INDICATIONS:1.  nausea. MEDICATIONS: TCS +Versed 4 mg IV  MD INITIATED SEDATION 1248. PROCEDURE COMPLETE: 1353 TOPICAL ANESTHETIC: Viscous Xylocaine  DESCRIPTION OF PROCEDURE:   After the risks benefits and alternatives of the procedure were thoroughly explained, informed consent was obtained.  The EC-[id number] ([id number])  endoscope was introduced through the mouth and advanced to the second portion of the duodenum. The instrument was slowly withdrawn as the mucosa was carefully examined.  Prior to withdrawal of the scope, the guidwire was placed.  The esophagus was dilated successfully.  The patient was recovered in endoscopy and discharged home in satisfactory condition. Estimated blood loss is zero unless otherwise noted in this procedure report.   ESOPHAGUS: A stricture was found at the gastroesophageal junction. The stenosis was traversable with the endoscope.   STOMACH: Moderate nodular gastritis (inflammation) was found in the gastric antrum.  Multiple biopsies were performed using cold forceps.   A few small sessile polyps were found in the gastric body.  A polypectomy was performed with a cold forceps.   Dilation was then performed at the gastroesphageal junction Dilator: Savary over guidewire Size(s): 14-17 MM Resistance: minimal Heme: yes  COMPLICATIONS: There were no immediate complications.  ENDOSCOPIC IMPRESSION: 1.   Stricture at the gastroesophageal junction 2.   MODERATE Nodular gastritis 3.   Few small GASTRIC polyps  RECOMMENDATIONS: FOLLOW A HIGH FIBER/LOW FAT DIET. PROTONIX  30 MINUTES PRIOR TO MEALS TWICE DAILY. AWAIT  BIOPSY RESULTS. FOLLOW UP IN 3 MOS. Next colonoscopy in 5-10 years.   eSigned:  Danie Binder, MD 21-Apr-2015 4:32 PM   CPT CODES: ICD CODES:  The ICD and CPT codes recommended by this software are interpretations from the data that the clinical staff has captured with the software.  The verification of the translation of this report to the ICD and CPT codes and modifiers is the sole responsibility of the health care institution and practicing physician where this report was generated.  [place]. will not be held responsible for the validity of the ICD and CPT codes included on this report.  AMA assumes no liability for data contained or not contained herein. CPT is a Designer, television/film set of the [employer].

## 2015-04-03 NOTE — Op Note (Signed)
[facility] [address] [place], [address]   COLONOSCOPY PROCEDURE REPORT  PATIENT: Carmen, [name]  MR#: [id number] BIRTHDATE: 03-16-1940 , 66  yrs. old GENDER: female ENDOSCOPIST: Danie Binder, MD REFERRED BY: PROCEDURE DATE:  04/20/2015 PROCEDURE:   Colonoscopy with cold biopsy AND snare polypectomy INDICATIONS:personal history of colonic polyps. MEDICATIONS: Promethazine (Phenergan) 12.5 mg IV, Demerol 100 mg IV, and Versed 6 mg IV  MD INITAITED SEDATION: 1248 PROCEDURE COMPLETE: 1353  DESCRIPTION OF PROCEDURE:    Physical exam was performed.  Informed consent was obtained from the patient after explaining the benefits, risks, and alternatives to procedure.  The patient was connected to monitor and placed in left lateral position. Continuous oxygen was provided by nasal cannula and IV medicine administered through an indwelling cannula.  After administration of sedation and rectal exam, the patients rectum was intubated and the EC-[id number] ([id number])  colonoscope was advanced under direct visualization to the cecum.  The scope was removed slowly by carefully examining the color, texture, anatomy, and integrity mucosa on the way out.  The patient was recovered in endoscopy and discharged home in satisfactory condition. Estimated blood loss is zero unless otherwise noted in this procedure report.    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 Three sessile polyps ranging from 2 to 75m in size were found in the transverse colon, ascending colon, and at the hepatic flexure.  A polypectomy was performed with cold forceps.  , A semi-pedunculated polyp measuring 6 mm in size was found at the hepatic flexure.  A polypectomy was performed using snare cautery.  , There was mild diverticulosis noted in the sigmoid colon with associated muscular hypertrophy.  , and Small internal hemorrhoids were found.    PREP QUALITY: excellent.  CECAL W/D TIME: 16        minutes   COMPLICATIONS: None  ENDOSCOPIC IMPRESSION: 1.   FOUR COLON POLYPS REMOVED 2.   Mild diverticulosis noted in the sigmoid colon 3.   Small internal hemorrhoids 4. 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 CONTINUE PROTONIX 30 MINs PRIOR TO MEALS TWICE DAILY. AWAIT BIOPSY RESULTS. FOLLOW UP IN 3 MOS. Next colonoscopy in 5-10 years.  eSigned:  SDanie Binder MD 0February 18, 20174:04 PM   CPT CODES: ICD CODES:  The ICD and CPT codes recommended by this software are interpretations from the data that the clinical staff has captured with the software.  The verification of the translation of this report to the ICD and CPT codes and modifiers is the sole responsibility of the health care institution and practicing physician where this report was generated.  [place] will not be held responsible for the validity of the ICD and CPT codes included on this report.  AMA assumes no liability for data contained or not contained herein. CPT is a rDesigner, television/film setof the [employer]

## 2015-04-04 ENCOUNTER — Encounter (HOSPITAL_COMMUNITY): Payer: Medicare Other

## 2015-04-08 ENCOUNTER — Encounter (HOSPITAL_COMMUNITY): Payer: Self-pay | Admitting: Gastroenterology

## 2015-04-10 ENCOUNTER — Inpatient Hospital Stay (HOSPITAL_COMMUNITY): Admission: RE | Admit: 2015-04-10 | Payer: Medicare Other | Source: Ambulatory Visit

## 2015-04-18 ENCOUNTER — Telehealth: Payer: Self-pay | Admitting: Gastroenterology

## 2015-04-18 ENCOUNTER — Encounter (HOSPITAL_COMMUNITY): Payer: Medicare Other

## 2015-04-18 ENCOUNTER — Ambulatory Visit (HOSPITAL_COMMUNITY): Payer: Medicare Other

## 2015-04-18 NOTE — Telephone Encounter (Signed)
Please call pt. She had ONE simple adenoma AND ONE HYPERPLASTIC POLYP removed.   FOLLOW A HIGH FIBER/LOW FAT DIET. AVOID ITEMS THAT CAUSE BLOATING.  CONTINUE PROTONIX. TAKE 30 MINUTES PRIOR TO MEALS TWICE DAILY.  FOLLOW UP IN 3 MOS E30 NASH/DYSPHAGIA.Marland Kitchen

## 2015-04-18 NOTE — Telephone Encounter (Signed)
OV MADE °

## 2015-04-18 NOTE — Telephone Encounter (Signed)
Pt is aware of results. 

## 2015-04-22 ENCOUNTER — Encounter (HOSPITAL_COMMUNITY)
Admission: RE | Admit: 2015-04-22 | Discharge: 2015-04-22 | Disposition: A | Discharge status: Home / Self Care | Payer: Medicare Other | Source: Ambulatory Visit | Attending: Cardiovascular Disease | Admitting: Cardiovascular Disease

## 2015-04-22 ENCOUNTER — Encounter (HOSPITAL_COMMUNITY): Payer: Self-pay

## 2015-04-22 ENCOUNTER — Inpatient Hospital Stay (HOSPITAL_COMMUNITY): Admission: RE | Admit: 2015-04-22 | Payer: Medicare Other | Source: Ambulatory Visit

## 2015-04-22 DIAGNOSIS — R079 Chest pain, unspecified: Secondary | ICD-10-CM | POA: Diagnosis not present

## 2015-04-22 LAB — NM MYOCAR MULTI W/SPECT W/WALL MOTION / EF
CHL CUP MPHR: 146 {beats}/min
CHL CUP NUCLEAR SDS: 1
CHL CUP NUCLEAR SSS: 11
CHL RATE OF PERCEIVED EXERTION: 17
CSEPEW: 4.8 METS
CSEPHR: 69 %
Exercise duration (min): 3 min
Exercise duration (sec): 9 s
LHR: 0.34
LV sys vol: 22 mL
LVDIAVOL: 62 mL
Peak HR: 102 {beats}/min
Rest HR: 74 {beats}/min
SRS: 10
TID: 1.08

## 2015-04-22 MED ORDER — TECHNETIUM TC 99M SESTAMIBI GENERIC - CARDIOLITE
30.0000 | Freq: Once | INTRAVENOUS | Status: AC | PRN
  Administered 2015-04-22: 28 via INTRAVENOUS

## 2015-04-22 MED ORDER — REGADENOSON 0.4 MG/5ML IV SOLN
INTRAVENOUS | Status: AC
  Administered 2015-04-22: 0.4 mg via INTRAVENOUS
  Filled 2015-04-22: qty 5

## 2015-04-22 MED ORDER — TECHNETIUM TC 99M SESTAMIBI - CARDIOLITE
10.0000 | Freq: Once | INTRAVENOUS | Status: AC | PRN
  Administered 2015-04-22: 11 via INTRAVENOUS

## 2015-04-22 MED ORDER — SODIUM CHLORIDE 0.9% FLUSH
INTRAVENOUS | Status: AC
  Administered 2015-04-22: 10 mL via INTRAVENOUS
  Filled 2015-04-22: qty 10

## 2015-04-23 ENCOUNTER — Telehealth: Payer: Self-pay | Admitting: *Deleted

## 2015-04-23 NOTE — Telephone Encounter (Signed)
Notes Recorded by Laurine Blazer, LPN on 4/46/9507 at 2:25 AM Patient notified. Copy to pmd. Follow up scheduled for 06/10/2015 with Dr. Bronson Ing in [place] office.

## 2015-04-23 NOTE — Telephone Encounter (Signed)
-----   Message from Herminio Commons, MD sent at 04/22/2015  5:46 PM EST ----- Normal.

## 2015-05-01 ENCOUNTER — Telehealth: Payer: Self-pay | Admitting: Gastroenterology

## 2015-05-01 NOTE — Telephone Encounter (Signed)
I called Carmen [name] and she told me about an episode that she had this Am at the [facility].  She said she had a "GRABBING PAIN" right between her breast.  She said it lasted a few minutes and they had to let her sit a few minutes. She said it is not her heart, that she has had a good cardiology work up.  She wants to know if this could be coming from a hernia that she was told that she has. She has had about 4 of these episodes, but the first one and the one today was the worst. She would like Dr. Oneida Alar to advise on what it could be and what she should do.

## 2015-05-01 NOTE — Telephone Encounter (Signed)
Pt is aware.  

## 2015-05-01 NOTE — Telephone Encounter (Signed)
Pt called asking to speak with SF or her nurse. I told her that SF was not in the office today and nurse was triaging patients at the moment and I could take a message. Questions about her hernia.   [phone]

## 2015-05-01 NOTE — Telephone Encounter (Signed)
PLEASE CALL PT. TH PAIN SHE IS EXPERIENCING IS LIKELY SPASM OF HER ESOPHAGUS DUE TO ACID EXPOSURE. SHE SHOULD AVOID THINGS THAT TRIGGER REFLUX. 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 CONTINUE PROTONIX. TAKE 30 MINUTES PRIOR TO MEALS TWICE DAILY.

## 2015-05-31 ENCOUNTER — Other Ambulatory Visit (HOSPITAL_COMMUNITY): Payer: Self-pay | Admitting: Respiratory Therapy

## 2015-05-31 DIAGNOSIS — G479 Sleep disorder, unspecified: Secondary | ICD-10-CM

## 2015-06-03 ENCOUNTER — Other Ambulatory Visit (HOSPITAL_COMMUNITY): Payer: Self-pay | Admitting: Respiratory Therapy

## 2015-06-03 DIAGNOSIS — G479 Sleep disorder, unspecified: Secondary | ICD-10-CM

## 2015-06-10 ENCOUNTER — Ambulatory Visit: Payer: Medicare Other | Admitting: Cardiovascular Disease

## 2015-07-01 ENCOUNTER — Ambulatory Visit: Payer: Medicare Other | Admitting: Gastroenterology

## 2015-07-15 ENCOUNTER — Ambulatory Visit: Payer: Medicare Other | Admitting: Gastroenterology

## 2015-07-30 ENCOUNTER — Ambulatory Visit (INDEPENDENT_AMBULATORY_CARE_PROVIDER_SITE_OTHER): Payer: Medicare Other | Admitting: Cardiovascular Disease

## 2015-07-30 ENCOUNTER — Encounter: Payer: Self-pay | Admitting: Cardiovascular Disease

## 2015-07-30 VITALS — BP 118/68 | HR 73 | Ht 63.0 in | Wt 157.0 lb

## 2015-07-30 DIAGNOSIS — E785 Hyperlipidemia, unspecified: Secondary | ICD-10-CM

## 2015-07-30 DIAGNOSIS — I517 Cardiomegaly: Secondary | ICD-10-CM

## 2015-07-30 DIAGNOSIS — I25118 Atherosclerotic heart disease of native coronary artery with other forms of angina pectoris: Secondary | ICD-10-CM

## 2015-07-30 DIAGNOSIS — I10 Essential (primary) hypertension: Secondary | ICD-10-CM

## 2015-07-30 NOTE — Patient Instructions (Signed)
Your physician wants you to follow-up in: 6 months You will receive a reminder letter in the mail two months in advance. If you don't receive a letter, please call our office to schedule the follow-up appointment.     Your physician recommends that you continue on your current medications as directed. Please refer to the Current Medication list given to you today.      Thank you for choosing [facility] !        

## 2015-07-30 NOTE — Progress Notes (Signed)
Patient ID: Carmen [name], female   DOB: [birth year], 75 y.o.   MRN: [id number]      SUBJECTIVE: The patient presents for routine follow-up of coronary artery disease with LAD stents. Echocardiogram on 11/20/13 demonstrated normal left ventricular systolic function, EF 63-89%, grade 1 diastolic dysfunction, and severe left atrial enlargement. She has preferred to avoid statin therapy. She also has diabetes. Lipids on 11/17/14 showed total cholesterol 183, triglycerides 218, HDL 31, LDL 108.  Nuclear myocardial perfusion study 04/22/15 was normal, LVEF 64%.  The patient denies any symptoms of chest pain, palpitations, shortness of breath, lightheadedness, dizziness, leg swelling, orthopnea, PND, and syncope.   Review of Systems: As per "subjective", otherwise negative.  Allergies  Allergen Reactions  . Sulfa Antibiotics Nausea And Vomiting    Current Outpatient Prescriptions  Medication Sig Dispense Refill  . amLODipine (NORVASC) 10 MG tablet Take 10 mg by mouth daily.    Marland Kitchen aspirin 81 MG chewable tablet Chew 81 mg by mouth daily.    . busPIRone (BUSPAR) 15 MG tablet Take 15 mg by mouth daily. Take 1/2 tablet daily    . fish oil-omega-3 fatty acids 1000 MG capsule Take 1 g by mouth 2 (two) times daily.     Marland Kitchen glipiZIDE (GLUCOTROL) 5 MG tablet Take 5 mg by mouth 2 (two) times daily before a meal.    . lisinopril (PRINIVIL,ZESTRIL) 10 MG tablet Take 10 mg by mouth daily.    Marland Kitchen LORazepam (ATIVAN) 0.5 MG tablet Take 0.5 mg by mouth 2 (two) times daily.    . metFORMIN (GLUCOPHAGE) 500 MG tablet Take 500 mg by mouth 2 (two) times daily with a meal.    . metoprolol succinate (TOPROL-XL) 50 MG 24 hr tablet Take 50 mg by mouth daily. Take with or immediately following a meal.    . mirtazapine (REMERON) 45 MG tablet Take 45 mg by mouth at bedtime.    . nitrofurantoin (MACRODANTIN) 50 MG capsule Take 50 mg by mouth at bedtime.     . nitroGLYCERIN (NITROSTAT) 0.4 MG SL tablet Place 1 tablet (0.4 mg  total) under the tongue every 5 (five) minutes as needed for chest pain. 25 tablet 3  . pantoprazole (PROTONIX) 40 MG tablet TAKE 1 TABLET BY MOUTH TWICE DAILY. TAKE 30 MINS PRIOR TO MEALS 60 tablet 5   No current facility-administered medications for this visit.    Past Medical History  Diagnosis Date  . Diabetes ([place])     diet controlled  . GERD (gastroesophageal reflux disease)     Esophagus dilated in the 1990s per patient  . Abnormal LFTs   . Hypertension   . Hyperlipidemia   . CAD (coronary artery disease)     Past Surgical History  Procedure Laterality Date  . Abdominal hysterectomy    . Esophageal dilation      In the 1990s  . Appendectomy    . Colonoscopy with esophagogastroduodenoscopy (egd)  02/15/2012    HTD:SKAJG sessile polyps 3-8 mm found at the cecum/ 8 polyps measuring 3-8 mm in/1 polyp measuring 6 mm in transverse colon/3-67m polyps found in the sigmoid/6 mm polyp found in the rectum/small internal hemorrhoids  . 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 Cardiac catheterization  2009    with stent  . Cataract extraction      left  . Colonoscopy N/A 04/03/2015  RMR:4 polyps/mild diverticulosis/small internal hemorrhoids  . Esophagogastroduodenoscopy N/A 04/03/2015    TMA:[id number] at the gastroesophageal junction/moderate nodular gastritis  . Savory dilation N/A 04/03/2015    Procedure: SAVORY DILATION;  Surgeon: Danie Binder, MD;  Location: [facility];  Service: Endoscopy;  Laterality: N/A;    Social History   Social History  . Marital Status: Divorced    Spouse Name: N/A  . Number of Children: 3  . Years of Education: N/A   Occupational History  . Not on file.   Social History Main Topics  . Smoking status: Never Smoker   . Smokeless tobacco: Never Used  . Alcohol Use: No  . Drug Use: No  .  Sexual Activity: Yes    Birth Control/ Protection: Surgical   Other Topics Concern  . Not on file   Social History Narrative     Filed Vitals:   07/30/15 0908  BP: 118/68  Pulse: 73  Height: 5' 3"  (1.6 m)  Weight: 157 lb (71.215 kg)  SpO2: 93%    PHYSICAL EXAM General: NAD HEENT: Normal. Neck: No JVD, no thyromegaly. Lungs: Clear to auscultation bilaterally with normal respiratory effort. CV: Nondisplaced PMI.  Regular rate and rhythm, normal S1/S2, no S3/S4, no murmur. No pretibial or periankle edema.  No carotid bruit.   Abdomen: Soft, nontender, no distention.  Neurologic: Alert and oriented.  Psych: Normal affect. Skin: Normal. Musculoskeletal: No gross deformities.    ECG: Most recent ECG reviewed.      ASSESSMENT AND PLAN: 1. CAD with LAD stents: Symptomatically stable. Normal stress test 04/2015. Currently on ASA and metoprolol, and not on a statin, as per her preference and presumably due to h/o elevated LFT's. Takes fish oil.   2. Essential HTN: Controlled. No changes to therapy.  3. Hyperlipidemia: On fish oil. Most recent results noted above. She prefers to remain off statin therapy.  4. Severe left atrial enlargement with palpitations: At risk for development of atrial fibrillation given history of CAD and hypertension, along with age and gender. Will monitor.  Dispo: f/u 6 months.   Kate Sable, M.D., F.A.C.C.

## 2016-03-19 ENCOUNTER — Ambulatory Visit: Payer: Medicare Other | Admitting: Cardiovascular Disease

## 2016-04-03 ENCOUNTER — Ambulatory Visit: Payer: Medicare Other | Admitting: Cardiovascular Disease

## 2016-04-30 ENCOUNTER — Ambulatory Visit (INDEPENDENT_AMBULATORY_CARE_PROVIDER_SITE_OTHER): Payer: Medicare Other | Admitting: Cardiovascular Disease

## 2016-04-30 ENCOUNTER — Encounter: Payer: Self-pay | Admitting: Cardiovascular Disease

## 2016-04-30 VITALS — BP 124/62 | HR 81 | Ht 66.0 in | Wt 148.0 lb

## 2016-04-30 DIAGNOSIS — I10 Essential (primary) hypertension: Secondary | ICD-10-CM | POA: Diagnosis not present

## 2016-04-30 DIAGNOSIS — E7800 Pure hypercholesterolemia, unspecified: Secondary | ICD-10-CM | POA: Diagnosis not present

## 2016-04-30 DIAGNOSIS — I517 Cardiomegaly: Secondary | ICD-10-CM | POA: Diagnosis not present

## 2016-04-30 DIAGNOSIS — I2510 Atherosclerotic heart disease of native coronary artery without angina pectoris: Secondary | ICD-10-CM

## 2016-04-30 NOTE — Progress Notes (Signed)
SUBJECTIVE: The patient presents for routine follow-up of coronary artery disease with LAD stents. Echocardiogram on 11/20/13 demonstrated normal left ventricular systolic function, EF 61-53%, grade 1 diastolic dysfunction, and severe left atrial enlargement. She has preferred to avoid statin therapy. She also has diabetes.  Nuclear myocardial perfusion study 04/22/15 was normal, LVEF 64%.  The patient denies any symptoms of chest pain, palpitations, shortness of breath, lightheadedness, dizziness, leg swelling, orthopnea, PND, and syncope.  ECG performed in the office today which I personally reviewed demonstrates normal sinus rhythm with no ischemic ST segment or T-wave abnormalities, nor any arrhythmias.  She enjoys dancing frequently and also volunteers for [employer].   Review of Systems: As per "subjective", otherwise negative.  Allergies  Allergen Reactions  . Sulfa Antibiotics Nausea And Vomiting    Current Outpatient Prescriptions  Medication Sig Dispense Refill  . amLODipine (NORVASC) 10 MG tablet Take 10 mg by mouth daily.    Marland Kitchen aspirin 81 MG chewable tablet Chew 81 mg by mouth daily.    . busPIRone (BUSPAR) 15 MG tablet Take 15 mg by mouth daily. Take 1/2 tablet daily    . fish oil-omega-3 fatty acids 1000 MG capsule Take 1 g by mouth 2 (two) times daily.     Marland Kitchen glipiZIDE (GLUCOTROL) 5 MG tablet Take 5 mg by mouth 2 (two) times daily before a meal.    . lisinopril (PRINIVIL,ZESTRIL) 10 MG tablet Take 10 mg by mouth daily.    Marland Kitchen LORazepam (ATIVAN) 0.5 MG tablet Take 0.5 mg by mouth 2 (two) times daily.    . metFORMIN (GLUCOPHAGE) 500 MG tablet Take 500 mg by mouth 2 (two) times daily with a meal.    . metoprolol succinate (TOPROL-XL) 50 MG 24 hr tablet Take 50 mg by mouth daily. Take with or immediately following a meal.    . mirtazapine (REMERON) 45 MG tablet Take 45 mg by mouth at bedtime.    . nitrofurantoin (MACRODANTIN) 50 MG capsule Take 50 mg by mouth at  bedtime.     . nitroGLYCERIN (NITROSTAT) 0.4 MG SL tablet Place 1 tablet (0.4 mg total) under the tongue every 5 (five) minutes as needed for chest pain. 25 tablet 3  . pantoprazole (PROTONIX) 40 MG tablet TAKE 1 TABLET BY MOUTH TWICE DAILY. TAKE 30 MINS PRIOR TO MEALS 60 tablet 5  . trimethoprim (TRIMPEX) 100 MG tablet Take 100 mg by mouth daily.      No current facility-administered medications for this visit.     Past Medical History:  Diagnosis Date  . Abnormal LFTs   . CAD (coronary artery disease)   . Diabetes ([place])    diet controlled  . GERD (gastroesophageal reflux disease)    Esophagus dilated in the 1990s per patient  . Hyperlipidemia   . Hypertension     Past Surgical History:  Procedure Laterality Date  . ABDOMINAL HYSTERECTOMY    . APPENDECTOMY    . CARDIAC CATHETERIZATION  2009   with stent  . CATARACT EXTRACTION     left  . COLONOSCOPY N/A 04/03/2015   RMR:4 polyps/mild diverticulosis/small internal hemorrhoids  . COLONOSCOPY WITH ESOPHAGOGASTRODUODENOSCOPY (EGD)  02/15/2012   PHK:FEXMD sessile polyps 3-8 mm found at the cecum/ 8 polyps measuring 3-8 mm in/1 polyp measuring 6 mm in transverse colon/3-79m polyps found in the sigmoid/6 mm polyp found in the rectum/small internal hemorrhoids  . ESOPHAGEAL DILATION     In the 1990s  . 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 ESOPHAGOGASTRODUODENOSCOPY N/A 04/03/2015   KCL:[id number] at the gastroesophageal junction/moderate nodular gastritis  . SAVORY DILATION N/A 04/03/2015   Procedure: SAVORY DILATION;  Surgeon: Danie Binder, MD;  Location: [facility];  Service: Endoscopy;  Laterality: N/A;    Social History   Social History  . Marital status: Divorced    Spouse name: N/A  . Number of children: 3  . Years of education: N/A   Occupational History  . Not on  file.   Social History Main Topics  . Smoking status: Never Smoker  . Smokeless tobacco: Never Used  . Alcohol use No  . Drug use: No  . Sexual activity: Yes    Birth control/ protection: Surgical   Other Topics Concern  . Not on file   Social History Narrative  . No narrative on file     Vitals:   04/30/16 1128  BP: 124/62  Pulse: 81  SpO2: 95%  Weight: 148 lb (67.1 kg)  Height: 5' 6"  (1.676 m)    PHYSICAL EXAM General: NAD HEENT: Normal. Neck: No JVD, no thyromegaly. Lungs: Clear to auscultation bilaterally with normal respiratory effort. CV: Nondisplaced PMI.  Regular rate and rhythm, normal S1/S2, no S3/S4, no murmur. No pretibial or periankle edema.  No carotid bruit.   Abdomen: Soft, nontender, no distention.  Neurologic: Alert and oriented.  Psych: Normal affect. Skin: Normal. Musculoskeletal: No gross deformities.    ECG: Most recent ECG reviewed.      ASSESSMENT AND PLAN:  1. CAD with LAD stents: Symptomatically stable. Normal stress test 04/2015. Currently on ASA and metoprolol, and not on a statin, as per her preference and presumably due to h/o elevated LFT's. Takes fish oil.   2. Essential HTN: Controlled. No changes to therapy.  3. Hyperlipidemia: On fish oil. She prefers to remain off statin therapy.  4. Severe left atrial enlargement with palpitations: No significant symptoms at present. At risk for development of atrial fibrillation given history of CAD and hypertension, along with age and gender. Will monitor.   Dispo: fu 1 yr   Kate Sable, M.D., F.A.C.C.

## 2016-04-30 NOTE — Patient Instructions (Addendum)
Your physician wants you to follow-up in: 1 year Dr Virgina Jock will receive a reminder letter in the mail two months in advance. If you don't receive a letter, please call our office to schedule the follow-up appointment.     Your physician recommends that you continue on your current medications as directed. Please refer to the Current Medication list given to you today.    If you need a refill on your cardiac medications before your next appointment, please call your pharmacy.      Thank you for choosing [facility] !

## 2016-07-04 ENCOUNTER — Encounter (HOSPITAL_COMMUNITY): Payer: Self-pay | Admitting: Emergency Medicine

## 2016-07-04 ENCOUNTER — Observation Stay (HOSPITAL_COMMUNITY)
Admission: EM | Admit: 2016-07-04 | Discharge: 2016-07-05 | Disposition: A | Discharge status: Home / Self Care | Payer: Medicare Other | Attending: Internal Medicine | Admitting: Internal Medicine

## 2016-07-04 ENCOUNTER — Emergency Department (HOSPITAL_COMMUNITY): Payer: Medicare Other

## 2016-07-04 DIAGNOSIS — Z7984 Long term (current) use of oral hypoglycemic drugs: Secondary | ICD-10-CM | POA: Insufficient documentation

## 2016-07-04 DIAGNOSIS — Z79899 Other long term (current) drug therapy: Secondary | ICD-10-CM | POA: Diagnosis not present

## 2016-07-04 DIAGNOSIS — R072 Precordial pain: Principal | ICD-10-CM | POA: Insufficient documentation

## 2016-07-04 DIAGNOSIS — Z7982 Long term (current) use of aspirin: Secondary | ICD-10-CM | POA: Insufficient documentation

## 2016-07-04 DIAGNOSIS — R079 Chest pain, unspecified: Secondary | ICD-10-CM | POA: Diagnosis present

## 2016-07-04 DIAGNOSIS — K219 Gastro-esophageal reflux disease without esophagitis: Secondary | ICD-10-CM | POA: Diagnosis not present

## 2016-07-04 DIAGNOSIS — E119 Type 2 diabetes mellitus without complications: Secondary | ICD-10-CM | POA: Insufficient documentation

## 2016-07-04 DIAGNOSIS — I2510 Atherosclerotic heart disease of native coronary artery without angina pectoris: Secondary | ICD-10-CM | POA: Diagnosis not present

## 2016-07-04 DIAGNOSIS — F419 Anxiety disorder, unspecified: Secondary | ICD-10-CM | POA: Diagnosis not present

## 2016-07-04 DIAGNOSIS — R0789 Other chest pain: Secondary | ICD-10-CM | POA: Diagnosis present

## 2016-07-04 DIAGNOSIS — I10 Essential (primary) hypertension: Secondary | ICD-10-CM | POA: Diagnosis present

## 2016-07-04 LAB — BASIC METABOLIC PANEL
Anion gap: 8 (ref 5–15)
BUN: 15 mg/dL (ref 6–20)
CALCIUM: 8.6 mg/dL — AB (ref 8.9–10.3)
CHLORIDE: 107 mmol/L (ref 101–111)
CO2: 26 mmol/L (ref 22–32)
CREATININE: 0.82 mg/dL (ref 0.44–1.00)
GFR calc Af Amer: >60 mL/min (ref >60)
Glucose, Bld: 181 mg/dL — ABNORMAL HIGH (ref 65–99)
Potassium: 3.7 mmol/L (ref 3.5–5.1)
Sodium: 141 mmol/L (ref 135–145)

## 2016-07-04 LAB — CBC
HCT: 36 % (ref 36.0–46.0)
Hemoglobin: 11 g/dL — ABNORMAL LOW (ref 12.0–15.0)
MCH: 24.3 pg — ABNORMAL LOW (ref 26.0–34.0)
MCHC: 30.6 g/dL (ref 30.0–36.0)
MCV: 79.6 fL (ref 78.0–100.0)
PLATELETS: 227 10*3/uL (ref 150–400)
RBC: 4.52 MIL/uL (ref 3.87–5.11)
RDW: 15.9 % — AB (ref 11.5–15.5)
WBC: 11.7 10*3/uL — AB (ref 4.0–10.5)

## 2016-07-04 LAB — TROPONIN I: Troponin I: <0.03 ng/mL (ref <0.03)

## 2016-07-04 MED ORDER — NITROGLYCERIN 0.4 MG SL SUBL: 0.4000 mg | SUBLINGUAL_TABLET | SUBLINGUAL | Status: DC | PRN

## 2016-07-04 MED ORDER — ENOXAPARIN SODIUM 40 MG/0.4ML ~~LOC~~ SOLN: 40.0000 mg | SUBCUTANEOUS | Status: DC

## 2016-07-04 MED ORDER — MIRTAZAPINE 30 MG PO TABS
45.0000 mg | ORAL_TABLET | Freq: Every day | ORAL | Status: DC
  Filled 2016-07-04 (×2): qty 1

## 2016-07-04 MED ORDER — OMEGA-3-ACID ETHYL ESTERS 1 G PO CAPS
1.0000 g | ORAL_CAPSULE | Freq: Two times a day (BID) | ORAL | Status: DC
  Administered 2016-07-05: 1 g via ORAL
  Filled 2016-07-04: qty 1

## 2016-07-04 MED ORDER — BUSPIRONE HCL 15 MG PO TABS
7.5000 mg | ORAL_TABLET | Freq: Every day | ORAL | Status: DC
  Administered 2016-07-05: 7.5 mg via ORAL
  Filled 2016-07-04: qty 2

## 2016-07-04 MED ORDER — ASPIRIN 300 MG RE SUPP: 300.0000 mg | RECTAL | Status: DC

## 2016-07-04 MED ORDER — INSULIN ASPART 100 UNIT/ML ~~LOC~~ SOLN
0.0000 [IU] | Freq: Three times a day (TID) | SUBCUTANEOUS | Status: DC
  Administered 2016-07-05: 2 [IU] via SUBCUTANEOUS

## 2016-07-04 MED ORDER — LABETALOL HCL 5 MG/ML IV SOLN: 5.0000 mg | INTRAVENOUS | Status: DC | PRN

## 2016-07-04 MED ORDER — TRIMETHOPRIM 100 MG PO TABS
100.0000 mg | ORAL_TABLET | Freq: Every day | ORAL | Status: DC
  Filled 2016-07-04 (×2): qty 1

## 2016-07-04 MED ORDER — ACETAMINOPHEN 325 MG PO TABS: 650.0000 mg | ORAL_TABLET | ORAL | Status: DC | PRN

## 2016-07-04 MED ORDER — ASPIRIN 81 MG PO CHEW: 324.0000 mg | CHEWABLE_TABLET | ORAL | Status: DC

## 2016-07-04 MED ORDER — OMEGA-3 FATTY ACIDS 1000 MG PO CAPS: 1.0000 g | ORAL_CAPSULE | Freq: Two times a day (BID) | ORAL | Status: DC

## 2016-07-04 MED ORDER — AMLODIPINE BESYLATE 5 MG PO TABS
10.0000 mg | ORAL_TABLET | Freq: Every day | ORAL | Status: DC
  Administered 2016-07-05: 10 mg via ORAL
  Filled 2016-07-04: qty 2

## 2016-07-04 MED ORDER — ASPIRIN 81 MG PO CHEW
81.0000 mg | CHEWABLE_TABLET | Freq: Every day | ORAL | Status: DC
  Administered 2016-07-05: 81 mg via ORAL
  Filled 2016-07-04: qty 1

## 2016-07-04 MED ORDER — PANTOPRAZOLE SODIUM 40 MG PO TBEC
40.0000 mg | DELAYED_RELEASE_TABLET | Freq: Two times a day (BID) | ORAL | Status: DC
  Administered 2016-07-05: 40 mg via ORAL
  Filled 2016-07-04: qty 1

## 2016-07-04 MED ORDER — LISINOPRIL 10 MG PO TABS
10.0000 mg | ORAL_TABLET | Freq: Every day | ORAL | Status: DC
  Administered 2016-07-05: 10 mg via ORAL
  Filled 2016-07-04: qty 1

## 2016-07-04 MED ORDER — METOPROLOL SUCCINATE ER 50 MG PO TB24
50.0000 mg | ORAL_TABLET | Freq: Every day | ORAL | Status: DC
  Administered 2016-07-05: 50 mg via ORAL
  Filled 2016-07-04: qty 1

## 2016-07-04 MED ORDER — ONDANSETRON HCL 4 MG/2ML IJ SOLN: 4.0000 mg | Freq: Four times a day (QID) | INTRAMUSCULAR | Status: DC | PRN

## 2016-07-04 MED ORDER — LORAZEPAM 0.5 MG PO TABS
0.5000 mg | ORAL_TABLET | Freq: Two times a day (BID) | ORAL | Status: DC
  Administered 2016-07-05: 0.5 mg via ORAL
  Filled 2016-07-04: qty 1

## 2016-07-04 MED ORDER — INSULIN ASPART 100 UNIT/ML ~~LOC~~ SOLN: 0.0000 [IU] | Freq: Every day | SUBCUTANEOUS | Status: DC

## 2016-07-04 NOTE — ED Triage Notes (Signed)
Pt reports having pain under left breast starting last night after going to the senior dance.  Thought she may have pulled a muscle.  Pain went away and returned today.

## 2016-07-04 NOTE — ED Provider Notes (Signed)
Emergency Department Provider Note   I have reviewed the triage vital signs and the nursing notes.   HISTORY  Chief Complaint Chest Pain   HPI Carmen [name] is a 76 y.o. female with PMH of CAD, DM, GERD, HLD, and HTN resents to the emergency room in for evaluation of intermittent left chest pain. Patient states that she had pain yesterday which resolved spontaneously. She describes it as a "soreness" and "pressure" but felt unlikely her prior pain requiring stent placement. She went to a senior's dance last night and was very active without pain. This morning she had return of pain intermittently throughout the day which seems stronger. No associated dyspnea, diaphoresis, fever, chills, productive cough. Not worse with movement or palpation. No injury to the chest wall.    Past Medical History:  Diagnosis Date  . Abnormal LFTs   . CAD (coronary artery disease)   . Diabetes ([place])    diet controlled  . GERD (gastroesophageal reflux disease)    Esophagus dilated in the 1990s per patient  . Hyperlipidemia   . Hypertension     Patient Active Problem List   Diagnosis Date Noted  . Chest pain 07/04/2016  . Anxiety 07/04/2016  . Dysphagia   . Dysphagia, idiopathic 03/20/2015  . CAD (coronary artery disease) 11/17/2013  . HTN (hypertension) 11/17/2013  . Nonalcoholic steatohepatitis (NASH) 01/20/2012  . GERD (gastroesophageal reflux disease) 01/20/2012  . History of colonic polyps 01/20/2012    Past Surgical History:  Procedure Laterality Date  . ABDOMINAL HYSTERECTOMY    . APPENDECTOMY    . CARDIAC CATHETERIZATION  2009   with stent  . CATARACT EXTRACTION     left  . COLONOSCOPY N/A 04/03/2015   RMR:4 polyps/mild diverticulosis/small internal hemorrhoids  . COLONOSCOPY WITH ESOPHAGOGASTRODUODENOSCOPY (EGD)  02/15/2012   CBS:WHQPR sessile polyps 3-8 mm found at the cecum/ 8 polyps measuring 3-8 mm in/1 polyp measuring 6 mm in transverse colon/3-35m polyps found in the  sigmoid/6 mm polyp found in the rectum/small internal hemorrhoids  . ESOPHAGEAL DILATION     In the 1990s  . 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 ESOPHAGOGASTRODUODENOSCOPY N/A 04/03/2015   RFFM:[id number] the gastroesophageal junction/moderate nodular gastritis  . SAVORY DILATION N/A 04/03/2015   Procedure: SAVORY DILATION;  Surgeon: SDanie Binder MD;  [facility]: [facility];  Service: Endoscopy;  Laterality: N/A;      Allergies Sulfa antibiotics  Family History  Problem Relation Age of Onset  . Heart attack Father   . Kidney disease Mother   . Diabetes Brother   . Colon cancer Neg Hx   . Liver disease Neg Hx     Social History Social History  Substance Use Topics  . Smoking status: Never Smoker  . Smokeless tobacco: Never Used  . Alcohol use No    Review of Systems  Constitutional: No fever/chills Eyes: No visual changes. ENT: No sore throat. Cardiovascular: Denies chest pain. Respiratory: Denies shortness of breath. Gastrointestinal: No abdominal pain.  No nausea, no vomiting.  No diarrhea.  No constipation. Genitourinary: Negative for dysuria. Musculoskeletal: Negative for back pain. Skin: Negative for rash. Neurological: Negative for headaches, focal weakness or numbness.  10-point ROS otherwise negative.  ____________________________________________   PHYSICAL EXAM:  VITAL SIGNS: ED Triage Vitals [07/04/16 1750]  Enc Vitals Group     BP 140/64  Pulse Rate 86     Resp 18     Temp 98.1 F (36.7 C)     Temp Source Oral     SpO2 100 %     Weight 160 lb (72.6 kg)     Height 5' 5"  (1.651 m)     Pain Score 9   Constitutional: Alert and oriented. Well appearing and in no acute distress. Eyes: Conjunctivae are normal.  Head: Atraumatic. Nose: No congestion/rhinnorhea. Mouth/Throat: Mucous  membranes are moist.  Oropharynx non-erythematous. Neck: No stridor.  Cardiovascular: Normal rate, regular rhythm. Good peripheral circulation. Grossly normal heart sounds.   Respiratory: Normal respiratory effort.  No retractions. Lungs CTAB. Gastrointestinal: Soft and nontender. No distention.  Musculoskeletal: No lower extremity tenderness nor edema. No gross deformities of extremities. Neurologic:  Normal speech and language. No gross focal neurologic deficits are appreciated.  Skin:  Skin is warm, dry and intact. No rash noted.  ____________________________________________   LABS (all labs ordered are listed, but only abnormal results are displayed)  Labs Reviewed  BASIC METABOLIC PANEL - Abnormal; Notable for the following:       Result Value   Glucose, Bld 181 (*)    Calcium 8.6 (*)    All other components within normal limits  CBC - Abnormal; Notable for the following:    WBC 11.7 (*)    Hemoglobin 11.0 (*)    MCH 24.3 (*)    RDW 15.9 (*)    All other components within normal limits  TROPONIN I  TROPONIN I  TROPONIN I  BASIC METABOLIC PANEL  CBC  HEMOGLOBIN A1C  TROPONIN I   ____________________________________________  EKG   EKG Interpretation  Date/Time:  Saturday Jul 04 2016 19:42:42 EDT Ventricular Rate:  76 PR Interval:    QRS Duration: 99 QT Interval:  405 QTC Calculation: 456 R Axis:   8 Text Interpretation:  Sinus rhythm Baseline wander in lead(s) V6 No STEMI.  Confirmed by Pepper Kerrick MD, Joelys Staubs [phone]) on 07/04/2016 9:15:53 PM       ____________________________________________  RADIOLOGY  Dg Chest 2 View  Result Date: 07/04/2016 CLINICAL DATA:  76 year old female with left chest pain for 1 day. Initial encounter. EXAM: CHEST  2 VIEW COMPARISON:  03/08/2012 radiographs FINDINGS: Cardiomegaly again noted. There is no evidence of focal airspace disease, pulmonary edema, suspicious pulmonary nodule/mass, pleural effusion, or pneumothorax. No acute bony  abnormalities are identified. IMPRESSION: Cardiomegaly without evidence of acute cardiopulmonary disease. Electronically Signed   By: Margarette Canada M.D.   On: 07/04/2016 19:01    ____________________________________________   PROCEDURES  Procedure(s) performed:   Procedures  None ____________________________________________   INITIAL IMPRESSION / ASSESSMENT AND PLAN / ED COURSE  Pertinent labs & imaging results that were available during my care of the patient were reviewed by me and considered in my medical decision making (see chart for details).  Patient resents to the emergency department for evaluation of chest pain and her minimally over the past 24 hours. No pain currently. Patient does have history of coronary artery disease. She was very active last night at a dance with no pain. No tenderness to palpation of the chest wall. She took an aspirin prior to arrival. Patient has multiple risk factors for acute coronary syndrome suspicion for this diagnosis is elevated especially his symptoms are not reproducible on exam. EKG from triage shows no acute ischemia. Plan for chest x-ray, labs, reevaluation. Patient's primary Cardiologist is Dr. Bronson Ing.   Discussed patient's case with hospitalist,  Dr. Myna Hidalgo. Patient and family (if present) updated with plan. Care transferred to hospitalist service.  I reviewed all nursing notes, vitals, pertinent old records, EKGs, labs, imaging (as available).  ____________________________________________  FINAL CLINICAL IMPRESSION(S) / ED DIAGNOSES  Final diagnoses:  Precordial chest pain     MEDICATIONS GIVEN DURING THIS VISIT:  Medications  trimethoprim (TRIMPEX) tablet 100 mg (not administered)  metoprolol succinate (TOPROL-XL) 24 hr tablet 50 mg (not administered)  lisinopril (PRINIVIL,ZESTRIL) tablet 10 mg (not administered)  mirtazapine (REMERON) tablet 45 mg (not administered)  busPIRone (BUSPAR) tablet 7.5 mg (not administered)    LORazepam (ATIVAN) tablet 0.5 mg (0.5 mg Oral Not Given 07/04/16 2156)  pantoprazole (PROTONIX) EC tablet 40 mg (not administered)  aspirin chewable tablet 81 mg (not administered)  amLODipine (NORVASC) tablet 10 mg (not administered)  aspirin chewable tablet 324 mg (324 mg Oral Not Given 07/04/16 2157)    Or  aspirin suppository 300 mg ( Rectal See Alternative 07/04/16 2157)  nitroGLYCERIN (NITROSTAT) SL tablet 0.4 mg (not administered)  acetaminophen (TYLENOL) tablet 650 mg (not administered)  ondansetron (ZOFRAN) injection 4 mg (not administered)  enoxaparin (LOVENOX) injection 40 mg (not administered)  insulin aspart (novoLOG) injection 0-15 Units (not administered)  insulin aspart (novoLOG) injection 0-5 Units (0 Units Subcutaneous Not Given 07/04/16 2156)  labetalol (NORMODYNE,TRANDATE) injection 5-10 mg (not administered)  omega-3 acid ethyl esters (LOVAZA) capsule 1 g (1 g Oral Not Given 07/04/16 2157)     NEW OUTPATIENT MEDICATIONS STARTED DURING THIS VISIT:  None   Note:  This document was prepared using Dragon voice recognition software and may include unintentional dictation errors.  Nanda Quinton, MD Emergency Medicine   Noha Milberger, [facility], MD 07/04/16 [phone]

## 2016-07-04 NOTE — H&P (Signed)
History and Physical    Carmen [name] GBT:[id number] DOB: [birth year] DOA: 07/04/2016  PCP: [facility]   Patient coming from: Home  Chief Complaint: Chest pain  HPI: Carmen [name] is a 76 y.o. female with medical history significant for hypertension, hyperlipidemia, type 2 diabetes mellitus, anxiety, GERD, and coronary artery disease with stent to LAD who presents to the emergency department with chest pain. Patient reports that she had been in her usual state of health until yesterday when she developed an acute discomfort under the left breast described as a "grabbing," or "tightness," lasting 5-10 minutes before resolving spontaneously, associated with mild nausea, but no diaphoresis or significant dyspnea. Patient went dancing last night with her boyfriend, did not dance quite as long as she usually does secondary to increased fatigue, but did not experience any dyspnea or chest discomfort with that exertion. After returning home, however, she noted a recurrence in the chest discomfort, again resolving spontaneously after 5-10 minutes. She reports taking 2 baby aspirin with each of the 2 episodes yesterday. She woke in her usual state today and was having an uneventful day until she experienced a recurrence in her symptoms this evening, described as longer lasting and more intense, but with the same character as the symptoms experienced yesterday. She had never experienced similar discomfort previously, noting that just prior to her stent placed in 2009, she experienced an intense central chest pressure unlike her current symptoms.  ED Course: Upon arrival to the ED, patient is found to be afebrile, saturating well on room air, and with vital signs stable. EKG features a normal sinus rhythm and chest x-ray is notable for cardiomegaly without acute cardiopulmonary disease. Chemistry panels unremarkable, CBC is notable for a mild leukocytosis to 11,700 and a mild  normocytic anemia with hemoglobin of 11.0. Troponin is undetectable. Patient remained hemodynamically stable in the emergency department, in no apparent respiratory distress, and free of any anginal complaints. She will be observed on the telemetry unit for ongoing evaluation and management of chest pain, now resolved, with history of CAD and concern for possible ACS.   Review of Systems:  All other systems reviewed and apart from HPI, are negative.  Past Medical History:  Diagnosis Date  . Abnormal LFTs   . CAD (coronary artery disease)   . Diabetes ([place])    diet controlled  . GERD (gastroesophageal reflux disease)    Esophagus dilated in the 1990s per patient  . Hyperlipidemia   . Hypertension     Past Surgical History:  Procedure Laterality Date  . ABDOMINAL HYSTERECTOMY    . APPENDECTOMY    . CARDIAC CATHETERIZATION  2009   with stent  . CATARACT EXTRACTION     left  . COLONOSCOPY N/A 04/03/2015   RMR:4 polyps/mild diverticulosis/small internal hemorrhoids  . COLONOSCOPY WITH ESOPHAGOGASTRODUODENOSCOPY (EGD)  02/15/2012   XTG:GYIRS sessile polyps 3-8 mm found at the cecum/ 8 polyps measuring 3-8 mm in/1 polyp measuring 6 mm in transverse colon/3-66m polyps found in the sigmoid/6 mm polyp found in the rectum/small internal hemorrhoids  . ESOPHAGEAL DILATION     In the 1990s  . 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 ESOPHAGOGASTRODUODENOSCOPY N/A 04/03/2015   RWNI:[id number] the gastroesophageal junction/moderate nodular gastritis  . SAVORY DILATION N/A 04/03/2015   Procedure: SAVORY DILATION;  Surgeon: SDanie Binder MD;  [facility];  Service: Endoscopy;  Laterality: N/A;     reports that she has never smoked. She has never used smokeless tobacco. She reports that she does not drink alcohol or use  drugs.  Allergies  Allergen Reactions  . Sulfa Antibiotics Nausea And Vomiting    Family History  Problem Relation Age of Onset  . Heart attack Father   . Kidney disease Mother   . Diabetes Brother   . Colon cancer Neg Hx   . Liver disease Neg Hx      Prior to Admission medications   Medication Sig Start Date End Date Taking? Authorizing Provider  amLODipine (NORVASC) 10 MG tablet Take 10 mg by mouth daily.    [provider]  aspirin 81 MG chewable tablet Chew 81 mg by mouth daily.    [provider]  busPIRone (BUSPAR) 15 MG tablet Take 15 mg by mouth daily. Take 1/2 tablet daily    [provider]  fish oil-omega-3 fatty acids 1000 MG capsule Take 1 g by mouth 2 (two) times daily.     [provider]  glipiZIDE (GLUCOTROL) 5 MG tablet Take 5 mg by mouth 2 (two) times daily before a meal.    [provider]  lisinopril (PRINIVIL,ZESTRIL) 10 MG tablet Take 10 mg by mouth daily.    [provider]  LORazepam (ATIVAN) 0.5 MG tablet Take 0.5 mg by mouth 2 (two) times daily.    [provider]  metFORMIN (GLUCOPHAGE) 500 MG tablet Take 500 mg by mouth 2 (two) times daily with a meal.    [provider]  metoprolol succinate (TOPROL-XL) 50 MG 24 hr tablet Take 50 mg by mouth daily. Take with or immediately following a meal.    [provider]  mirtazapine (REMERON) 45 MG tablet Take 45 mg by mouth at bedtime.    [provider]  nitrofurantoin (MACRODANTIN) 50 MG capsule Take 50 mg by mouth at bedtime.  11/01/14   [provider]  nitroGLYCERIN (NITROSTAT) 0.4 MG SL tablet Place 1 tablet (0.4 mg total) under the tongue every 5 (five) minutes as needed for chest pain. 03/29/15   Herminio Commons, MD  pantoprazole (PROTONIX) 40 MG tablet TAKE 1 TABLET BY MOUTH TWICE DAILY. TAKE 30 MINS PRIOR TO MEALS 07/06/13   Annitta Needs, NP  trimethoprim (TRIMPEX) 100 MG tablet Take 100 mg by mouth daily.   04/24/16   [provider]    Physical Exam: Vitals:   07/04/16 1750 07/04/16 1847 07/04/16 1854 07/04/16 1900  BP: 140/64   (!) 144/67  Pulse: 86 73 72 71  Resp: 18 15 17 18   Temp: 98.1 F (36.7 C)     TempSrc: Oral     SpO2: 100% 97% 98% 100%  Weight: 72.6 kg (160 lb)     Height: 5' 5"  (1.651 m)         Constitutional: No respiratory distress. Anxious. No pallor, no diaphoresis.  Eyes: PERTLA, lids and conjunctivae normal ENMT: Mucous membranes are moist. Posterior pharynx clear of any exudate or lesions.   Neck: normal, supple, no masses, no thyromegaly Respiratory: clear to auscultation bilaterally, no wheezing, no crackles. Normal respiratory effort.  Cardiovascular: S1 & S2 heard, regular rate and rhythm. No extremity edema. No significant JVD. Abdomen: No distension, no tenderness, no masses palpated. Bowel sounds normal.  Musculoskeletal: no clubbing / cyanosis. No joint deformity upper and lower extremities.    Skin: no significant rashes,  lesions, ulcers. Warm, dry, well-perfused. Neurologic: CN 2-12 grossly intact. Sensation intact, DTR normal. Strength 5/5 in all 4 limbs.  Psychiatric: Alert and oriented x 3. Normal mood and affect.     Labs on Admission: I have personally reviewed following labs and imaging studies  CBC:  Recent Labs Lab 07/04/16 1852  WBC 11.7*  HGB 11.0*  HCT 36.0  MCV 79.6  PLT 505   Basic Metabolic Panel:  Recent Labs Lab 07/04/16 1852  NA 141  K 3.7  CL 107  CO2 26  GLUCOSE 181*  BUN 15  CREATININE 0.82  CALCIUM 8.6*   GFR: Estimated Creatinine Clearance: 59.1 mL/min (by C-G formula based on SCr of 0.82 mg/dL). Liver Function Tests: No results for input(s): AST, ALT, ALKPHOS, BILITOT, PROT, ALBUMIN in the last 168 hours. No results for input(s): LIPASE, AMYLASE in the last 168 hours. No results for input(s): AMMONIA in the last 168 hours. Coagulation Profile: No results for input(s): INR, PROTIME in the last  168 hours. Cardiac Enzymes:  Recent Labs Lab 07/04/16 1852  TROPONINI <0.03   BNP (last 3 results) No results for input(s): PROBNP in the last 8760 hours. HbA1C: No results for input(s): HGBA1C in the last 72 hours. CBG: No results for input(s): GLUCAP in the last 168 hours. Lipid Profile: No results for input(s): CHOL, HDL, LDLCALC, TRIG, CHOLHDL, LDLDIRECT in the last 72 hours. Thyroid Function Tests: No results for input(s): TSH, T4TOTAL, FREET4, T3FREE, THYROIDAB in the last 72 hours. Anemia Panel: No results for input(s): VITAMINB12, FOLATE, FERRITIN, TIBC, IRON, RETICCTPCT in the last 72 hours. Urine analysis:    Component Value Date/Time   COLORURINE YELLOW 04/04/2012 1651   APPEARANCEUR CLEAR 04/04/2012 1651   LABSPEC 1.010 04/04/2012 1651   PHURINE 6.5 04/04/2012 1651   GLUCOSEU NEGATIVE 04/04/2012 1651   HGBUR SMALL (A) 04/04/2012 1651   BILIRUBINUR NEGATIVE 04/04/2012 1651   KETONESUR NEGATIVE 04/04/2012 1651   PROTEINUR TRACE (A) 04/04/2012 1651   UROBILINOGEN 0.2 04/04/2012 1651   NITRITE NEGATIVE 04/04/2012 1651   LEUKOCYTESUR NEGATIVE 04/04/2012 1651   Sepsis Labs: @LABRCNTIP (procalcitonin:4,lacticidven:4) )No results found for this or any previous visit (from the past 240 hour(s)).   Radiological Exams on Admission: Dg Chest 2 View  Result Date: 07/04/2016 CLINICAL DATA:  76 year old female with left chest pain for 1 day. Initial encounter. EXAM: CHEST  2 VIEW COMPARISON:  03/08/2012 radiographs FINDINGS: Cardiomegaly again noted. There is no evidence of focal airspace disease, pulmonary edema, suspicious pulmonary nodule/mass, pleural effusion, or pneumothorax. No acute bony abnormalities are identified. IMPRESSION: Cardiomegaly without evidence of acute cardiopulmonary disease. Electronically Signed   By: Margarette Canada M.D.   On: 07/04/2016 19:01    EKG: Independently reviewed. Normal sinus rhythm.   Assessment/Plan  1. Chest pain, CAD  - Pt has hx of  CAD with stent placed to LAD in 2009; normal nuc med study in February 2017  - She presents with 2 days of intermittent chest discomfort with both typical and atypical features  - Initial workup is reassuring; troponin is undetectable and EKG is without any appreciable ischemic features  - Plan to treat with ASA 324 mg, monitor on telemetry for ischemic changes, obtain serial troponin measurements, repeat EKG, continue beta-blocker and Lovaza   2. Hypertension  - BP is at goal  - Continue Toprol, lisinopril, and Norvasc   3. Anxiety  - Pt reports some anxiety related to her chest pain - Continue Remeron, Buspar, Ativan   4.  GERD - No EGD report on file  - Managed at home with Protonix BID, will continue     DVT prophylaxis: sq Lovenox  Code Status: Full  Family Communication: Discussed with patient Disposition Plan: Observe on telemetry Consults called: None Admission status: Observation    Vianne Bulls, MD [facility] Pager [phone]  If 7PM-7AM, please contact night-coverage [email or web address] Password [email or web address]  07/04/2016, 9:00 PM

## 2016-07-05 DIAGNOSIS — R079 Chest pain, unspecified: Secondary | ICD-10-CM | POA: Diagnosis not present

## 2016-07-05 DIAGNOSIS — R072 Precordial pain: Secondary | ICD-10-CM | POA: Diagnosis not present

## 2016-07-05 LAB — CBC
HEMATOCRIT: 33.9 % — AB (ref 36.0–46.0)
HEMOGLOBIN: 10.6 g/dL — AB (ref 12.0–15.0)
MCH: 24.7 pg — AB (ref 26.0–34.0)
MCHC: 31.3 g/dL (ref 30.0–36.0)
MCV: 78.8 fL (ref 78.0–100.0)
Platelets: 209 10*3/uL (ref 150–400)
RBC: 4.3 MIL/uL (ref 3.87–5.11)
RDW: 15.9 % — ABNORMAL HIGH (ref 11.5–15.5)
WBC: 9.9 10*3/uL (ref 4.0–10.5)

## 2016-07-05 LAB — BASIC METABOLIC PANEL
ANION GAP: 5 (ref 5–15)
BUN: 14 mg/dL (ref 6–20)
CALCIUM: 8.5 mg/dL — AB (ref 8.9–10.3)
CHLORIDE: 107 mmol/L (ref 101–111)
CO2: 28 mmol/L (ref 22–32)
Creatinine, Ser: 0.71 mg/dL (ref 0.44–1.00)
GFR calc non Af Amer: >60 mL/min (ref >60)
GLUCOSE: 152 mg/dL — AB (ref 65–99)
POTASSIUM: 4.1 mmol/L (ref 3.5–5.1)
Sodium: 140 mmol/L (ref 135–145)

## 2016-07-05 LAB — TROPONIN I

## 2016-07-05 LAB — GLUCOSE, CAPILLARY
GLUCOSE-CAPILLARY: 169 mg/dL — AB (ref 65–99)
Glucose-Capillary: 132 mg/dL — ABNORMAL HIGH (ref 65–99)

## 2016-07-05 NOTE — Progress Notes (Signed)
Discharge instructions gone over with patient, verbalized understanding. IV removed without difficulty, patient tolerated procedure well.

## 2016-07-05 NOTE — Discharge Summary (Signed)
Physician Discharge Summary  Carmen [name] ZRA:[id number] DOB: 16-Apr-1940 DOA: 07/04/2016  PCP: [facility] date: 07/04/2016 Discharge date: 07/05/2016  Time spent: 45 minutes  Recommendations for Outpatient Follow-up:  -Will be discharged home today. -Advised to follow up with cardiology if symptoms recur.   Discharge Diagnoses:  Principal Problem:   Chest pain Active Problems:   GERD (gastroesophageal reflux disease)   CAD (coronary artery disease)   HTN (hypertension)   Anxiety   Discharge Condition: Stable and improved  Filed Weights   07/04/16 1750 07/04/16 2133  Weight: 72.6 kg (160 lb) 72.6 kg (160 lb)    History of present illness:  As per Dr. Myna Hidalgo on 5/5: Carmen [name] is a 76 y.o. female with medical history significant for hypertension, hyperlipidemia, type 2 diabetes mellitus, anxiety, GERD, and coronary artery disease with stent to LAD who presents to the emergency department with chest pain. Patient reports that she had been in her usual state of health until yesterday when she developed an acute discomfort under the left breast described as a "grabbing," or "tightness," lasting 5-10 minutes before resolving spontaneously, associated with mild nausea, but no diaphoresis or significant dyspnea. Patient went dancing last night with her boyfriend, did not dance quite as long as she usually does secondary to increased fatigue, but did not experience any dyspnea or chest discomfort with that exertion. After returning home, however, she noted a recurrence in the chest discomfort, again resolving spontaneously after 5-10 minutes. She reports taking 2 baby aspirin with each of the 2 episodes yesterday. She woke in her usual state today and was having an uneventful day until she experienced a recurrence in her symptoms this evening, described as longer lasting and more intense, but with the same character as the symptoms experienced  yesterday. She had never experienced similar discomfort previously, noting that just prior to her stent placed in 2009, she experienced an intense central chest pressure unlike her current symptoms.  ED Course: Upon arrival to the ED, patient is found to be afebrile, saturating well on room air, and with vital signs stable. EKG features a normal sinus rhythm and chest x-ray is notable for cardiomegaly without acute cardiopulmonary disease. Chemistry panels unremarkable, CBC is notable for a mild leukocytosis to 11,700 and a mild normocytic anemia with hemoglobin of 11.0. Troponin is undetectable. Patient remained hemodynamically stable in the emergency department, in no apparent respiratory distress, and free of any anginal complaints. She will be observed on the telemetry unit for ongoing evaluation and management of chest pain, now resolved, with history of CAD and concern for possible ACS.   Hospital Course:   Chest Pain, CAD -Chest pain has resolved. -This started after she went to a senior dance, is very localized to an area under her left breast (?wonder if could be muscle spasm). -Has ruled out for ACS with negative troponins and EKG without acute abnormalities. -No further work up at present  Procedures:  None   Consultations:  None  Discharge Instructions  Discharge Instructions    Diet - low sodium heart healthy    Complete by:  As directed    Increase activity slowly    Complete by:  As directed      Allergies as of 07/05/2016      Reactions   Sulfa Antibiotics Nausea And Vomiting      Medication List    STOP taking these medications   cephALEXin 250 MG capsule  Commonly known as:  KEFLEX     TAKE these medications   amLODipine 10 MG tablet Commonly known as:  NORVASC Take 10 mg by mouth daily.   aspirin 81 MG chewable tablet Chew 81 mg by mouth daily.   busPIRone 15 MG tablet Commonly known as:  BUSPAR Take 15 mg by mouth 3 (three) times daily. Take 1/2  tablet daily   fish oil-omega-3 fatty acids 1000 MG capsule Take 1 g by mouth 2 (two) times daily.   glipiZIDE 5 MG tablet Commonly known as:  GLUCOTROL Take 5 mg by mouth 2 (two) times daily before a meal.   lisinopril 10 MG tablet Commonly known as:  PRINIVIL,ZESTRIL Take 10 mg by mouth daily.   LORazepam 0.5 MG tablet Commonly known as:  ATIVAN Take 0.5 mg by mouth 2 (two) times daily.   metFORMIN 500 MG tablet Commonly known as:  GLUCOPHAGE Take 500 mg by mouth 2 (two) times daily with a meal.   metoprolol succinate 50 MG 24 hr tablet Commonly known as:  TOPROL-XL Take 50 mg by mouth daily. Take with or immediately following a meal.   mirtazapine 45 MG tablet Commonly known as:  REMERON Take 45 mg by mouth at bedtime.   multivitamin with minerals Tabs tablet Take 1 tablet by mouth daily.   nitroGLYCERIN 0.4 MG SL tablet Commonly known as:  NITROSTAT Place 1 tablet (0.4 mg total) under the tongue every 5 (five) minutes as needed for chest pain.   pantoprazole 40 MG tablet Commonly known as:  PROTONIX TAKE 1 TABLET BY MOUTH TWICE DAILY. TAKE 30 MINS PRIOR TO MEALS      Allergies  Allergen Reactions  . Sulfa Antibiotics Nausea And Vomiting   Follow-up Information    [facility] Schedule an appointment as soon as possible for a visit in 2 week(s).   Contact information: [address] [place] [address] [phone]            The results of significant diagnostics from this hospitalization (including imaging, microbiology, ancillary and laboratory) are listed below for reference.    Significant Diagnostic Studies: Dg Chest 2 View  Result Date: 07/04/2016 CLINICAL DATA:  76 year old female with left chest pain for 1 day. Initial encounter. EXAM: CHEST  2 VIEW COMPARISON:  03/08/2012 radiographs FINDINGS: Cardiomegaly again noted. There is no evidence of focal airspace disease, pulmonary edema, suspicious pulmonary nodule/mass,  pleural effusion, or pneumothorax. No acute bony abnormalities are identified. IMPRESSION: Cardiomegaly without evidence of acute cardiopulmonary disease. Electronically Signed   By: Margarette Canada M.D.   On: 07/04/2016 19:01    Microbiology: No results found for this or any previous visit (from the past 240 hour(s)).   Labs: Basic Metabolic Panel:  Recent Labs Lab 07/04/16 1852 07/05/16 0449  NA 141 140  K 3.7 4.1  CL 107 107  CO2 26 28  GLUCOSE 181* 152*  BUN 15 14  CREATININE 0.82 0.71  CALCIUM 8.6* 8.5*   Liver Function Tests: No results for input(s): AST, ALT, ALKPHOS, BILITOT, PROT, ALBUMIN in the last 168 hours. No results for input(s): LIPASE, AMYLASE in the last 168 hours. No results for input(s): AMMONIA in the last 168 hours. CBC:  Recent Labs Lab 07/04/16 1852 07/05/16 0449  WBC 11.7* 9.9  HGB 11.0* 10.6*  HCT 36.0 33.9*  MCV 79.6 78.8  PLT 227 209   Cardiac Enzymes:  Recent Labs Lab 07/04/16 1852 07/04/16 2207 07/05/16 0449 07/05/16 0349  TROPONINI <0.03 <0.03 <0.03 <0.03   BNP: BNP (last 3 results) No results for input(s): BNP in the last 8760 hours.  ProBNP (last 3 results) No results for input(s): PROBNP in the last 8760 hours.  CBG:  Recent Labs Lab 07/05/16 0740 07/05/16 1126  GLUCAP 132* 169*       Signed:  HERNANDEZ ACOSTA,ESTELA  [facility] Pager: [phone] 07/05/2016, 11:44 AM

## 2016-07-06 LAB — HEMOGLOBIN A1C
HEMOGLOBIN A1C: 7.2 % — AB (ref 4.8–5.6)
Mean Plasma Glucose: 160 mg/dL

## 2016-07-20 ENCOUNTER — Emergency Department (HOSPITAL_COMMUNITY)
Admission: EM | Admit: 2016-07-20 | Discharge: 2016-07-20 | Disposition: A | Discharge status: Home / Self Care | Payer: Medicare Other

## 2016-07-20 NOTE — ED Notes (Signed)
Pt states she was under the impression that she was going to get a ct scan and not be seen by ed. Advised pt of protocol also. Pt states she is not going to stay and will call her pcp to see if they can just order the test and not come through ED. Pt in nad, nondiaphoretic. Advised of risks if leaves.

## 2016-08-25 ENCOUNTER — Other Ambulatory Visit (HOSPITAL_COMMUNITY): Payer: Self-pay | Admitting: Family

## 2016-08-25 DIAGNOSIS — R0789 Other chest pain: Secondary | ICD-10-CM

## 2016-08-27 ENCOUNTER — Ambulatory Visit (HOSPITAL_COMMUNITY)
Admission: RE | Admit: 2016-08-27 | Discharge: 2016-08-27 | Disposition: A | Discharge status: Home / Self Care | Payer: Medicare Other | Source: Ambulatory Visit | Attending: Family | Admitting: Family

## 2016-08-27 ENCOUNTER — Encounter (HOSPITAL_COMMUNITY): Payer: Self-pay

## 2016-08-27 DIAGNOSIS — R0789 Other chest pain: Secondary | ICD-10-CM

## 2016-08-27 DIAGNOSIS — I700 Atherosclerosis of aorta: Secondary | ICD-10-CM | POA: Diagnosis not present

## 2016-08-27 LAB — POCT I-STAT CREATININE: Creatinine, Ser: 0.7 mg/dL (ref 0.44–1.00)

## 2016-08-27 MED ORDER — IOPAMIDOL (ISOVUE-370) INJECTION 76%
100.0000 mL | Freq: Once | INTRAVENOUS | Status: AC | PRN
  Administered 2016-08-27: 100 mL via INTRAVENOUS

## 2017-05-03 ENCOUNTER — Other Ambulatory Visit: Payer: Self-pay

## 2017-05-03 ENCOUNTER — Encounter: Payer: Self-pay | Admitting: Cardiovascular Disease

## 2017-05-03 ENCOUNTER — Ambulatory Visit (INDEPENDENT_AMBULATORY_CARE_PROVIDER_SITE_OTHER): Payer: Medicare Other | Admitting: Cardiovascular Disease

## 2017-05-03 VITALS — BP 144/80 | HR 81 | Ht 66.0 in | Wt 157.6 lb

## 2017-05-03 DIAGNOSIS — E7800 Pure hypercholesterolemia, unspecified: Secondary | ICD-10-CM

## 2017-05-03 DIAGNOSIS — Z955 Presence of coronary angioplasty implant and graft: Secondary | ICD-10-CM | POA: Diagnosis not present

## 2017-05-03 DIAGNOSIS — I25118 Atherosclerotic heart disease of native coronary artery with other forms of angina pectoris: Secondary | ICD-10-CM

## 2017-05-03 DIAGNOSIS — I10 Essential (primary) hypertension: Secondary | ICD-10-CM

## 2017-05-03 DIAGNOSIS — I517 Cardiomegaly: Secondary | ICD-10-CM | POA: Diagnosis not present

## 2017-05-03 NOTE — Progress Notes (Signed)
SUBJECTIVE:  The patient presents for routine follow-up of coronary artery disease with LAD stents. Echocardiogram on 11/20/13 demonstrated normal left ventricular systolic function, EF 21-97%, grade 1 diastolic dysfunction, and severe left atrial enlargement. She has preferred to avoid statin therapy. She also has diabetes.  Nuclear myocardial perfusion study 04/22/15 was normal, LVEF 64%.  Prior to stenting of her LAD, she felt fatigued and described "an explosion sensation in my chest ".  She has not had any similar symptoms since that time.  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  The patient denies any symptoms of chest pain, palpitations, shortness of breath, lightheadedness, dizziness, leg swelling, orthopnea, PND, and syncope.  She said she has never smoked.  She drinks 1 beer only when having pizza.  She and her husband divorced because he was an alcoholic.    Review of Systems: As per "subjective", otherwise negative.  Allergies  Allergen Reactions  . Sulfa Antibiotics Nausea And Vomiting    Current Outpatient Medications  Medication Sig Dispense Refill  . amLODipine (NORVASC) 10 MG tablet Take 10 mg by mouth daily.    Marland Kitchen aspirin 81 MG chewable tablet Chew 81 mg by mouth daily.    Marland Kitchen atorvastatin (LIPITOR) 40 MG tablet Take 1 tablet by mouth daily.    . busPIRone (BUSPAR) 15 MG tablet Take 15 mg by mouth 3 (three) times daily. Take 1/2 tablet daily     . fish oil-omega-3 fatty acids 1000 MG capsule Take 1 g by mouth 2 (two) times daily.     Marland Kitchen glipiZIDE (GLUCOTROL) 5 MG tablet Take 5 mg by mouth 2 (two) times daily before a meal.    . lisinopril (PRINIVIL,ZESTRIL) 10 MG tablet Take 10 mg by mouth daily.    Marland Kitchen LORazepam (ATIVAN) 0.5 MG tablet Take 0.5 mg by mouth 2 (two) times daily.    . metFORMIN (GLUCOPHAGE) 500 MG tablet Take 500 mg by mouth 2 (two) times daily with a meal.     . metoprolol succinate (TOPROL-XL) 50 MG 24 hr tablet Take 50 mg by mouth daily. Take with or immediately following a meal.    . mirtazapine (REMERON) 45 MG tablet Take 45 mg by mouth at bedtime.    . Multiple Vitamin (MULTIVITAMIN WITH MINERALS) TABS tablet Take 1 tablet by mouth daily.    . nitroGLYCERIN (NITROSTAT) 0.4 MG SL tablet Place 1 tablet (0.4 mg total) under the tongue every 5 (five) minutes as needed for chest pain. 25 tablet 3  . pantoprazole (PROTONIX) 40 MG tablet TAKE 1 TABLET BY MOUTH TWICE DAILY. TAKE 30 MINS PRIOR TO MEALS 60 tablet 5   No current facility-administered medications for this visit.     Past Medical History:  Diagnosis Date  . Abnormal LFTs   . CAD (coronary artery disease)   . Diabetes ([place])    diet controlled  . GERD (gastroesophageal reflux disease)    Esophagus dilated in the 1990s per patient  . Hyperlipidemia   . Hypertension     Past Surgical History:  Procedure Laterality Date  . ABDOMINAL HYSTERECTOMY    . APPENDECTOMY    . CARDIAC CATHETERIZATION  2009   with stent  . CATARACT EXTRACTION     left  . COLONOSCOPY N/A 04/03/2015   RMR:4 polyps/mild diverticulosis/small internal hemorrhoids  . COLONOSCOPY WITH ESOPHAGOGASTRODUODENOSCOPY (EGD)  02/15/2012   JOI:TGPQD sessile polyps  3-8 mm found at the cecum/ 8 polyps measuring 3-8 mm in/1 polyp measuring 6 mm in transverse colon/3-75m polyps found in the sigmoid/6 mm polyp found in the rectum/small internal hemorrhoids  . ESOPHAGEAL DILATION     In the 1990s  . 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 ESOPHAGOGASTRODUODENOSCOPY N/A 04/03/2015   RVVZ:SMOLMBEMLat the gastroesophageal junction/moderate nodular gastritis  . SAVORY DILATION N/A 04/03/2015   Procedure: SAVORY DILATION;  Surgeon: SDanie Binder MD;  Location: [facility];   Service: Endoscopy;  Laterality: N/A;    Social History   Socioeconomic History  . Marital status: Divorced    Spouse name: Not on file  . Number of children: 3  . Years of education: Not on file  . Highest education level: Not on file  Social Needs  . Financial resource strain: Not on file  . Food insecurity - worry: Not on file  . Food insecurity - inability: Not on file  . Transportation needs - medical: Not on file  . Transportation needs - non-medical: Not on file  Occupational History  . Not on file  Tobacco Use  . Smoking status: Never Smoker  . Smokeless tobacco: Never Used  Substance and Sexual Activity  . Alcohol use: No    Alcohol/week: 0.0 oz  . Drug use: No  . Sexual activity: Yes    Birth control/protection: Surgical  Other Topics Concern  . Not on file  Social History Narrative  . Not on file     Vitals:   05/03/17 0949  BP: (!) 144/80  Pulse: 81  SpO2: 93%  Weight: 157 lb 9.6 oz (71.5 kg)  Height: 5' 6"  (1.676 m)    Wt Readings from Last 3 Encounters:  05/03/17 157 lb 9.6 oz (71.5 kg)  07/04/16 160 lb (72.6 kg)  04/30/16 148 lb (67.1 kg)     PHYSICAL EXAM General: NAD HEENT: Normal. Neck: No JVD, no thyromegaly. Lungs: Clear to auscultation bilaterally with normal respiratory effort. CV: Regular rate and rhythm, normal S1/S2, no S3/S4, no murmur. No pretibial or periankle edema.  No carotid bruit.   Abdomen: Soft, nontender, no distention.  Neurologic: Alert and oriented.  Psych: Normal affect. Skin: Normal. Musculoskeletal: No gross deformities.    ECG: Most recent ECG reviewed.   Labs: Lab Results  Component Value Date/Time   K 4.1 07/05/2016 04:49 AM   K 4.5 11/05/2011 08:54 AM   BUN 14 07/05/2016 04:49 AM   BUN 12 11/05/2011 08:54 AM   CREATININE 0.70 08/27/2016 10:57 AM   CREATININE 1.03 08/10/2012 04:20 PM   ALT 94 (H) 08/10/2012 04:20 PM   TSH 0.429 03/08/2012 10:12 AM   HGB 10.6 (L) 07/05/2016 04:49 AM      Lipids: No results found for: LDLCALC, LDLDIRECT, CHOL, TRIG, HDL     ASSESSMENT AND PLAN: 1. CAD with LAD stents: Symptomatically stable. Normal stress test 04/2015. Currently on ASA, metoprolol, and is now on Lipitor 40 mg.  2. Essential HTN: Mildly elevated today, normal at last visit.  She checks her blood pressure at home and systolic readings range from 112-128.  I will monitor. No changes to therapy.  3. Hyperlipidemia: Currently on Lipitor 40 mg.  I will obtain a copy of lipids from her PCP.  4. Severe left atrial enlargement with palpitations: No significant symptoms at present. At risk for development  of atrial fibrillation given history of CAD and hypertension, along with age and gender. Will monitor.      Disposition: Follow up 1 year   Kate Sable, M.D., F.A.C.C.

## 2017-05-03 NOTE — Patient Instructions (Signed)
Your physician wants you to follow-up in:  1 year with Dr Virgina Jock will receive a reminder letter in the mail two months in advance. If you don't receive a letter, please call our office to schedule the follow-up appointment.    Your physician recommends that you continue on your current medications as directed. Please refer to the Current Medication list given to you today.    If you need a refill on your cardiac medications before your next appointment, please call your pharmacy.    No lab work or testing ordered today       Thank you for choosing [facility] !

## 2017-08-06 ENCOUNTER — Encounter (HOSPITAL_COMMUNITY): Payer: Self-pay | Admitting: Internal Medicine

## 2017-08-06 ENCOUNTER — Inpatient Hospital Stay (HOSPITAL_COMMUNITY): Payer: Medicare Other

## 2017-08-06 ENCOUNTER — Other Ambulatory Visit: Payer: Self-pay

## 2017-08-06 ENCOUNTER — Ambulatory Visit (HOSPITAL_COMMUNITY): Payer: Medicare Other | Admitting: Hematology

## 2017-08-06 ENCOUNTER — Inpatient Hospital Stay (HOSPITAL_COMMUNITY): Payer: Medicare Other | Attending: Internal Medicine | Admitting: Internal Medicine

## 2017-08-06 VITALS — BP 142/61 | HR 79 | Temp 98.2°F | Resp 16 | Wt 160.6 lb

## 2017-08-06 DIAGNOSIS — I10 Essential (primary) hypertension: Secondary | ICD-10-CM

## 2017-08-06 DIAGNOSIS — D72828 Other elevated white blood cell count: Secondary | ICD-10-CM

## 2017-08-06 DIAGNOSIS — D72829 Elevated white blood cell count, unspecified: Secondary | ICD-10-CM | POA: Insufficient documentation

## 2017-08-06 DIAGNOSIS — M25559 Pain in unspecified hip: Secondary | ICD-10-CM

## 2017-08-06 DIAGNOSIS — M1990 Unspecified osteoarthritis, unspecified site: Secondary | ICD-10-CM | POA: Diagnosis not present

## 2017-08-06 NOTE — Progress Notes (Signed)
Referring physician:[facility]  CC:  CFM  Reason for consultation leukocytosis  Diagnosis Other elevated white blood cell (WBC) count - Plan: CBC with Differential/Platelet, Comprehensive metabolic panel, Lactate dehydrogenase, Sedimentation rate, C-reactive protein, Rheumatoid factor, BCR-ABL1, CML/ALL, PCR, QUANT, JAK2 genotypr  Staging Cancer Staging No matching staging information was found for the patient.  Assessment and Plan:  1.  Leukocytosis.  77 year old Carmen [name] who reports she was told in the past she had an elevated white count.  She has undergone injections for arthritis in January 2019 as well as April 2019.  She has never smoked.  She denies any fevers, chills, night sweats, and has noted no adenopathy.  Labs there were done 07/13/2017 showed a white count of 18.5 hemoglobin Carmen.1 hematocrit 35.3 MCV was 77 platelets were 307,000.  Labs done 02/25/2017 showed a white count 14.6 hemoglobin Carmen.4 platelets 256,000.  Labs that were done March 16, 2017 showed a white count 12.6 hemoglobin Carmen.3 platelets 236,000.  Patient denies any significant complaints today.  She reports 2 months ago she was treated with sinus medications.  Previous labs that were done 07/05/2016 showed a white count 9.9 hemoglobin 10.6 platelets 209,000. Chemistries within normal limits.  Patient is seen today for consultation due to leukocytosis.  Long talk held with the patient.  I discussed with her that review of chart indicates she has had a mildly elevated white count dating back to 2018.  Differential normal, with mild elevation in lymphocytes.  She will undergo lab evaluation with CBC, chemistries, LDH, flow cytometry, BCR/ABL, Jak 2.  Patient will return to clinic in 7-10 days to go over the results.  She is advised to notify the office if she has any problems prior to her next visit.  She reports she remains asymptomatic other than joint pain.  2.  Joint pain.  Will check sed rate, C-reactive  protein, rheumatoid factor, SPEP.  Pt will RTC to go over results.    3.  HTN.  BP is 142/61.  Follow-up with PCP.   4.  Sinus symptoms.  She reports she has been started on sinus medication.   HPI: 77 year old Carmen [name] who reports she was told in the past she had an elevated white count.  She has undergone injections for arthritis in January 2019 as well as April 2019.  She has never smoked.  She denies any fevers, chills, night sweats, and has noted no adenopathy.  Labs there were done 07/13/2017 showed a white count of 18.5 hemoglobin Carmen.1 hematocrit 35.3 MCV was 77 platelets were 307,000.  Labs done 02/25/2017 showed a white count 14.6 hemoglobin Carmen.4 platelets 256,000.  Labs that were done March 16, 2017 showed a white count 12.6 hemoglobin Carmen.3 platelets 236,000.  Patient denies any significant complaints today.  She reports 2 months ago she was treated with sinus medications.  Previous labs that were done 07/05/2016 showed a white count 9.9 hemoglobin 10.6 platelets 209,000. Chemistries within normal limits.  Patient is seen today for consultation due to leukocytosis.   Problem List Patient Active Problem List   Diagnosis Date Noted  . Chest pain [R07.9] 07/04/2016  . Anxiety [F41.9] 07/04/2016  . Dysphagia [R13.10]   . Dysphagia, idiopathic [R13.10] 03/20/2015  . CAD (coronary artery disease) [I25.10] 11/17/2013  . HTN (hypertension) [I10] 11/17/2013  . Nonalcoholic steatohepatitis (NASH) [K75.81] Carmen/20/2013  . GERD (gastroesophageal reflux disease) [K21.9] Carmen/20/2013  . History of colonic polyps [Z86.010] Carmen/20/2013    Past Medical History Past Medical History:  Diagnosis  Date  . Abnormal LFTs   . CAD (coronary artery disease)   . Diabetes ([place])    diet controlled  . GERD (gastroesophageal reflux disease)    Esophagus dilated in the 1990s per patient  . Hyperlipidemia   . Hypertension     Past Surgical History Past Surgical History:  Procedure Laterality Date  . ABDOMINAL  HYSTERECTOMY    . APPENDECTOMY    . CARDIAC CATHETERIZATION  2009   with stent  . CATARACT EXTRACTION     left  . COLONOSCOPY N/A 04/03/2015   RMR:4 polyps/mild diverticulosis/small internal hemorrhoids  . COLONOSCOPY WITH ESOPHAGOGASTRODUODENOSCOPY (EGD)  02/15/2012   URK:YHCWC sessile polyps 3-8 mm found at the cecum/ 8 polyps measuring 3-8 mm in/1 polyp measuring 6 mm in transverse colon/3-69m polyps found in the sigmoid/6 mm polyp found in the rectum/small internal hemorrhoids  . ESOPHAGEAL DILATION     In the 1990s  . 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 ESOPHAGOGASTRODUODENOSCOPY N/A 04/03/2015   RBJS:EGBTDVVOHat the gastroesophageal junction/moderate nodular gastritis  . SAVORY DILATION N/A 04/03/2015   Procedure: SAVORY DILATION;  Surgeon: SDanie Binder MD;  Location: [facility];  Service: Endoscopy;  Laterality: N/A;    Family History Family History  Problem Relation Age of Onset  . Heart attack Father   . Kidney disease Mother   . Diabetes Brother   . Hypertension Brother   . Stomach cancer Maternal Uncle   . Throat cancer Paternal Uncle   . Diabetes Brother   . Hypertension Brother   . Prostate cancer Maternal Uncle   . Brain cancer Maternal Uncle   . Colon cancer Neg Hx   . Liver disease Neg Hx      Social History  reports that she has never smoked. She has never used smokeless tobacco. She reports that she does not drink alcohol or use drugs.  Medications  Current Outpatient Medications:  .  amLODipine (NORVASC) 10 MG tablet, Take 10 mg by mouth daily., Disp: , Rfl:  .  aspirin 81 MG chewable tablet, Chew 81 mg by mouth daily., Disp: , Rfl:  .  atorvastatin (LIPITOR) 40 MG tablet, Take 1 tablet by mouth daily., Disp: , Rfl:  .  busPIRone (BUSPAR) 15 MG tablet, Take 15 mg by mouth 3 (three) times daily.  Take 1/2 tablet daily , Disp: , Rfl:  .  fish oil-omega-3 fatty acids 1000 MG capsule, Take 1 g by mouth 2 (two) times daily. , Disp: , Rfl:  .  glipiZIDE (GLUCOTROL) 5 MG tablet, Take 5 mg by mouth 2 (two) times daily before a meal., Disp: , Rfl:  .  lisinopril (PRINIVIL,ZESTRIL) 10 MG tablet, Take 10 mg by mouth daily., Disp: , Rfl:  .  LORazepam (ATIVAN) 0.5 MG tablet, Take 0.5 mg by mouth 2 (two) times daily., Disp: , Rfl:  .  metFORMIN (GLUCOPHAGE) 500 MG tablet, Take 500 mg by mouth 2 (two) times daily with a meal., Disp: , Rfl:  .  metoprolol succinate (TOPROL-XL) 50 MG 24 hr tablet, Take 50 mg by mouth daily. Take with or immediately following a meal., Disp: , Rfl:  .  mirtazapine (REMERON) 45 MG tablet, Take 45 mg by mouth at bedtime., Disp: , Rfl:  .  Multiple Vitamin (MULTIVITAMIN WITH MINERALS) TABS tablet, Take 1 tablet by mouth daily., Disp: , Rfl:  .  nitroGLYCERIN (NITROSTAT) 0.4 MG SL tablet, Place 1 tablet (0.4 mg total) under the tongue every 5 (five) minutes as needed for chest pain., Disp: 25 tablet, Rfl: 3 .  pantoprazole (PROTONIX) 40 MG tablet, TAKE 1 TABLET BY MOUTH TWICE DAILY. TAKE 30 MINS PRIOR TO MEALS, Disp: 60 tablet, Rfl: 5  Allergies Sulfa antibiotics  Review of Systems Review of Systems - Oncology ROS as per HPI otherwise 12 point ROS is negative.   Physical Exam  Vitals Wt Readings from Last 3 Encounters:  08/06/17 160 lb 9.6 oz (72.8 kg)  05/03/17 157 lb 9.6 oz (71.5 kg)  07/04/16 160 lb (72.6 kg)   Temp Readings from Last 3 Encounters:  08/06/17 98.2 F (36.8 C) (Oral)  07/05/16 98.4 F (36.9 C) (Oral)  04/03/15 98.2 F (36.8 C) (Oral)   BP Readings from Last 3 Encounters:  08/06/17 (!) 142/61  05/03/17 (!) 144/80  07/05/16 (!) 135/56   Pulse Readings from Last 3 Encounters:  08/06/17 79  05/03/17 81  07/05/16 65   Constitutional: Well-developed, well-nourished, and in no distress.   HENT: Head: Normocephalic and atraumatic.   Mouth/Throat: No oropharyngeal exudate. Mucosa moist. Eyes: Pupils are equal, round, and reactive to light. Conjunctivae are normal. No scleral icterus.  Neck: Normal range of motion. Neck supple. No JVD present.  Cardiovascular: Normal rate, regular rhythm and normal heart sounds.  Exam reveals no gallop and no friction rub.   No murmur heard. Pulmonary/Chest: Effort normal and breath sounds normal. No respiratory distress. No wheezes.No rales.  Abdominal: Soft. Bowel sounds are normal. No distension. There is no tenderness. There is no guarding.  Musculoskeletal: No edema or tenderness.  Lymphadenopathy: No cervical, axillary or supraclavicular adenopathy.  Neurological: Alert and oriented to person, place, and time. No cranial nerve deficit.  Skin: Skin is warm and dry. No rash noted. No erythema. No pallor.  Psychiatric: Affect and judgment normal.   Labs No visits with results within 3 Day(s) from this visit.  Latest known visit with results is:  Hospital Outpatient Visit on 08/27/2016  Component Date Value Ref Range Status  . Creatinine, Ser 08/27/2016 0.70  0.44 - 1.00 mg/dL Final     Pathology Orders Placed This Encounter  Procedures  . CBC with Differential/Platelet    Standing Status:   Future    Standing Expiration Date:   08/07/2018  . Comprehensive metabolic panel    Standing Status:   Future    Standing Expiration Date:   08/07/2018  . Lactate dehydrogenase    Standing Status:   Future    Standing Expiration Date:   08/07/2018  . Sedimentation rate    Standing Status:   Future    Standing Expiration Date:   08/06/2018  . C-reactive protein    Standing Status:   Future    Standing Expiration Date:   08/06/2018  . Rheumatoid factor    Standing Status:   Future    Standing Expiration Date:   08/06/2018  . BCR-ABL1, CML/ALL, PCR, QUANT  . JAK2 genotypr    Standing Status:   Future    Standing Expiration Date:   08/06/2018       Zoila Shutter MD

## 2017-08-11 LAB — BCR-ABL1, CML/ALL, PCR, QUANT

## 2017-08-17 ENCOUNTER — Ambulatory Visit (HOSPITAL_COMMUNITY): Payer: Medicare Other | Admitting: Internal Medicine

## 2017-08-18 ENCOUNTER — Inpatient Hospital Stay (HOSPITAL_COMMUNITY): Payer: Medicare Other

## 2017-08-18 ENCOUNTER — Ambulatory Visit (HOSPITAL_COMMUNITY): Payer: Medicare Other | Admitting: Internal Medicine

## 2017-08-18 DIAGNOSIS — D72828 Other elevated white blood cell count: Secondary | ICD-10-CM

## 2017-08-18 DIAGNOSIS — M25559 Pain in unspecified hip: Secondary | ICD-10-CM

## 2017-08-18 DIAGNOSIS — D72829 Elevated white blood cell count, unspecified: Secondary | ICD-10-CM | POA: Diagnosis not present

## 2017-08-18 LAB — CBC WITH DIFFERENTIAL/PLATELET
BASOS ABS: 0 10*3/uL (ref 0.0–0.1)
BASOS PCT: 0 %
EOS ABS: 0.1 10*3/uL (ref 0.0–0.7)
Eosinophils Relative: 1 %
HEMATOCRIT: 37.1 % (ref 36.0–46.0)
Hemoglobin: 11 g/dL — ABNORMAL LOW (ref 12.0–15.0)
Lymphocytes Relative: 40 %
Lymphs Abs: 4.5 10*3/uL — ABNORMAL HIGH (ref 0.7–4.0)
MCH: 24.3 pg — ABNORMAL LOW (ref 26.0–34.0)
MCHC: 29.6 g/dL — ABNORMAL LOW (ref 30.0–36.0)
MCV: 82.1 fL (ref 78.0–100.0)
MONO ABS: 0.5 10*3/uL (ref 0.1–1.0)
Monocytes Relative: 4 %
NEUTROS PCT: 55 %
Neutro Abs: 6.1 10*3/uL (ref 1.7–7.7)
Platelets: 224 10*3/uL (ref 150–400)
RBC: 4.52 MIL/uL (ref 3.87–5.11)
RDW: 16.5 % — AB (ref 11.5–15.5)
WBC: 11.2 10*3/uL — ABNORMAL HIGH (ref 4.0–10.5)

## 2017-08-18 LAB — COMPREHENSIVE METABOLIC PANEL
ALBUMIN: 3.9 g/dL (ref 3.5–5.0)
ALT: 18 U/L (ref 14–54)
ANION GAP: 8 (ref 5–15)
AST: 15 U/L (ref 15–41)
Alkaline Phosphatase: 68 U/L (ref 38–126)
BILIRUBIN TOTAL: 0.7 mg/dL (ref 0.3–1.2)
BUN: 18 mg/dL (ref 6–20)
CO2: 26 mmol/L (ref 22–32)
Calcium: 8.7 mg/dL — ABNORMAL LOW (ref 8.9–10.3)
Chloride: 107 mmol/L (ref 101–111)
Creatinine, Ser: 0.94 mg/dL (ref 0.44–1.00)
GFR calc Af Amer: >60 mL/min (ref >60)
GFR calc non Af Amer: 57 mL/min — ABNORMAL LOW (ref >60)
GLUCOSE: 225 mg/dL — AB (ref 65–99)
POTASSIUM: 4.3 mmol/L (ref 3.5–5.1)
SODIUM: 141 mmol/L (ref 135–145)
TOTAL PROTEIN: 6.8 g/dL (ref 6.5–8.1)

## 2017-08-18 LAB — LACTATE DEHYDROGENASE: LDH: 143 U/L (ref 98–192)

## 2017-08-18 LAB — SEDIMENTATION RATE: Sed Rate: 10 mm/hr (ref 0–22)

## 2017-08-18 LAB — C-REACTIVE PROTEIN: CRP: <0.8 mg/dL (ref <1.0)

## 2017-08-19 LAB — PROTEIN ELECTROPHORESIS, SERUM
A/G RATIO SPE: 1.4 (ref 0.7–1.7)
ALPHA-2-GLOBULIN: 0.9 g/dL (ref 0.4–1.0)
Albumin ELP: 3.6 g/dL (ref 2.9–4.4)
Alpha-1-Globulin: 0.2 g/dL (ref 0.0–0.4)
BETA GLOBULIN: 1 g/dL (ref 0.7–1.3)
GLOBULIN, TOTAL: 2.6 g/dL (ref 2.2–3.9)
Gamma Globulin: 0.6 g/dL (ref 0.4–1.8)
Total Protein ELP: 6.2 g/dL (ref 6.0–8.5)

## 2017-08-19 LAB — RHEUMATOID FACTOR: Rhuematoid fact SerPl-aCnc: 10.8 IU/mL (ref 0.0–13.9)

## 2017-08-23 LAB — JAK2 GENOTYPR

## 2017-09-09 ENCOUNTER — Encounter (HOSPITAL_COMMUNITY): Payer: Self-pay | Admitting: Internal Medicine

## 2017-09-09 ENCOUNTER — Inpatient Hospital Stay (HOSPITAL_COMMUNITY): Payer: Medicare Other | Attending: Internal Medicine | Admitting: Internal Medicine

## 2017-09-09 VITALS — BP 136/56 | HR 77 | Temp 97.6°F | Resp 16 | Wt 159.6 lb

## 2017-09-09 DIAGNOSIS — M1990 Unspecified osteoarthritis, unspecified site: Secondary | ICD-10-CM | POA: Insufficient documentation

## 2017-09-09 DIAGNOSIS — E119 Type 2 diabetes mellitus without complications: Secondary | ICD-10-CM | POA: Insufficient documentation

## 2017-09-09 DIAGNOSIS — Z7982 Long term (current) use of aspirin: Secondary | ICD-10-CM | POA: Diagnosis not present

## 2017-09-09 DIAGNOSIS — K219 Gastro-esophageal reflux disease without esophagitis: Secondary | ICD-10-CM | POA: Insufficient documentation

## 2017-09-09 DIAGNOSIS — I10 Essential (primary) hypertension: Secondary | ICD-10-CM | POA: Diagnosis not present

## 2017-09-09 DIAGNOSIS — D72829 Elevated white blood cell count, unspecified: Secondary | ICD-10-CM

## 2017-09-09 DIAGNOSIS — D72820 Lymphocytosis (symptomatic): Secondary | ICD-10-CM

## 2017-09-09 DIAGNOSIS — I2510 Atherosclerotic heart disease of native coronary artery without angina pectoris: Secondary | ICD-10-CM | POA: Diagnosis not present

## 2017-09-09 DIAGNOSIS — Z7984 Long term (current) use of oral hypoglycemic drugs: Secondary | ICD-10-CM | POA: Insufficient documentation

## 2017-09-09 DIAGNOSIS — D72828 Other elevated white blood cell count: Secondary | ICD-10-CM

## 2017-09-09 DIAGNOSIS — F419 Anxiety disorder, unspecified: Secondary | ICD-10-CM | POA: Insufficient documentation

## 2017-09-09 DIAGNOSIS — E785 Hyperlipidemia, unspecified: Secondary | ICD-10-CM | POA: Insufficient documentation

## 2017-09-09 DIAGNOSIS — R1310 Dysphagia, unspecified: Secondary | ICD-10-CM | POA: Diagnosis not present

## 2017-09-09 DIAGNOSIS — Z79899 Other long term (current) drug therapy: Secondary | ICD-10-CM | POA: Insufficient documentation

## 2017-09-09 DIAGNOSIS — K7581 Nonalcoholic steatohepatitis (NASH): Secondary | ICD-10-CM | POA: Diagnosis not present

## 2017-09-09 NOTE — Patient Instructions (Signed)
[facility] Discharge Instructions  You saw Dr. Higgs today.   Thank you for choosing  [facility] Center at [facility] to provide your oncology and hematology care.  To afford each patient quality time with our provider, please arrive at least 15 minutes before your scheduled appointment time.   If you have a lab appointment with the Cancer Center please come in thru the  Main Entrance and check in at the main information desk  You need to re-schedule your appointment should you arrive 10 or more minutes late.  We strive to give you quality time with our providers, and arriving late affects you and other patients whose appointments are after yours.  Also, if you no show three or more times for appointments you may be dismissed from the clinic at the providers discretion.     Again, thank you for choosing [facility].  Our hope is that these requests will decrease the amount of time that you wait before being seen by our physicians.       _____________________________________________________________  Should you have questions after your visit to [facility], please contact our office at [phone] between the hours of 8:30 a.m. and 4:30 p.m.  Voicemails left after 4:30 p.m. will not be returned until the following business day.  For prescription refill requests, have your pharmacy contact our office.       Resources For Cancer Patients and their Caregivers ? American [employer] Society: Can assist with transportation, wigs, general needs, runs Look Good Feel Better.        1-888-227-6333 ? Cancer Care: Provides financial assistance, online support groups, medication/co-pay assistance.  1-800-813-HOPE (4673) ? [facility] Assists [place] cancer patients and their families through emotional , educational and financial support.  336-427-4357 ? [place] DSS Where to apply for food  stamps, Medicaid and utility assistance. 336-342-1394 ? [facility]: Transportation to medical appointments. 336-347-2287 ? Social Security Administration: May apply for disability if have a Stage IV cancer. 336-342-7796 1-800-772-1213 ? [place] [facility]ging, Disability and Transit Services: Assists with nutrition, care and transit needs. 336-349-2343  Cancer Center Support Programs:   > Cancer Support Group  2nd Tuesday of the month 1pm-2pm, Journey Room   > Creative Journey  3rd Tuesday of the month 1130am-1pm, Journey Room     

## 2017-09-09 NOTE — Progress Notes (Signed)
Diagnosis Other elevated white blood cell (WBC) count - Plan: CBC with Differential/Platelet, Comprehensive metabolic panel, Lactate dehydrogenase  Lymphocytosis - Plan: CBC with Differential/Platelet, Comprehensive metabolic panel, Lactate dehydrogenase  Staging Cancer Staging No matching staging information was found for the patient.  Assessment and Plan:  1.  Monoclonal B cell lymphocytosis.  77 year old female who reports she was told in the past she had an elevated white count.    Pt is here today for followup to go over labs.  Labs done 08/18/2017 reviewed with pt and daughter and showed WBC 11.2 HB 11 plts 224,000.  She had 40% lymphocytes.  SPEP negative.  BCR/ABL and Jak 2 negative.   Chemistries WNL.  Peripheral blood for flow cytometry was done 08/06/2017 with results showing - MONOCLONAL B-CELL POPULATION PRESENT - SEE COMMENT Diagnosis Comment: By flow cytometric analysis a CD5-positive lambda-restricted B-cell population comprises approximately 30% of all lymphocytes (see complete phenotype below). There is a subset of B-lymphocytes which do not appear to have light chain restriction. Immunophenotypically these findings are the same as chronic lymphocytic leukemia (CLL). However, in the absence of lymphadenopathy, organomegaly, cytopenias, or disease related symptoms, the presence of less than 5000 monoclonal B-lymphocytes per cubic millimeter is defined as monoclonal B-lymphocytosis (MBL). CLL requiring treatment develops in patients with MBL at the rate of 1.1% per year. Clinical correlation is recommended to better characterize this monoclonal B-lymphocytosis    Long talk held with pt and her daughter today.  Pt has never smoked.  Review of chart indicates she has had a mildly elevated white count dating back to 2018.  I discussed with them she does not have significant leucocytosis and has a normal HB 11 and normal plt count of 224,000.  Based on the results of flow cytometry  she has a monoclonal B cell lymphocytosis.  Risk for CLL requiring treatment occurs at a rate of 1% per year based on MBL.  She has undergone CT scans of abdomen and pelvis 03/28/2012 that showed no adenopathy.  She had CT angio of chest done 08/27/2016 that showed no adenopathy.  Pt will remain on observation and will RTC in 12/2017 for follow-up and repeat labs.  She will not be recommended for further workup unless WBC worsens or she develops anemia, thrombocytopenia or adenopathy.  Pt was provided written information regarding CLL and indications for treatment.  All questions answered and she and daughter expressed understanding of the information presented.   She is advised to notify the office if she has any problems prior to her next visit.   2.  Joint pain.  Pt has normal CRP, RF and SPEP.  Follow-up with PCP if ongoing symptoms.    3.  HTN.  BP is 136/56.  Follow-up with PCP.   4.  Urinary problems.  Pt reportedly is on chronic abx.  Follow-up with urology or PCP if ongoing symptoms.    > 35 minutes spent in counseling and coordination of care.      Interval history:  Historical data obtained from the note dated 08/06/2017:  77 year old female who reports she was told in the past she had an elevated white count.  She has undergone injections for arthritis in January 2019 as well as April 2019.  She has never smoked.  She denies any fevers, chills, night sweats, and has noted no adenopathy.  Labs there were done 07/13/2017 showed a white count of 18.5 hemoglobin 11.1 hematocrit 35.3 MCV was 77 platelets were 307,000.  Labs done 02/25/2017  showed a white count 14.6 hemoglobin 11.4 platelets 256,000.  Labs that were done March 16, 2017 showed a white count 12.6 hemoglobin 11.3 platelets 236,000.  Patient denies any significant complaints today.  She reports 2 months ago she was treated with sinus medications.  Previous labs that were done 07/05/2016 showed a white count 9.9 hemoglobin 10.6 platelets  209,000. Chemistries within normal limits.   Current Status:  Pt is seen today for follow-up.  She is accompanied by her daughter to go over lab results.  She reports some urinary problems.    Problem List Patient Active Problem List   Diagnosis Date Noted  . Chest pain [R07.9] 07/04/2016  . Anxiety [F41.9] 07/04/2016  . Dysphagia [R13.10]   . Dysphagia, idiopathic [R13.10] 03/20/2015  . CAD (coronary artery disease) [I25.10] 11/17/2013  . HTN (hypertension) [I10] 11/17/2013  . Nonalcoholic steatohepatitis (NASH) [K75.81] 01/20/2012  . GERD (gastroesophageal reflux disease) [K21.9] 01/20/2012  . History of colonic polyps [Z86.010] 01/20/2012    Past Medical History Past Medical History:  Diagnosis Date  . Abnormal LFTs   . CAD (coronary artery disease)   . Diabetes ([place])    diet controlled  . GERD (gastroesophageal reflux disease)    Esophagus dilated in the 1990s per patient  . Hyperlipidemia   . Hypertension     Past Surgical History Past Surgical History:  Procedure Laterality Date  . ABDOMINAL HYSTERECTOMY    . APPENDECTOMY    . CARDIAC CATHETERIZATION  2009   with stent  . CATARACT EXTRACTION     left  . COLONOSCOPY N/A 04/03/2015   RMR:4 polyps/mild diverticulosis/small internal hemorrhoids  . COLONOSCOPY WITH ESOPHAGOGASTRODUODENOSCOPY (EGD)  02/15/2012   VVO:HYWVP sessile polyps 3-8 mm found at the cecum/ 8 polyps measuring 3-8 mm in/1 polyp measuring 6 mm in transverse colon/3-86m polyps found in the sigmoid/6 mm polyp found in the rectum/small internal hemorrhoids  . ESOPHAGEAL DILATION     In the 1990s  . 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 ESOPHAGOGASTRODUODENOSCOPY N/A 04/03/2015   RXTG:GYIRSWNIOat the gastroesophageal junction/moderate nodular gastritis  . SAVORY DILATION N/A 04/03/2015   Procedure:  SAVORY DILATION;  Surgeon: SDanie Binder MD;  Location: [facility];  Service: Endoscopy;  Laterality: N/A;    Family History Family History  Problem Relation Age of Onset  . Heart attack Father   . Kidney disease Mother   . Diabetes Brother   . Hypertension Brother   . Stomach cancer Maternal Uncle   . Throat cancer Paternal Uncle   . Diabetes Brother   . Hypertension Brother   . Prostate cancer Maternal Uncle   . Brain cancer Maternal Uncle   . Colon cancer Neg Hx   . Liver disease Neg Hx      Social History  reports that she has never smoked. She has never used smokeless tobacco. She reports that she does not drink alcohol or use drugs.  Medications  Current Outpatient Medications:  .  amLODipine (NORVASC) 10 MG tablet, Take 10 mg by mouth daily., Disp: , Rfl:  .  aspirin 81 MG chewable tablet, Chew 81 mg by mouth daily., Disp: , Rfl:  .  atorvastatin (LIPITOR) 40 MG tablet, Take 1 tablet by mouth daily., Disp: , Rfl:  .  busPIRone (BUSPAR) 15 MG tablet, Take 15 mg by mouth 3 (three) times daily. Take 1/2 tablet  daily , Disp: , Rfl:  .  fish oil-omega-3 fatty acids 1000 MG capsule, Take 1 g by mouth 2 (two) times daily. , Disp: , Rfl:  .  glipiZIDE (GLUCOTROL) 5 MG tablet, Take 5 mg by mouth 2 (two) times daily before a meal., Disp: , Rfl:  .  lisinopril (PRINIVIL,ZESTRIL) 10 MG tablet, Take 10 mg by mouth daily., Disp: , Rfl:  .  LORazepam (ATIVAN) 0.5 MG tablet, Take 0.5 mg by mouth 2 (two) times daily., Disp: , Rfl:  .  metFORMIN (GLUCOPHAGE) 500 MG tablet, Take 500 mg by mouth 2 (two) times daily with a meal., Disp: , Rfl:  .  metoprolol succinate (TOPROL-XL) 50 MG 24 hr tablet, Take 50 mg by mouth daily. Take with or immediately following a meal., Disp: , Rfl:  .  mirtazapine (REMERON) 45 MG tablet, Take 45 mg by mouth at bedtime., Disp: , Rfl:  .  Multiple Vitamin (MULTIVITAMIN WITH MINERALS) TABS tablet, Take 1 tablet by mouth daily., Disp: , Rfl:  .  nitroGLYCERIN  (NITROSTAT) 0.4 MG SL tablet, Place 1 tablet (0.4 mg total) under the tongue every 5 (five) minutes as needed for chest pain., Disp: 25 tablet, Rfl: 3 .  pantoprazole (PROTONIX) 40 MG tablet, TAKE 1 TABLET BY MOUTH TWICE DAILY. TAKE 30 MINS PRIOR TO MEALS, Disp: 60 tablet, Rfl: 5  Allergies Sulfa antibiotics  Review of Systems Review of Systems - Oncology ROS negative other than urinary problems.     Physical Exam  Vitals Wt Readings from Last 3 Encounters:  09/09/17 159 lb 9.6 oz (72.4 kg)  08/06/17 160 lb 9.6 oz (72.8 kg)  05/03/17 157 lb 9.6 oz (71.5 kg)   Temp Readings from Last 3 Encounters:  09/09/17 97.6 F (36.4 C) (Oral)  08/06/17 98.2 F (36.8 C) (Oral)  07/05/16 98.4 F (36.9 C) (Oral)   BP Readings from Last 3 Encounters:  09/09/17 (!) 136/56  08/06/17 (!) 142/61  05/03/17 (!) 144/80   Pulse Readings from Last 3 Encounters:  09/09/17 77  08/06/17 79  05/03/17 81   Constitutional: Well-developed, well-nourished, and in no distress.   HENT: Head: Normocephalic and atraumatic.  Mouth/Throat: No oropharyngeal exudate. Mucosa moist. Eyes: Pupils are equal, round, and reactive to light. Conjunctivae are normal. No scleral icterus.  Neck: Normal range of motion. Neck supple. No JVD present.  Cardiovascular: Normal rate, regular rhythm and normal heart sounds.  Exam reveals no gallop and no friction rub.   No murmur heard. Pulmonary/Chest: Effort normal and breath sounds normal. No respiratory distress. No wheezes.No rales.  Abdominal: Soft. Bowel sounds are normal. No distension. There is no tenderness. There is no guarding.  Musculoskeletal: No edema or tenderness.  Lymphadenopathy:  No cervical, axillary or supraclavicular adenopathy.  Neurological: Alert and oriented to person, place, and time. No cranial nerve deficit.  Skin: Skin is warm and dry. No rash noted. No erythema. No pallor.  Psychiatric: Affect and judgment normal.   Labs No visits with  results within 3 Day(s) from this visit.  Latest known visit with results is:  Appointment on 08/18/2017  Component Date Value Ref Range Status  . WBC 08/18/2017 11.2* 4.0 - 10.5 K/uL Final  . RBC 08/18/2017 4.52  3.87 - 5.11 MIL/uL Final  . Hemoglobin 08/18/2017 11.0* 12.0 - 15.0 g/dL Final  . HCT 08/18/2017 37.1  36.0 - 46.0 % Final  . MCV 08/18/2017 82.1  78.0 - 100.0 fL Final  . MCH 08/18/2017 24.3* 26.0 -  34.0 pg Final  . MCHC 08/18/2017 29.6* 30.0 - 36.0 g/dL Final  . RDW 08/18/2017 16.5* 11.5 - 15.5 % Final  . Platelets 08/18/2017 224  150 - 400 K/uL Final  . Neutrophils Relative % 08/18/2017 55  % Final  . Neutro Abs 08/18/2017 6.1  1.7 - 7.7 K/uL Final  . Lymphocytes Relative 08/18/2017 40  % Final  . Lymphs Abs 08/18/2017 4.5* 0.7 - 4.0 K/uL Final  . Monocytes Relative 08/18/2017 4  % Final  . Monocytes Absolute 08/18/2017 0.5  0.1 - 1.0 K/uL Final  . Eosinophils Relative 08/18/2017 1  % Final  . Eosinophils Absolute 08/18/2017 0.1  0.0 - 0.7 K/uL Final  . Basophils Relative 08/18/2017 0  % Final  . Basophils Absolute 08/18/2017 0.0  0.0 - 0.1 K/uL Final   Performed at [facility], [address] [place], [place] [address]  . Sodium 08/18/2017 141  135 - 145 mmol/L Final  . Potassium 08/18/2017 4.3  3.5 - 5.1 mmol/L Final  . Chloride 08/18/2017 107  101 - 111 mmol/L Final  . CO2 08/18/2017 26  22 - 32 mmol/L Final  . Glucose, Bld 08/18/2017 225* 65 - 99 mg/dL Final  . BUN 08/18/2017 18  6 - 20 mg/dL Final  . Creatinine, Ser 08/18/2017 0.94  0.44 - 1.00 mg/dL Final  . Calcium 08/18/2017 8.7* 8.9 - 10.3 mg/dL Final  . Total Protein 08/18/2017 6.8  6.5 - 8.1 g/dL Final  . Albumin 08/18/2017 3.9  3.5 - 5.0 g/dL Final  . AST 08/18/2017 15  15 - 41 U/L Final  . ALT 08/18/2017 18  14 - 54 U/L Final  . Alkaline Phosphatase 08/18/2017 68  38 - 126 U/L Final  . Total Bilirubin 08/18/2017 0.7  0.3 - 1.2 mg/dL Final  . GFR calc non Af Amer 08/18/2017 57* >60 mL/min Final  . GFR  calc Af Amer 08/18/2017 >60  >60 mL/min Final   Comment: (NOTE) The eGFR has been calculated using the CKD EPI equation. This calculation has not been validated in all clinical situations. eGFR's persistently <60 mL/min signify possible Chronic Kidney Disease.   Georgiann Hahn gap 08/18/2017 8  5 - 15 Final   Performed at [facility], [address] [place], [place] [address]  . LDH 08/18/2017 143  98 - 192 U/L Final   Performed at [facility], [address], [place], [place] [address]  . Sed Rate 08/18/2017 10  0 - 22 mm/hr Final   Performed at [facility], [address], [place], [place] [address]  . CRP 08/18/2017 <0.8  <1.0 mg/dL Final   Performed at [facility] [address], [place], [place] [address]  . Rhuematoid fact SerPl-aCnc 08/18/2017 10.8  0.0 - 13.9 IU/mL Final   Comment: (NOTE) Performed At: [facility] [place], [place] [id number] Rush Farmer MD EX:[id number] Performed at [facility], [address], [place],  [address]   . JAK2 GenotypR 08/18/2017 Comment   Final   Comment: (NOTE) Result: NEGATIVE for the JAK2 V617F mutation. Interpretation:  The G to T nucleotide change encoding the V617F mutation was not detected.  This result does not rule out the presence of the JAK2 mutation at a level below the sensitivity of detection of this assay, or the presence of other mutations within JAK2 not detected by this assay.  This result does not rule out a diagnosis of polycythemia vera, essential thrombocythemia or idiopathic myelofibrosis as the  V617F mutation is not detected in all patients with these disorders.   . Director Review, JAK2 08/18/2017 Comment   Corrected   Comment: (NOTE) Constance Goltz, PhD, [facility]               Director, [place] for [place], [place]               [phone] This test was developed and its  performance characteristics determined by [facility]. It has not been cleared or approved by the Food and Drug Administration. Performed At: [facility] [address] [place], [place] [id number] Nechama Guard MD ZG:[id number] Performed At: [facility] [place], [place] [id number] Nechama Guard MD [id number]   . BACKGROUND: 08/18/2017 Comment   Corrected   Comment: (NOTE) JAK2 is a cytoplasmic tyrosine kinase with a key role in signal transduction from multiple hematopoietic growth factor receptors. A point mutation within exon 14 of the JAK2 gene ([id number]) encoding a valine to phenylalanine substitution at position 617 of the JAK2 protein (V617F) has been identified in most patients with polycythemia vera, and in about half of those with either essential thrombocythemia or idiopathic myelofibrosis. The V617F has also been detected, although infrequently, in other myeloid disorders such as chronic myelomonocytic leukemia and chronic neutrophilic luekemia. V617F is an acquired mutation that alters a highly conserved valine present in the negative regulatory JH2 domain of the JAK2 protein and is predicted to dysregulate kinase activity. Methodology: Total genomic DNA was extracted and subjected to TaqMan real-time PCR amplification/detection. Two amplification products per sample were monitored by real-time PCR using primers/probes s                          pecific to JAK2 wild type (WT) and JAK2 mutant V617F. The ABI7900 Absolute Quantitation software will compare the patient specimen valuse to the standard curves and generate percent values for wild type and mutant type. In vitro studies have indicated that this assay has an analytical sensitivity of 1%. References: Baxter EJ, Scott Phineas Real, et al. Acquired mutation of the tyrosine kinase JAK2 in human myeloproliferative disorders. Lancet. 2005 Mar 19-25; 365(9464):1054-1061. Alfonso Ramus Couedic JP. A unique clonal JAK2 mutation leading to constitutive signaling causes polycythaemia vera. Nature. 2005 Apr 28; 434(7037):1144-1148. Kralovics R, Passamonti F, Buser AS, et al. A gain-of-function mutation of JAK2 in myeloproliferative disorders. N Engl J Med. 2005 Apr 28; 352(17):1779-1790. Performed at [facility], [address], [place],  [address]   . Total Protein ELP 08/18/2017 6.2  6.0 - 8.5 g/dL Final  . Albumin ELP 08/18/2017 3.6  2.9 - 4.4 g/dL Final  . Alpha-1-Globulin 08/18/2017 0.2  0.0 - 0.4 g/dL Final  . Alpha-2-Globulin 08/18/2017 0.9  0.4 - 1.0 g/dL Final  . Beta Globulin 08/18/2017 1.0  0.7 - 1.3 g/dL Final  . Gamma Globulin 08/18/2017 0.6  0.4 - 1.8 g/dL Final  . M-Spike, % 08/18/2017 Not Observed  Not Observed g/dL Final  . SPE Interp. 08/18/2017 Comment   Final   Comment: (NOTE) The SPE pattern appears essentially unremarkable. Evidence of monoclonal protein is not apparent. Performed At:  [facility] [place], [place] [id number] Rush Farmer MD UX:[id number]   . Comment 08/18/2017 Comment   Final   Comment: (NOTE) Protein electrophoresis scan will follow via computer, mail, or courier delivery.   Marland Kitchen GLOBULIN, TOTAL 08/18/2017 2.6  2.2 - 3.9 g/dL Corrected  . A/G Ratio 08/18/2017 1.4  0.7 - 1.7 Corrected   Performed at [facility], [address] [place], [place] [address]     Pathology Orders Placed This Encounter  Procedures  . CBC with Differential/Platelet    Standing Status:   Future    Standing Expiration Date:   09/10/2018  . Comprehensive metabolic panel    Standing Status:   Future    Standing Expiration Date:   09/10/2018  . Lactate dehydrogenase    Standing Status:   Future    Standing Expiration Date:   09/10/2018       Zoila Shutter MD

## 2018-01-03 ENCOUNTER — Other Ambulatory Visit (HOSPITAL_COMMUNITY): Payer: Medicare Other

## 2018-01-10 ENCOUNTER — Ambulatory Visit (HOSPITAL_COMMUNITY): Payer: Medicare Other | Admitting: Hematology

## 2018-01-28 ENCOUNTER — Ambulatory Visit: Payer: Medicare Other | Admitting: Cardiology

## 2018-05-03 ENCOUNTER — Ambulatory Visit: Payer: Medicare Other | Admitting: Cardiovascular Disease

## 2018-10-06 ENCOUNTER — Other Ambulatory Visit: Payer: Self-pay

## 2018-10-06 ENCOUNTER — Ambulatory Visit (INDEPENDENT_AMBULATORY_CARE_PROVIDER_SITE_OTHER): Payer: Medicare Other | Admitting: Cardiovascular Disease

## 2018-10-06 ENCOUNTER — Encounter: Payer: Self-pay | Admitting: Cardiovascular Disease

## 2018-10-06 VITALS — BP 127/72 | HR 76 | Temp 95.7°F | Ht 65.0 in | Wt 162.0 lb

## 2018-10-06 DIAGNOSIS — Z955 Presence of coronary angioplasty implant and graft: Secondary | ICD-10-CM | POA: Diagnosis not present

## 2018-10-06 DIAGNOSIS — I25118 Atherosclerotic heart disease of native coronary artery with other forms of angina pectoris: Secondary | ICD-10-CM

## 2018-10-06 DIAGNOSIS — R002 Palpitations: Secondary | ICD-10-CM | POA: Diagnosis not present

## 2018-10-06 DIAGNOSIS — I517 Cardiomegaly: Secondary | ICD-10-CM | POA: Diagnosis not present

## 2018-10-06 DIAGNOSIS — I10 Essential (primary) hypertension: Secondary | ICD-10-CM | POA: Diagnosis not present

## 2018-10-06 DIAGNOSIS — E785 Hyperlipidemia, unspecified: Secondary | ICD-10-CM

## 2018-10-06 NOTE — Addendum Note (Signed)
Addended by: Barbarann Ehlers A on: 10/06/2018 01:49 PM   Modules accepted: Orders

## 2018-10-06 NOTE — Patient Instructions (Signed)
Medication Instructions: Your physician recommends that you continue on your current medications as directed. Please refer to the Current Medication list given to you today.   Labwork: None today  Procedures/Testing: Your physician has requested that you have an echocardiogram. Echocardiography is a painless test that uses sound waves to create images of your heart. It provides your doctor with information about the size and shape of your heart and how well your heart's chambers and valves are working. This procedure takes approximately one hour. There are no restrictions for this procedure.  Your physician has recommended that you wear an event monitor for 21 days. Event monitors are medical devices that record the heart's electrical activity. Doctors most often [place] these monitors to diagnose arrhythmias. Arrhythmias are problems with the speed or rhythm of the heartbeat. The monitor is a small, portable device. You can wear one while you do your normal daily activities. This is usually used to diagnose what is causing palpitations/syncope (passing out).    Follow-Up: 3 months Virtual Phone visit with Dr.Koneswaran  Any Additional Special Instructions Will Be Listed Below (If Applicable).     If you need a refill on your cardiac medications before your next appointment, please call your pharmacy.

## 2018-10-06 NOTE — Progress Notes (Signed)
SUBJECTIVE: The patient presents for routine follow-up of coronary artery disease with mid LAD 3 x 15 mm Promus DES on 09-15-07 in [place].  Echocardiogram on 11/20/13 demonstrated normal left ventricular systolic function, EF 29-79%, grade 1 diastolic dysfunction, and severe left atrial enlargement. She also has diabetes.  Nuclear myocardial perfusion study 04/22/15 was normal, LVEF 64%.  For the past 3-4 weeks she has been experiencing palpitations, once awakening her from sleep. They last for a few minutes at a time. She denies chest pain and exertional dyspnea. She also denies leg swelling, orthopnea, and PND.  She apparently had blood work done recently by her PCP.  Her daughter is a Marine scientist.  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   Review of Systems: As per "subjective", otherwise negative.  Allergies  Allergen Reactions  . Sulfa Antibiotics Nausea And Vomiting    Current Outpatient Medications  Medication Sig Dispense Refill  . amLODipine (NORVASC) 10 MG tablet Take 10 mg by mouth daily.    Marland Kitchen aspirin 81 MG chewable tablet Chew 81 mg by mouth daily.    Marland Kitchen atorvastatin (LIPITOR) 40 MG tablet Take 1 tablet by mouth daily.    . busPIRone (BUSPAR) 15 MG tablet Take 15 mg by mouth 3 (three) times daily. Take 1/2 tablet daily     . fish oil-omega-3 fatty acids 1000 MG capsule Take 1 g by mouth 2 (two) times daily.     Marland Kitchen glipiZIDE (GLUCOTROL) 5 MG tablet Take 5 mg by mouth 2 (two) times daily before a meal.    . lisinopril (PRINIVIL,ZESTRIL) 10 MG tablet Take 10 mg by mouth daily.    Marland Kitchen LORazepam (ATIVAN) 0.5 MG tablet Take 0.5 mg by mouth 2 (two) times daily.    . metFORMIN (GLUCOPHAGE) 500 MG tablet Take 500 mg by mouth 2 (two) times daily with a meal.    . metoprolol succinate (TOPROL-XL) 50 MG 24 hr tablet Take 50 mg by mouth daily. Take with or immediately following a meal.    . mirtazapine (REMERON) 45 MG tablet Take 45 mg by mouth at bedtime.    . Multiple  Vitamin (MULTIVITAMIN WITH MINERALS) TABS tablet Take 1 tablet by mouth daily.    . nitroGLYCERIN (NITROSTAT) 0.4 MG SL tablet Place 1 tablet (0.4 mg total) under the tongue every 5 (five) minutes as needed for chest pain. 25 tablet 3  . pantoprazole (PROTONIX) 40 MG tablet TAKE 1 TABLET BY MOUTH TWICE DAILY. TAKE 30 MINS PRIOR TO MEALS 60 tablet 5   No current facility-administered medications for this visit.     Past Medical History:  Diagnosis Date  . Abnormal LFTs   . CAD (coronary artery disease)   . Diabetes ([place])    diet controlled  . GERD (gastroesophageal reflux disease)    Esophagus dilated in the 1990s per patient  . Hyperlipidemia   . Hypertension     Past Surgical History:  Procedure Laterality Date  . ABDOMINAL HYSTERECTOMY    . APPENDECTOMY    . CARDIAC CATHETERIZATION  2009   with stent  . CATARACT EXTRACTION     left  . COLONOSCOPY N/A 04/03/2015   RMR:4 polyps/mild diverticulosis/small internal hemorrhoids  . COLONOSCOPY WITH ESOPHAGOGASTRODUODENOSCOPY (EGD)  02/15/2012   GXQ:JJHER sessile polyps 3-8 mm found at the cecum/ 8 polyps measuring 3-8 mm in/1 polyp measuring 6 mm in transverse colon/3-72m polyps found in the sigmoid/6 mm polyp found in the rectum/small internal hemorrhoids  .  ESOPHAGEAL DILATION     In the 1990s  . 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 ESOPHAGOGASTRODUODENOSCOPY N/A 04/03/2015   FAO:[id number] at the gastroesophageal junction/moderate nodular gastritis  . SAVORY DILATION N/A 04/03/2015   Procedure: SAVORY DILATION;  Surgeon: Danie Binder, MD;  Location: [facility];  Service: Endoscopy;  Laterality: N/A;    Social History   Socioeconomic History  . Marital status: Divorced    Spouse name: Not on file  . Number of children: 3  . Years of education: Not on file  . Highest  education level: Not on file  Occupational History  . Not on file  Social Needs  . Financial resource strain: Not on file  . Food insecurity    Worry: Not on file    Inability: Not on file  . Transportation needs    Medical: Not on file    Non-medical: Not on file  Tobacco Use  . Smoking status: Never Smoker  . Smokeless tobacco: Never Used  Substance and Sexual Activity  . Alcohol use: No    Alcohol/week: 0.0 standard drinks  . Drug use: No  . Sexual activity: Yes    Birth control/protection: Surgical  Lifestyle  . Physical activity    Days per week: Not on file    Minutes per session: Not on file  . Stress: Not on file  Relationships  . Social Herbalist on phone: Not on file    Gets together: Not on file    Attends religious service: Not on file    Active member of club or organization: Not on file    Attends meetings of clubs or organizations: Not on file    Relationship status: Not on file  . Intimate partner violence    Fear of current or ex partner: Not on file    Emotionally abused: Not on file    Physically abused: Not on file    Forced sexual activity: Not on file  Other Topics Concern  . Not on file  Social History Narrative  . Not on file     Vitals:   10/06/18 1253  BP: 127/72  Pulse: 76  Temp: (!) 95.7 F (35.4 C)  TempSrc: Temporal  SpO2: 96%  Weight: 162 lb (73.5 kg)  Height: 5' 5"  (1.651 m)    Wt Readings from Last 3 Encounters:  10/06/18 162 lb (73.5 kg)  09/09/17 159 lb 9.6 oz (72.4 kg)  08/06/17 160 lb 9.6 oz (72.8 kg)     PHYSICAL EXAM General: NAD HEENT: Normal. Neck: No JVD, no thyromegaly. Lungs: Clear to auscultation bilaterally with normal respiratory effort. CV: Regular rate and rhythm, normal S1/S2, no S3/S4, no murmur. No pretibial or periankle edema.    Abdomen: Soft, nontender, no distention.  Neurologic: Alert and oriented.  Psych: Normal affect. Skin: Normal. Musculoskeletal: No gross deformities.     ECG: Reviewed above under Subjective   Labs: Lab Results  Component Value Date/Time   K 4.3 08/18/2017 09:03 AM   K 4.5 11/05/2011 08:54 AM   BUN 18 08/18/2017 09:03 AM   BUN 12 11/05/2011 08:54 AM   CREATININE 0.94 08/18/2017 09:03 AM   CREATININE 1.03 08/10/2012 04:20 PM   ALT 18 08/18/2017 09:03 AM   TSH 0.429 03/08/2012 10:12 AM   HGB 11.0 (L) 08/18/2017 09:03 AM  Lipids: No results found for: LDLCALC, LDLDIRECT, CHOL, TRIG, HDL     ASSESSMENT AND PLAN:  1. CAD with LAD stents: Symptomatically stable. Normal stress test 04/2015. Currently on ASA, metoprolol, and Lipitor 40 mg.  2. Essential HTN: BP is normal. No changes.  3. Hyperlipidemia: Currently on Lipitor 40 mg.  I will obtain a copy of lipids from her PCP.  4. Severe left atrial enlargement with palpitations:At risk for development of atrial fibrillation given history of CAD and hypertension, along with age and gender. Currently on Toprol-XL. I will obtain a 3 week event monitor. I will order a 2-D echocardiogram with Doppler to evaluate cardiac structure, function, and regional wall motion.   Disposition: Follow up 3 months   Kate Sable, M.D., F.A.C.C.

## 2018-10-10 ENCOUNTER — Other Ambulatory Visit: Payer: Self-pay

## 2018-10-10 ENCOUNTER — Ambulatory Visit (HOSPITAL_COMMUNITY)
Admission: RE | Admit: 2018-10-10 | Discharge: 2018-10-10 | Disposition: A | Discharge status: Home / Self Care | Payer: Medicare Other | Source: Ambulatory Visit | Attending: Cardiovascular Disease | Admitting: Cardiovascular Disease

## 2018-10-10 DIAGNOSIS — R002 Palpitations: Secondary | ICD-10-CM

## 2018-10-10 DIAGNOSIS — E119 Type 2 diabetes mellitus without complications: Secondary | ICD-10-CM | POA: Insufficient documentation

## 2018-10-10 DIAGNOSIS — I083 Combined rheumatic disorders of mitral, aortic and tricuspid valves: Secondary | ICD-10-CM | POA: Diagnosis not present

## 2018-10-10 DIAGNOSIS — K219 Gastro-esophageal reflux disease without esophagitis: Secondary | ICD-10-CM | POA: Diagnosis not present

## 2018-10-10 DIAGNOSIS — I10 Essential (primary) hypertension: Secondary | ICD-10-CM | POA: Insufficient documentation

## 2018-10-10 DIAGNOSIS — E785 Hyperlipidemia, unspecified: Secondary | ICD-10-CM | POA: Insufficient documentation

## 2018-10-10 NOTE — Progress Notes (Signed)
*  PRELIMINARY RESULTS* Echocardiogram 2D Echocardiogram has been performed.  Samuel Germany 10/10/2018, 9:08 AM

## 2018-10-14 ENCOUNTER — Ambulatory Visit (INDEPENDENT_AMBULATORY_CARE_PROVIDER_SITE_OTHER): Payer: Medicare Other

## 2018-10-14 DIAGNOSIS — R002 Palpitations: Secondary | ICD-10-CM

## 2018-10-18 ENCOUNTER — Other Ambulatory Visit: Payer: Self-pay

## 2018-11-14 ENCOUNTER — Telehealth: Payer: Self-pay | Admitting: Cardiovascular Disease

## 2018-11-14 NOTE — Telephone Encounter (Signed)
Will forward to Dr Bronson Ing for review

## 2018-11-15 ENCOUNTER — Telehealth: Payer: Self-pay | Admitting: Cardiovascular Disease

## 2018-11-15 MED ORDER — METOPROLOL SUCCINATE ER 25 MG PO TB24: 25.0000 mg | ORAL_TABLET | Freq: Every evening | ORAL | 3 refills | Status: DC

## 2018-11-15 NOTE — Telephone Encounter (Signed)
Notes recorded by Herminio Commons, MD on 11/15/2018 at 9:11 AM EDT  Sinus rhythm with occasional PACs and PVCs. One isolated 9 beat run of SVT. Average heart rate 74 bpm. Some symptoms correlated with PACs and PVCs and short SVT run, while other symptoms correlated with sinus rhythm.  Can increase Toprol-XL to 50 mg every morning and 25 mg every evening.    spoke to patient - result given and patient aware  mediction will be sent into pharmacy - 90 day supply   continue toprol xl 50 mg in morning  And added metoprolol succinate 25 mg in the evening  Patient aware to take both medication as prescribed

## 2018-11-15 NOTE — Telephone Encounter (Signed)
Resulted

## 2018-11-15 NOTE — Telephone Encounter (Signed)
See phone note 9/15

## 2018-11-15 NOTE — Telephone Encounter (Signed)
Patient calling to get results of event monitor. / tg

## 2018-11-15 NOTE — Telephone Encounter (Signed)
Daughter called wanting to know  information given to patient . She patient was confused  About the medication directions. RN informed daughter of directions and results voiced understanding.

## 2018-11-16 ENCOUNTER — Telehealth: Payer: Self-pay

## 2018-11-16 NOTE — Telephone Encounter (Signed)
-----   Message from Herminio Commons, MD sent at 11/15/2018  9:11 AM EDT ----- Sinus rhythm with occasional PACs and PVCs.  One isolated 9 beat run of SVT.  Average heart rate 74 bpm.  Some symptoms correlated with PACs and PVCs and short SVT run, while other symptoms correlated with sinus rhythm. Can increase Toprol-XL to 50 mg every morning and 25 mg every evening.

## 2018-11-16 NOTE — Telephone Encounter (Signed)
Pt made aware. Will pick up new medication this afternoon.

## 2019-02-03 ENCOUNTER — Telehealth: Payer: Medicare Other | Admitting: Cardiovascular Disease

## 2019-02-06 ENCOUNTER — Encounter: Payer: Self-pay | Admitting: Cardiovascular Disease

## 2019-02-06 ENCOUNTER — Telehealth (INDEPENDENT_AMBULATORY_CARE_PROVIDER_SITE_OTHER): Payer: Medicare Other | Admitting: Cardiovascular Disease

## 2019-02-06 VITALS — BP 123/73 | HR 76 | Ht 63.0 in | Wt 160.0 lb

## 2019-02-06 DIAGNOSIS — I10 Essential (primary) hypertension: Secondary | ICD-10-CM

## 2019-02-06 DIAGNOSIS — R002 Palpitations: Secondary | ICD-10-CM

## 2019-02-06 DIAGNOSIS — E785 Hyperlipidemia, unspecified: Secondary | ICD-10-CM

## 2019-02-06 DIAGNOSIS — Z955 Presence of coronary angioplasty implant and graft: Secondary | ICD-10-CM

## 2019-02-06 DIAGNOSIS — I25118 Atherosclerotic heart disease of native coronary artery with other forms of angina pectoris: Secondary | ICD-10-CM

## 2019-02-06 MED ORDER — METOPROLOL SUCCINATE ER 50 MG PO TB24: 50.0000 mg | ORAL_TABLET | Freq: Two times a day (BID) | ORAL | 3 refills | Status: DC

## 2019-02-06 NOTE — Addendum Note (Signed)
Addended by: Barbarann Ehlers A on: 02/06/2019 11:48 AM   Modules accepted: Orders

## 2019-02-06 NOTE — Patient Instructions (Signed)
Medication Instructions:  INCREASE Toprol to 50 mg twice a  Day  *If you need a refill on your cardiac medications before your next appointment, please call your pharmacy*  Lab Work: None today If you have labs (blood work) drawn today and your tests are completely normal, you will receive your results only by: Marland Kitchen MyChart Message (if you have MyChart) OR . A paper copy in the mail If you have any lab test that is abnormal or we need to change your treatment, we will call you to review the results.  Testing/Procedures: None today  Follow-Up: At [facility], you and your health needs are our priority.  As part of our continuing mission to provide you with exceptional heart care, we have created designated Provider Care Teams.  These Care Teams include your primary Cardiologist (physician) and Advanced Practice Providers (APPs -  Physician Assistants and Nurse Practitioners) who all work together to provide you with the care you need, when you need it.  Your next appointment:   6 month(s)  The format for your next appointment:   Virtual Visit _( Telephone)  Provider:   Kate Sable, MD  Other Instructions None     Thank you for choosing [facility] !

## 2019-02-06 NOTE — Progress Notes (Signed)
Virtual Visit via Telephone Note   This visit type was conducted due to national recommendations for restrictions regarding the COVID-19 Pandemic (e.g. social distancing) in an effort to limit this patient's exposure and mitigate transmission in our community.  Due to her co-morbid illnesses, this patient is at least at moderate risk for complications without adequate follow up.  This format is felt to be most appropriate for this patient at this time.  The patient did not have access to video technology/had technical difficulties with video requiring transitioning to audio format only (telephone).  All issues noted in this document were discussed and addressed.  No physical exam could be performed with this format.  Please refer to the patient's chart for her  consent to telehealth for [facility].   Date:  02/06/2019   ID:  Carmen [name], DOB Feb 16, 1941, MRN [id number]  Patient Location: Home Provider Location: Office  PCP:  [facility]  Cardiologist:  Kate Sable, MD  Electrophysiologist:  None   Evaluation Performed:  Follow-Up Visit  Chief Complaint:  CAD  History of Present Illness:    Carmen [name] is a 78 y.o. female with coronary artery disease with mid LAD 3 x 15 mm Promus DES on 09-15-07 in [place].  Nuclear myocardial perfusion study 04/22/15 was normal, LVEF 64%.  When I evaluated her on 10/06/2018 she was complaining of palpitations.  Her daughter is a Marine scientist.  Event monitoring reviewed below which showed occasional PACs and PVCs with one isolated 9 beat run of SVT.  Symptoms correlated with PACs, PVCs, SVT, and sinus rhythm.  I increase the dose of Toprol-XL to 50 mg every morning and 25 mg every evening.  She denies chest pain. She is still having palpitations during the day and night.  The patient does not have symptoms concerning for COVID-19 infection (fever, chills, cough, or new shortness of breath).    Past Medical  History:  Diagnosis Date  . Abnormal LFTs   . CAD (coronary artery disease)   . Diabetes ([place])    diet controlled  . GERD (gastroesophageal reflux disease)    Esophagus dilated in the 1990s per patient  . Hyperlipidemia   . Hypertension    Past Surgical History:  Procedure Laterality Date  . ABDOMINAL HYSTERECTOMY    . APPENDECTOMY    . CARDIAC CATHETERIZATION  2009   with stent  . CATARACT EXTRACTION     left  . COLONOSCOPY N/A 04/03/2015   RMR:4 polyps/mild diverticulosis/small internal hemorrhoids  . COLONOSCOPY WITH ESOPHAGOGASTRODUODENOSCOPY (EGD)  02/15/2012   AUQ:JFHLK sessile polyps 3-8 mm found at the cecum/ 8 polyps measuring 3-8 mm in/1 polyp measuring 6 mm in transverse colon/3-66m polyps found in the sigmoid/6 mm polyp found in the rectum/small internal hemorrhoids  . ESOPHAGEAL DILATION     In the 1990s  . 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 ESOPHAGOGASTRODUODENOSCOPY N/A 04/03/2015   RTGY:[id number] the gastroesophageal junction/moderate nodular gastritis  . SAVORY DILATION N/A 04/03/2015   Procedure: SAVORY DILATION;  Surgeon: SDanie Binder MD;  [facility];  Service: Endoscopy;  Laterality: N/A;     Current Meds  Medication Sig  . amLODipine (NORVASC) 10 MG tablet Take 10 mg by mouth daily.  .Marland Kitchenaspirin 81 MG chewable tablet Chew 81 mg by mouth daily.  .Marland Kitchenatorvastatin (LIPITOR) 40 MG  tablet Take 1 tablet by mouth daily.  . busPIRone (BUSPAR) 15 MG tablet Take 15 mg by mouth 3 (three) times daily. Take 1/2 tablet daily   . fish oil-omega-3 fatty acids 1000 MG capsule Take 1 g by mouth 2 (two) times daily.   Marland Kitchen glipiZIDE (GLUCOTROL) 5 MG tablet Take 5 mg by mouth 2 (two) times daily before a meal.  . lisinopril (PRINIVIL,ZESTRIL) 10 MG tablet Take 10 mg by mouth daily.  Marland Kitchen LORazepam (ATIVAN) 0.5 MG  tablet Take 0.5 mg by mouth 2 (two) times daily.  . metFORMIN (GLUCOPHAGE) 500 MG tablet Take 500 mg by mouth 2 (two) times daily with a meal.  . metoprolol succinate (TOPROL XL) 25 MG 24 hr tablet Take 1 tablet (25 mg total) by mouth every evening. Continue with  Toprol Xl 50 mg in the morning.  . metoprolol succinate (TOPROL-XL) 50 MG 24 hr tablet Take 50 mg by mouth daily. Take with or immediately following a meal.  . mirtazapine (REMERON) 45 MG tablet Take 45 mg by mouth at bedtime.  . Multiple Vitamin (MULTIVITAMIN WITH MINERALS) TABS tablet Take 1 tablet by mouth daily.  . nitroGLYCERIN (NITROSTAT) 0.4 MG SL tablet Place 1 tablet (0.4 mg total) under the tongue every 5 (five) minutes as needed for chest pain.  . pantoprazole (PROTONIX) 40 MG tablet TAKE 1 TABLET BY MOUTH TWICE DAILY. TAKE 30 MINS PRIOR TO MEALS     Allergies:   Sulfa antibiotics   Social History   Tobacco Use  . Smoking status: Never Smoker  . Smokeless tobacco: Never Used  Substance Use Topics  . Alcohol use: No    Alcohol/week: 0.0 standard drinks  . Drug use: No     Family Hx: The patient's family history includes Brain cancer in her maternal uncle; Diabetes in her brother and brother; Heart attack in her father; Hypertension in her brother and brother; Kidney disease in her mother; Prostate cancer in her maternal uncle; Stomach cancer in her maternal uncle; Throat cancer in her paternal uncle. There is no history of Colon cancer or Liver disease.  ROS:   Please see the history of present illness.     All other systems reviewed and are negative.   Prior CV studies:   The following studies were reviewed today:  Echocardiogram 10/10/2018:   1. The left ventricle has normal systolic function with an ejection fraction of 60-65%. The cavity size was normal. Mild basal septal hypertrophy. Left ventricular diastolic Doppler parameters are consistent with impaired relaxation. Elevated left  ventricular  end-diastolic pressure No evidence of left ventricular regional wall motion abnormalities.  2. The right ventricle has normal systolic function. The cavity was normal. There is no increase in right ventricular wall thickness.  3. The aortic valve is tricuspid. Mild thickening of the aortic valve. Mild aortic annular calcification noted.  4. The mitral valve is grossly normal. There is moderate mitral annular calcification present.  5. The tricuspid valve is grossly normal.  6. The aorta is normal in size and structure.  Event monitor 11/15/2018: Sinus rhythm with occasional PACs and PVCs. One isolated 9 beat run of SVT. Average heart rate 74 bpm. Some symptoms correlated with PACs and PVCs and short SVT run, while other symptoms correlated with sinus rhythm.   Labs/Other Tests and Data Reviewed:    EKG:  No ECG reviewed.  Recent Labs: No results found for requested labs within last 8760 hours.   Recent Lipid Panel No results found  for: CHOL, TRIG, HDL, CHOLHDL, LDLCALC, LDLDIRECT  Wt Readings from Last 3 Encounters:  02/06/19 160 lb (72.6 kg)  10/06/18 162 lb (73.5 kg)  09/09/17 159 lb 9.6 oz (72.4 kg)     Objective:    Vital Signs:  BP 123/73   Pulse 76   Ht 5' 3"  (1.6 m)   Wt 160 lb (72.6 kg)   BMI 28.34 kg/m    VITAL SIGNS:  reviewed  ASSESSMENT & PLAN:    1. CAD with LAD stents: Symptomatically stable. Normal stress test 04/2015. Currently on ASA, metoprolol, and Lipitor 40 mg.  2. Essential HTN:BP is normal. No changes.  3. Hyperlipidemia:Currently on Lipitor 40 mg.I will obtain a copy of lipids from her PCP.  4. Palpitations:Left atrial size was normal by echocardiogram on 10/10/2018.  Event monitoring demonstrated occasional PACs and PVCs with one isolated 9 beat run of SVT.  I increased the dose of Toprol-XL to 50 mg q am and 25 mg q pm but she is still having palpitations. I will increase Toprol XL to 50 mg bid.    COVID-19 Education: The signs and  symptoms of COVID-19 were discussed with the patient and how to seek care for testing (follow up with PCP or arrange E-visit).  The importance of social distancing was discussed today.  Time:   Today, I have spent 15 minutes with the patient with telehealth technology discussing the above problems.     Medication Adjustments/Labs and Tests Ordered: Current medicines are reviewed at length with the patient today.  Concerns regarding medicines are outlined above.   Tests Ordered: No orders of the defined types were placed in this encounter.   Medication Changes: No orders of the defined types were placed in this encounter.   Follow Up:  Virtual Visit  in 6 month(s)  Signed, Kate Sable, MD  02/06/2019 11:29 AM    [place]

## 2019-07-24 ENCOUNTER — Other Ambulatory Visit: Payer: Self-pay | Admitting: Cardiovascular Disease

## 2019-08-22 ENCOUNTER — Telehealth: Payer: Medicare Other | Admitting: Cardiovascular Disease

## 2019-08-22 ENCOUNTER — Other Ambulatory Visit: Payer: Self-pay

## 2019-08-22 ENCOUNTER — Encounter: Payer: Self-pay | Admitting: Family Medicine

## 2019-08-22 ENCOUNTER — Ambulatory Visit (INDEPENDENT_AMBULATORY_CARE_PROVIDER_SITE_OTHER): Payer: Medicare HMO | Admitting: Family Medicine

## 2019-08-22 VITALS — BP 134/68 | HR 72 | Ht 66.0 in | Wt 162.0 lb

## 2019-08-22 DIAGNOSIS — I10 Essential (primary) hypertension: Secondary | ICD-10-CM | POA: Diagnosis not present

## 2019-08-22 DIAGNOSIS — R002 Palpitations: Secondary | ICD-10-CM | POA: Diagnosis not present

## 2019-08-22 DIAGNOSIS — E785 Hyperlipidemia, unspecified: Secondary | ICD-10-CM | POA: Diagnosis not present

## 2019-08-22 DIAGNOSIS — I2510 Atherosclerotic heart disease of native coronary artery without angina pectoris: Secondary | ICD-10-CM

## 2019-08-22 MED ORDER — METOPROLOL SUCCINATE ER 100 MG PO TB24
100.0000 mg | ORAL_TABLET | Freq: Every day | ORAL | 3 refills | Status: DC
Start: 2019-08-22 — End: 2019-08-22

## 2019-08-22 MED ORDER — METOPROLOL SUCCINATE ER 100 MG PO TB24
100.0000 mg | ORAL_TABLET | Freq: Every morning | ORAL | 3 refills | Status: DC
Start: 2019-08-22 — End: 2019-09-26

## 2019-08-22 MED ORDER — METOPROLOL SUCCINATE ER 50 MG PO TB24
50.0000 mg | ORAL_TABLET | Freq: Every evening | ORAL | 3 refills | Status: DC
Start: 2019-08-22 — End: 2019-09-26

## 2019-08-22 NOTE — Progress Notes (Signed)
Cardiology Office Note  Date: 08/22/2019   ID: Carmen [name], DOB 03-21-1940, MRN [id number]  PCP:  [facility]  Cardiologist:  Kate Sable, MD Electrophysiologist:  None   Chief Complaint: Follow-up history of palpitations  History of Present Illness: Carmen [name] is a 79 y.o. female with a history of palpitations, CAD (mid LAD stent DES September 15, 2007).  Nuclear stress test 2017 normal with EF 64%.  Previous complaint of palpitations on August 08/20/2018.  Had an event monitor which showed occasional PACs and PVCs with one isolated 9 beat run of SVT.  Symptoms correlated with PACs, PACs, SVT and sinus rhythm.  Last encounter with Dr. Bronson Ing via telemedicine 02/06/2019: She was still having palpitations during the day and night she denied any chest pain.  Toprol was increased to 50 mg p.o. twice daily.  She continued on Lipitor.  Lab work was being obtained from PCP.  Blood pressure was controlled.  Symptomatically stable from a cardiac standpoint.  Continue to take aspirin, metoprolol and Lipitor  Patient is here for follow-up today.  States she continues to have frequent palpitations throughout the day.  She denies any other symptoms such as chest pain, pressure, tightness, neck, arm, back, or jaw pain.  Denies any CVA or TIA-like symptoms, orthostatic symptoms, PND, orthopnea, bleeding issues, claudication-like symptoms, DVT or PE-like symptoms.  Has some mild lower extremity edema in her left ankle.  Blood pressures reasonably controlled today.  States she has been under a lot of stress she has 2 brothers with recent CVAs.  One is in rehab and one is currently in the hospital.     Past Medical History:  Diagnosis Date  . Abnormal LFTs   . CAD (coronary artery disease)   . Diabetes ([place])    diet controlled  . GERD (gastroesophageal reflux disease)    Esophagus dilated in the 1990s per patient  . Hyperlipidemia   . Hypertension      Past Surgical History:  Procedure Laterality Date  . ABDOMINAL HYSTERECTOMY    . APPENDECTOMY    . CARDIAC CATHETERIZATION  2009   with stent  . CATARACT EXTRACTION     left  . COLONOSCOPY N/A 04/03/2015   RMR:4 polyps/mild diverticulosis/small internal hemorrhoids  . COLONOSCOPY WITH ESOPHAGOGASTRODUODENOSCOPY (EGD)  02/15/2012   GGE:ZMOQH sessile polyps 3-8 mm found at the cecum/ 8 polyps measuring 3-8 mm in/1 polyp measuring 6 mm in transverse colon/3-23m polyps found in the sigmoid/6 mm polyp found in the rectum/small internal hemorrhoids  . ESOPHAGEAL DILATION     In the 1990s  . 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 ESOPHAGOGASTRODUODENOSCOPY N/A 04/03/2015   RUTM:LYYTKPTWSat the gastroesophageal junction/moderate nodular gastritis  . SAVORY DILATION N/A 04/03/2015   Procedure: SAVORY DILATION;  Surgeon: SDanie Binder MD;  [facility];  Service: Endoscopy;  Laterality: N/A;    Current Outpatient Medications  Medication Sig Dispense Refill  . amLODipine (NORVASC) 10 MG tablet Take 10 mg by mouth daily.    .Marland Kitchenaspirin 81 MG chewable tablet Chew 81 mg by mouth daily.    .Marland Kitchenatorvastatin (LIPITOR) 40 MG tablet Take 1 tablet by mouth daily.    . busPIRone (BUSPAR) 15 MG tablet Take 15 mg by mouth 3 (three) times daily. Take 1/2 tablet daily     . glipiZIDE (GLUCOTROL)  5 MG tablet Take 5 mg by mouth 2 (two) times daily before a meal.    . lisinopril (PRINIVIL,ZESTRIL) 10 MG tablet Take 10 mg by mouth daily.    . metFORMIN (GLUCOPHAGE) 500 MG tablet Take 500 mg by mouth 2 (two) times daily with a meal.    . metoprolol succinate (TOPROL-XL) 50 MG 24 hr tablet TAKE 1 TABLET BY MOUTH TWICE DAILY. TAKE WITH OR IMMEDIATELY FOLLOWING A MEAL. 90 tablet 4  . mirtazapine (REMERON) 45 MG tablet Take 45 mg by mouth at bedtime.    .  Multiple Vitamin (MULTIVITAMIN WITH MINERALS) TABS tablet Take 1 tablet by mouth daily.    . nitroGLYCERIN (NITROSTAT) 0.4 MG SL tablet Place 1 tablet (0.4 mg total) under the tongue every 5 (five) minutes as needed for chest pain. 25 tablet 3  . pantoprazole (PROTONIX) 40 MG tablet TAKE 1 TABLET BY MOUTH TWICE DAILY. TAKE 30 MINS PRIOR TO MEALS 60 tablet 5   No current facility-administered medications for this visit.   Allergies:  Sulfa antibiotics   Social History: The patient  reports that she has never smoked. She has never used smokeless tobacco. She reports that she does not drink alcohol and does not use drugs.   Family History: The patient's family history includes Brain cancer in her maternal uncle; Diabetes in her brother and brother; Heart attack in her father; Hypertension in her brother and brother; Kidney disease in her mother; Prostate cancer in her maternal uncle; Stomach cancer in her maternal uncle; Throat cancer in her paternal uncle.   ROS:  Please see the history of present illness. Otherwise, complete review of systems is positive for none.  All other systems are reviewed and negative.   Physical Exam: VS:  BP 134/68   Pulse 72   Ht 5' 6"  (1.676 m)   Wt 162 lb (73.5 kg)   SpO2 98%   BMI 26.15 kg/m , BMI Body mass index is 26.15 kg/m.  Wt Readings from Last 3 Encounters:  08/22/19 162 lb (73.5 kg)  02/06/19 160 lb (72.6 kg)  10/06/18 162 lb (73.5 kg)    General: Patient appears comfortable at rest. Neck: Supple, no elevated JVP or carotid bruits, no thyromegaly. Lungs: Clear to auscultation, nonlabored breathing at rest. Cardiac: Regular rate and rhythm, no S3 or significant systolic murmur, no pericardial rub. Abdomen: Soft, nontender, no hepatomegaly, bowel sounds present, no guarding or rebound. Extremities: Mild non-pitting edema LLE, distal pulses 2+. Skin: Warm and dry. Musculoskeletal: No kyphosis. Neuropsychiatric: Alert and oriented x3, affect  grossly appropriate.  ECG:  An ECG dated 08/22/2019 was personally reviewed today and demonstrated:  Normal sinus rhythm rate of 72  Recent Labwork: No results found for requested labs within last 8760 hours.  No results found for: CHOL, TRIG, HDL, CHOLHDL, VLDL, LDLCALC, LDLDIRECT  Other Studies Reviewed Today:   Echocardiogram 10/10/2018:  1. The left ventricle has normal systolic function with an ejection fraction of 60-65%. The cavity size was normal. Mild basal septal hypertrophy. Left ventricular diastolic Doppler parameters are consistent with impaired relaxation. Elevated left  ventricular end-diastolic pressure No evidence of left ventricular regional wall motion abnormalities. 2. The right ventricle has normal systolic function. The cavity was normal. There is no increase in right ventricular wall thickness. 3. The aortic valve is tricuspid. Mild thickening of the aortic valve. Mild aortic annular calcification noted. 4. The mitral valve is grossly normal. There is moderate mitral annular calcification present. 5. The tricuspid valve  is grossly normal. 6. The aorta is normal in size and structure.  Event monitor 11/15/2018: Sinus rhythm with occasional PACs and PVCs. One isolated 9 beat run of SVT. Average heart rate 74 bpm. Some symptoms correlated with PACs and PVCs and short SVT run, while other symptoms correlated with sinus rhythm.  Assessment and Plan:  1. CAD in native artery   2. Essential hypertension   3. Hyperlipidemia LDL goal <70   4. Palpitations    1. CAD in native artery Denies any progressive anginal or exertional symptoms.  Continue aspirin 81 mg daily, nitroglycerin sublingual as needed for chest pain,  2. Essential hypertension Blood pressure today is 134/68.  Continue with amlodipine 10 mg daily, lisinopril 10 mg daily  3. Hyperlipidemia LDL goal <70 Continue atorvastatin 40 mg daily.  4. Palpitations Continues with palpitations  throughout the day.  She states some of the palpitations may be related to stress as she has 2 brothers who are sick and recently had CVAs.  Increase a.m. Toprol-XL to 100 mg and continue 50 mg at night.  Medication Adjustments/Labs and Tests Ordered: Current medicines are reviewed at length with the patient today.  Concerns regarding medicines are outlined above.   Disposition: Follow-up with Dr. Bronson Ing or APP 1 month Signed, Levell July, NP 08/22/2019 1:22 PM    [facility] at [place], [place], [place] [address] Phone: [phone]; Fax: [phone]

## 2019-08-22 NOTE — Patient Instructions (Addendum)
Medication Instructions:    Your physician has recommended you make the following change in your medication:   Increase metoprolol succinate (toprol xl) to 100 mg by mouth in the morning and 50 mg in the evening.   Continue other medications the same.  Labwork:  NONE  Testing/Procedures:  NONE  Follow-Up:  Your physician recommends that you schedule a follow-up appointment in: 1 month (office).  Any Other Special Instructions Will Be Listed Below (If Applicable).  If you need a refill on your cardiac medications before your next appointment, please call your pharmacy.

## 2019-09-19 ENCOUNTER — Ambulatory Visit: Payer: Medicare HMO | Admitting: Family Medicine

## 2019-09-26 ENCOUNTER — Telehealth: Payer: Self-pay | Admitting: Family Medicine

## 2019-09-26 MED ORDER — METOPROLOL SUCCINATE ER 50 MG PO TB24: 50.0000 mg | ORAL_TABLET | Freq: Every evening | ORAL | 3 refills | Status: DC

## 2019-09-26 MED ORDER — METOPROLOL SUCCINATE ER 100 MG PO TB24: 100.0000 mg | ORAL_TABLET | Freq: Every morning | ORAL | 3 refills | Status: DC

## 2019-09-26 NOTE — Telephone Encounter (Signed)
I e-scribed both Toprol; doses to [facility], they are aware

## 2019-09-26 NOTE — Telephone Encounter (Signed)
New message    ? Pharmacy is calling patient went to pick up her medication and the new changes to her  medication was not sent to them : Per patients last AVS 08/22/19   She is to take :  Increase metoprolol succinate (toprol xl) to 100 mg by mouth in the morning and 50 mg in the evening.  The pharmacy said a new prescription reflecting these changes need to be sent to them, before they will make any changes      Fax [phone]

## 2019-10-03 ENCOUNTER — Telehealth: Payer: Self-pay | Admitting: Family Medicine

## 2019-10-03 NOTE — Telephone Encounter (Signed)
Medication sent to [facility] 09/20/2019

## 2019-10-03 NOTE — Telephone Encounter (Signed)
New message      *STAT* If patient is at the pharmacy, call can be transferred to refill team.   1. Which medications need to be refilled? (please list name of each medication and dose if known) metoprolol succinate (TOPROL XL) 50 MG 24 hr tablet 2. Which pharmacy/location (including street and city if local pharmacy) is medication to be sent to [facility] in [place]   3. Do they need a 30 day or 90 day supply? [place]

## 2019-10-24 ENCOUNTER — Encounter: Payer: Self-pay | Admitting: Family Medicine

## 2019-10-24 ENCOUNTER — Other Ambulatory Visit: Payer: Self-pay

## 2019-10-24 ENCOUNTER — Ambulatory Visit (INDEPENDENT_AMBULATORY_CARE_PROVIDER_SITE_OTHER): Payer: Medicare HMO | Admitting: Family Medicine

## 2019-10-24 VITALS — BP 130/70 | HR 68 | Ht 64.0 in | Wt 157.4 lb

## 2019-10-24 DIAGNOSIS — R002 Palpitations: Secondary | ICD-10-CM

## 2019-10-24 DIAGNOSIS — Z01818 Encounter for other preprocedural examination: Secondary | ICD-10-CM

## 2019-10-24 DIAGNOSIS — I2510 Atherosclerotic heart disease of native coronary artery without angina pectoris: Secondary | ICD-10-CM | POA: Diagnosis not present

## 2019-10-24 DIAGNOSIS — E782 Mixed hyperlipidemia: Secondary | ICD-10-CM | POA: Diagnosis not present

## 2019-10-24 DIAGNOSIS — I10 Essential (primary) hypertension: Secondary | ICD-10-CM | POA: Diagnosis not present

## 2019-10-24 NOTE — Progress Notes (Addendum)
Cardiology Office Note  Date: 10/24/2019   ID: Carmen [name], DOB Jun 07, 1940, MRN [id number]  PCP:  [facility]  Cardiologist:  No primary care provider on file. Electrophysiologist:  None    Chief Complaint:Pre op clearance L TKA  Pincus Sanes MD   Orthopaedic Surgery  NPI: [id number]  [address]  [place] [address]    Phone: [phone]   History of Present Illness: Carmen [name] is a 79 y.o. female with a history of palpitations, CAD (mid LAD stent DES September 15, 2007).  Nuclear stress test 2017 normal with EF 64%.  Previous complaint of palpitations on August 08/20/2018.  Had an event monitor which showed occasional PACs and PVCs with one isolated 9 beat run of SVT.  Symptoms correlated with PACs, PACs, SVT and sinus rhythm.  Last encounter with Dr. Bronson Ing via telemedicine 02/06/2019: She was still having palpitations during the day and night she denied any chest pain.  Toprol was increased to 50 mg p.o. twice daily.  She continued on Lipitor.  Lab work was being obtained from PCP.  Blood pressure was controlled.  Symptomatically stable from a cardiac standpoint.  Continue to take aspirin, metoprolol and Lipitor  At last  follow-up she continued to have frequent palpitations throughout the day.  She denied any other symptoms such as chest pain, pressure, tightness, neck, arm, back, or jaw pain.  Denied any CVA or TIA-like symptoms, orthostatic symptoms, PND, orthopnea, bleeding issues, claudication-like symptoms, DVT or PE-like symptoms.  Had some mild lower extremity edema in her left ankle.  Stated she had been under a lot of stress she has 2 brothers with recent CVAs.  One was in rehab and one had the Covid virus.  Today she presents for preop cardiac clearance for left total knee arthroplasty with Dr. Elgie Congo orthopedic surgeon.  States she will be having preop screening done soon and the surgery is scheduled for September  29 for left total knee arthroplasty.  Continues to states she has significant stress on her due to 1 brother who has had several strokes and has issues with mobility.  Currently in a skilled nursing facility.  She denies any anginal or exertional symptoms, orthostatic symptoms, PND, orthopnea, bleeding, claudication-like symptoms, DVT or PE-like symptoms.  Denies any lower extremity edema.  No CVA or TIA-like symptoms.  She does continue to complain of palpitations which seem to randomly occur.  She continues on beta-blocker therapy 150 mg Toprol-XL daily.  Blood pressures well controlled on current therapy.  Blood pressure today is 130/70.   Past Medical History:  Diagnosis Date  . Abnormal LFTs   . CAD (coronary artery disease)   . Diabetes ([place])    diet controlled  . GERD (gastroesophageal reflux disease)    Esophagus dilated in the 1990s per patient  . Hyperlipidemia   . Hypertension     Past Surgical History:  Procedure Laterality Date  . ABDOMINAL HYSTERECTOMY    . APPENDECTOMY    . CARDIAC CATHETERIZATION  2009   with stent  . CATARACT EXTRACTION     left  . COLONOSCOPY N/A 04/03/2015   RMR:4 polyps/mild diverticulosis/small internal hemorrhoids  . COLONOSCOPY WITH ESOPHAGOGASTRODUODENOSCOPY (EGD)  02/15/2012   QXI:HWTUU sessile polyps 3-8 mm found at the cecum/ 8 polyps measuring 3-8 mm in/1 polyp measuring 6 mm in transverse colon/3-53m polyps found in the sigmoid/6 mm polyp found in the rectum/small internal hemorrhoids  . ESOPHAGEAL DILATION  In the 1990s  . 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 ESOPHAGOGASTRODUODENOSCOPY N/A 04/03/2015   NOM:VEHMCNOBS at the gastroesophageal junction/moderate nodular gastritis  . SAVORY DILATION N/A 04/03/2015   Procedure: SAVORY DILATION;  Surgeon: Danie Binder, MD;  [facility];  Service: Endoscopy;  Laterality: N/A;    Current Outpatient Medications  Medication Sig Dispense Refill  . amLODipine (NORVASC) 10 MG tablet Take 10 mg by mouth daily.    Marland Kitchen aspirin 81 MG chewable tablet Chew 81 mg by mouth daily.    Marland Kitchen atorvastatin (LIPITOR) 40 MG tablet Take 1 tablet by mouth daily.    . busPIRone (BUSPAR) 15 MG tablet Take 15 mg by mouth 3 (three) times daily. Take 1/2 tablet daily     . glipiZIDE (GLUCOTROL) 5 MG tablet Take 5 mg by mouth 2 (two) times daily before a meal.    . lisinopril (PRINIVIL,ZESTRIL) 10 MG tablet Take 10 mg by mouth daily.    . metFORMIN (GLUCOPHAGE) 500 MG tablet Take 500 mg by mouth 2 (two) times daily with a meal.    . metoprolol succinate (TOPROL XL) 50 MG 24 hr tablet Take 1 tablet (50 mg total) by mouth every evening. Take with or immediately following a meal. 90 tablet 3  . metoprolol succinate (TOPROL-XL) 100 MG 24 hr tablet Take 1 tablet (100 mg total) by mouth in the morning. Take with or immediately following a meal. 90 tablet 3  . mirtazapine (REMERON) 45 MG tablet Take 45 mg by mouth at bedtime.    . Multiple Vitamin (MULTIVITAMIN WITH MINERALS) TABS tablet Take 1 tablet by mouth daily.    . nitroGLYCERIN (NITROSTAT) 0.4 MG SL tablet Place 1 tablet (0.4 mg total) under the tongue every 5 (five) minutes as needed for chest pain. 25 tablet 3  . pantoprazole (PROTONIX) 40 MG tablet TAKE 1 TABLET BY MOUTH TWICE DAILY. TAKE 30 MINS PRIOR TO MEALS 60 tablet 5   No current facility-administered medications for this visit.   Allergies:  Sulfa antibiotics   Social History: The patient  reports that she has never smoked. She has never used smokeless tobacco. She reports that she does not drink alcohol and does not use drugs.   Family History: The patient's family history includes Brain cancer in her maternal uncle; Diabetes in her brother and brother; Heart attack in her father; Hypertension in her brother and brother; Kidney disease in  her mother; Prostate cancer in her maternal uncle; Stomach cancer in her maternal uncle; Throat cancer in her paternal uncle.   ROS:  Please see the history of present illness. Otherwise, complete review of systems is positive for none.  All other systems are reviewed and negative.   Physical Exam: VS:  BP 130/70   Pulse 68   Ht 5' 4"  (1.626 m)   Wt 157 lb 6.4 oz (71.4 kg)   SpO2 98%   BMI 27.02 kg/m , BMI Body mass index is 27.02 kg/m.  Wt Readings from Last 3 Encounters:  10/24/19 157 lb 6.4 oz (71.4 kg)  08/22/19 162 lb (73.5 kg)  02/06/19 160 lb (72.6 kg)    General: Patient appears comfortable at rest. Neck: Supple, no elevated JVP or carotid bruits, no thyromegaly. Lungs: Clear to auscultation, nonlabored breathing at rest. Cardiac: Regular rate and rhythm, no S3 or significant systolic murmur, no pericardial rub.  Abdomen: Soft, nontender, no hepatomegaly, bowel sounds present, no guarding or rebound. Extremities: Mild non-pitting edema LLE, distal pulses 2+. Skin: Warm and dry. Musculoskeletal: No kyphosis. Neuropsychiatric: Alert and oriented x3, affect grossly appropriate.  ECG:  An ECG dated 08/22/2019 was personally reviewed today and demonstrated:  Normal sinus rhythm rate of 72  Recent Labwork: No results found for requested labs within last 8760 hours.  No results found for: CHOL, TRIG, HDL, CHOLHDL, VLDL, LDLCALC, LDLDIRECT  Other Studies Reviewed Today:   Echocardiogram 10/10/2018:  1. The left ventricle has normal systolic function with an ejection fraction of 60-65%. The cavity size was normal. Mild basal septal hypertrophy. Left ventricular diastolic Doppler parameters are consistent with impaired relaxation. Elevated left  ventricular end-diastolic pressure No evidence of left ventricular regional wall motion abnormalities. 2. The right ventricle has normal systolic function. The cavity was normal. There is no increase in right ventricular wall  thickness. 3. The aortic valve is tricuspid. Mild thickening of the aortic valve. Mild aortic annular calcification noted. 4. The mitral valve is grossly normal. There is moderate mitral annular calcification present. 5. The tricuspid valve is grossly normal. 6. The aorta is normal in size and structure.  Event monitor 11/15/2018: Sinus rhythm with occasional PACs and PVCs. One isolated 9 beat run of SVT. Average heart rate 74 bpm. Some symptoms correlated with PACs and PVCs and short SVT run, while other symptoms correlated with sinus rhythm.  Her [facility] activity status index shows a score of 23.5.  Functional capacity and METS is 5.62.  Her revised cardiac risk index  Assessment and Plan:  1.  Preop cardiac clearance  Patient has a scheduled left total knee arthroplasty on September 29 with Dr. Elgie Congo orthopedic surgeon.  Her [facility] activity status index shows a score of 23.5.  Functional capacity and METS is 5.62.  Her revised cardiac risk index is 1 putting her at a 0.9% of major cardiac event perioperatively.  Patient would be considered a low risk for left total knee arthroplasty from a cardiovascular standpoint under general anesthesia.  1. CAD in native artery Denies any progressive anginal or exertional symptoms.  Continue aspirin 81 mg daily, nitroglycerin sublingual as needed for chest pain,  2. Essential hypertension Blood pressure today is 134/68.  Continue with amlodipine 10 mg daily, lisinopril 10 mg daily  3. Hyperlipidemia LDL goal <70 Continue atorvastatin 40 mg daily.  4. Palpitations Continues with palpitations throughout the day.  She states some of the palpitations may be related to stress as she has 2 brothers who are sick and recently had CVAs.  Increase a.m. Toprol-XL to 100 mg and continue 50 mg at night.  Medication Adjustments/Labs and Tests Ordered: Current medicines are reviewed at length with the patient today.  Concerns regarding medicines are outlined  above.   Disposition: Follow-up with Dr. Domenic Polite or APP 6 months. Signed, Levell July, NP 10/24/2019 11:56 AM    [facility] at [place], [place], [place] [address] Phone: [phone]; Fax: [phone]

## 2019-10-24 NOTE — Patient Instructions (Addendum)
Medication Instructions:    Your physician recommends that you continue on your current medications as directed. Please refer to the Current Medication list given to you today.  Labwork:  NONE  Testing/Procedures:  NONE  Follow-Up:  Your physician recommends that you schedule a follow-up appointment in: 6 months.  Any Other Special Instructions Will Be Listed Below (If Applicable).  If you need a refill on your cardiac medications before your next appointment, please call your pharmacy

## 2019-12-04 ENCOUNTER — Emergency Department (HOSPITAL_COMMUNITY): Payer: Medicare HMO

## 2019-12-04 ENCOUNTER — Emergency Department (HOSPITAL_COMMUNITY)
Admission: EM | Admit: 2019-12-04 | Discharge: 2019-12-04 | Disposition: A | Discharge status: Home / Self Care | Payer: Medicare HMO | Attending: Emergency Medicine | Admitting: Emergency Medicine

## 2019-12-04 ENCOUNTER — Other Ambulatory Visit: Payer: Self-pay

## 2019-12-04 ENCOUNTER — Encounter (HOSPITAL_COMMUNITY): Payer: Self-pay | Admitting: *Deleted

## 2019-12-04 DIAGNOSIS — R0602 Shortness of breath: Secondary | ICD-10-CM | POA: Insufficient documentation

## 2019-12-04 DIAGNOSIS — Z5321 Procedure and treatment not carried out due to patient leaving prior to being seen by health care provider: Secondary | ICD-10-CM | POA: Insufficient documentation

## 2019-12-04 DIAGNOSIS — R002 Palpitations: Secondary | ICD-10-CM | POA: Insufficient documentation

## 2019-12-04 NOTE — ED Triage Notes (Signed)
Pt c/o intermittent "heart fluttering" and SOB since Saturday. Pt was seen at [facility] this morning and was sent to ED.

## 2019-12-06 ENCOUNTER — Telehealth: Payer: Self-pay | Admitting: Cardiology

## 2019-12-06 NOTE — Telephone Encounter (Signed)
Carmen [name] is a former patient of Dr. Bronson Ing that I not have not seen in the office, she was last evaluated by Mr. Leonides Sake NP in August.  I reviewed the chart.  It looks like she was actually at the ER a few days ago.  Chest x-ray reports no acute process, you could certainly let her know that.  ECG shows that she was in sinus rhythm at the time.  Without her being evaluated by a provider at that point however it is hard to know whether something else could have been going on.  If she feels that her symptoms are acute and persisting, I would suggest that she be evaluated in the ER.  If on the other hand this is more of a chronic recurring problem, perhaps she could be scheduled for an office follow-up to discuss whether a cardiac monitor might be an option.

## 2019-12-06 NOTE — Telephone Encounter (Signed)
New message     Patient left message on voicemail she would like a call back about her chest xray results and would also like to speak to someone about her having a heart flutter ?

## 2019-12-06 NOTE — Telephone Encounter (Signed)
Spoke with pt who states that she was seen by her PCP and told to be seen in the ER. Pt states that she did go to the ER where they did an EKG and chest X-Ray. Pt states she left before being seen by a provider because she was number 16 in line and did not want to wait around other sick people. Pt c/o having heart fluttering. States that she gets SOB every once and awhile. She reports that the fluttering last only 1-2 mins at a time. Pt instructed to call PCP for Chest X-Ray report. Please advise.

## 2019-12-06 NOTE — Telephone Encounter (Signed)
Pt notified of Dr. Myles Gip note and she voiced understanding.

## 2020-04-22 ENCOUNTER — Encounter: Payer: Self-pay | Admitting: Internal Medicine

## 2020-05-02 ENCOUNTER — Ambulatory Visit (INDEPENDENT_AMBULATORY_CARE_PROVIDER_SITE_OTHER): Payer: Medicare HMO | Admitting: Cardiology

## 2020-05-02 ENCOUNTER — Encounter: Payer: Self-pay | Admitting: Cardiology

## 2020-05-02 VITALS — BP 152/70 | HR 68 | Ht 64.0 in | Wt 152.0 lb

## 2020-05-02 DIAGNOSIS — I25119 Atherosclerotic heart disease of native coronary artery with unspecified angina pectoris: Secondary | ICD-10-CM

## 2020-05-02 DIAGNOSIS — R002 Palpitations: Secondary | ICD-10-CM

## 2020-05-02 NOTE — Patient Instructions (Addendum)

## 2020-05-02 NOTE — Progress Notes (Signed)
Cardiology Office Note  Date: 05/02/2020   ID: Carmen [name], DOB May 18, 1940, MRN [id number]  PCP:  Carmen [name]  Cardiologist:  Carmen Lesches, MD Electrophysiologist:  None   Chief Complaint  Patient presents with  . Cardiac follow-up    History of Present Illness: Carmen [name] is a 80 y.o. female former patient of Dr. Bronson [name] now presenting to establish follow-up with me.  I reviewed her records and updated Carmen chart.  She was last seen in August 2021 by Mr. Carmen Sake NP.  She is here today with her daughter for follow-up visit.  She does not report any chest pain, but does have intermittent sense of palpitations.  She states that she has been tolerating this fairly well.  She is very active at baseline.  Cardiac monitoring from August 2020 reported sinus rhythm with occasional PACs and PVCs, brief episode of SVT lasting 9 beats.  She has been on Toprol-XL, dose increased at last visit.  Her resting heart rate is in Carmen 60s today.  Past Medical History:  Diagnosis Date  . CAD (coronary artery disease)    DES to LAD 2009  . Essential hypertension   . GERD (gastroesophageal reflux disease)   . Hyperlipidemia   . Palpitations   . Type 2 diabetes mellitus ([place])     Past Surgical History:  Procedure Laterality Date  . ABDOMINAL HYSTERECTOMY    . APPENDECTOMY    . CATARACT EXTRACTION Left   . COLONOSCOPY N/A 04/03/2015   RMR:4 polyps/mild diverticulosis/small internal hemorrhoids  . COLONOSCOPY WITH ESOPHAGOGASTRODUODENOSCOPY (EGD)  02/15/2012   UMP:NTIRW sessile polyps 3-8 mm found at Carmen cecum/ 8 polyps measuring 3-8 mm in/1 polyp measuring 6 mm in transverse colon/3-76m polyps found in Carmen sigmoid/6 mm polyp found in Carmen rectum/small internal hemorrhoids  . ESOPHAGEAL DILATION     In Carmen 1990s  . ESOPHAGEAL DILATION  02/15/2012   SLF: Stricture was found at Carmen gastroesophageal/ Non-erosive gastritis (inflammation)Sessile polyp  measuring 4 mm/duodenal mucosa showed no abnormalities   . ESOPHAGOGASTRODUODENOSCOPY N/A 04/03/2015   RERX:[id number] Carmen gastroesophageal junction/moderate nodular gastritis  . SAVORY DILATION N/A 04/03/2015   Procedure: SAVORY DILATION;  Surgeon: SDanie Binder MD;  [facility];  Service: Endoscopy;  Laterality: N/A;    Current Outpatient Medications  Medication Sig Dispense Refill  . amLODipine (NORVASC) 10 MG tablet Take 10 mg by mouth daily.    .Marland Kitchenaspirin 81 MG chewable tablet Chew 81 mg by mouth daily.    .Marland Kitchenatorvastatin (LIPITOR) 40 MG tablet Take 1 tablet by mouth daily.    . busPIRone (BUSPAR) 15 MG tablet Take 15 mg by mouth 3 (three) times daily. Take 1/2 tablet daily    . glipiZIDE (GLUCOTROL) 5 MG tablet Take 5 mg by mouth 2 (two) times daily before a meal.    . lisinopril (PRINIVIL,ZESTRIL) 10 MG tablet Take 10 mg by mouth daily.    . metFORMIN (GLUCOPHAGE) 500 MG tablet Take 500 mg by mouth 2 (two) times daily with a meal.    . metoprolol succinate (TOPROL XL) 50 MG 24 hr tablet Take 1 tablet (50 mg total) by mouth every evening. Take with or immediately following a meal. 90 tablet 3  . metoprolol succinate (TOPROL-XL) 100 MG 24 hr tablet Take 1 tablet (100 mg total) by mouth in Carmen morning. Take with or immediately following a meal. 90 tablet 3  . mirtazapine (REMERON) 45 MG tablet Take  45 mg by mouth at bedtime.    . Multiple Vitamin (MULTIVITAMIN WITH MINERALS) TABS tablet Take 1 tablet by mouth daily.    . nitroGLYCERIN (NITROSTAT) 0.4 MG SL tablet Place 1 tablet (0.4 mg total) under Carmen tongue every 5 (five) minutes as needed for chest pain. 25 tablet 3  . pantoprazole (PROTONIX) 40 MG tablet TAKE 1 TABLET BY MOUTH TWICE DAILY. TAKE 30 MINS PRIOR TO MEALS 60 tablet 5   No current facility-administered medications for this visit.   Allergies:  Sulfa antibiotics   ROS: No syncope.  Physical Exam: VS:  BP (!) 152/70   Pulse 68   Ht 5' 4"  (1.626 m)   Wt 152  lb (68.9 kg)   SpO2 98%   BMI 26.09 kg/m , BMI Body mass index is 26.09 kg/m.  Wt Readings from Last 3 Encounters:  05/02/20 152 lb (68.9 kg)  12/04/19 156 lb (70.8 kg)  10/24/19 157 lb 6.4 oz (71.4 kg)    General: Elderly woman, appears comfortable at rest. HEENT: Conjunctiva and lids normal, wearing a mask. Neck: Supple, no elevated JVP or carotid bruits, no thyromegaly. Lungs: Clear to auscultation, nonlabored breathing at rest. Cardiac: Regular rate and rhythm, no S3, soft systolic murmur, no pericardial rub. Extremities: No pitting edema.  ECG:  An ECG dated 12/04/2019 was personally reviewed today and demonstrated:  Sinus rhythm with decreased R wave progression.  Recent Labwork:  No interval lab work for review today.  Other Studies Reviewed Today:  Lexiscan Myoview 04/22/2015:  There was no ST segment deviation noted during stress.  Carmen study is normal.  This is a low risk study.  Nuclear stress EF: 64%.  Echocardiogram 10/10/2018: 1. Carmen left ventricle has normal systolic function with an ejection  fraction of 60-65%. Carmen cavity size was normal. Mild basal septal  hypertrophy. Left ventricular diastolic Doppler parameters are consistent  with impaired relaxation. Elevated left  ventricular end-diastolic pressure No evidence of left ventricular  regional wall motion abnormalities.  2. Carmen right ventricle has normal systolic function. Carmen cavity was  normal. There is no increase in right ventricular wall thickness.  3. Carmen aortic valve is tricuspid. Mild thickening of Carmen aortic valve.  Mild aortic annular calcification noted.  4. Carmen mitral valve is grossly normal. There is moderate mitral annular  calcification present.  5. Carmen tricuspid valve is grossly normal.  6. Carmen aorta is normal in size and structure.   Assessment and Plan:  1.  History of palpitations with previously documented brief SVT as well as atrial and ventricular ectopy.  She is doing  well at this point on Toprol-XL 100 mg in Carmen morning and 50 mg in Carmen evening.  Would continue observation for now.  2.  CAD status post DES to Carmen LAD in 2009.  She does not report any recent chest pain on medical therapy.  Continue aspirin, Norvasc, lisinopril, Lipitor, Toprol-XL, and as needed nitroglycerin.  Medication Adjustments/Labs and Tests Ordered: Current medicines are reviewed at length with Carmen patient today.  Concerns regarding medicines are outlined above.   Tests Ordered: No orders of Carmen defined types were placed in this encounter.   Medication Changes: No orders of Carmen defined types were placed in this encounter.   Disposition:  Follow up 6 months in Carmen [place] office.  Signed, Satira Sark, MD, [facility] 05/02/2020 1:18 PM    [facility] [place], [place], [place] [address] Phone: [phone])  [phone]; Fax: [phone]

## 2020-10-30 ENCOUNTER — Other Ambulatory Visit: Payer: Self-pay

## 2020-10-30 ENCOUNTER — Emergency Department (HOSPITAL_COMMUNITY)
Admission: EM | Admit: 2020-10-30 | Discharge: 2020-10-30 | Disposition: A | Discharge status: Home / Self Care | Payer: Medicare HMO | Attending: Emergency Medicine | Admitting: Emergency Medicine

## 2020-10-30 ENCOUNTER — Encounter (HOSPITAL_COMMUNITY): Payer: Self-pay | Admitting: Emergency Medicine

## 2020-10-30 DIAGNOSIS — Z5321 Procedure and treatment not carried out due to patient leaving prior to being seen by health care provider: Secondary | ICD-10-CM | POA: Insufficient documentation

## 2020-10-30 DIAGNOSIS — R112 Nausea with vomiting, unspecified: Secondary | ICD-10-CM | POA: Diagnosis not present

## 2020-10-30 DIAGNOSIS — R1013 Epigastric pain: Secondary | ICD-10-CM | POA: Diagnosis not present

## 2020-10-30 LAB — URINALYSIS, ROUTINE W REFLEX MICROSCOPIC
Bacteria, UA: NONE SEEN
Bilirubin Urine: NEGATIVE
Glucose, UA: NEGATIVE mg/dL
Hgb urine dipstick: NEGATIVE
Ketones, ur: NEGATIVE mg/dL
Leukocytes,Ua: NEGATIVE
Nitrite: NEGATIVE
Protein, ur: 30 mg/dL — AB
Specific Gravity, Urine: 1.015 (ref 1.005–1.030)
pH: 5 (ref 5.0–8.0)

## 2020-10-30 LAB — LIPASE, BLOOD: Lipase: 37 U/L (ref 11–51)

## 2020-10-30 LAB — CBC
HCT: 39.4 % (ref 36.0–46.0)
Hemoglobin: 11.6 g/dL — ABNORMAL LOW (ref 12.0–15.0)
MCH: 23 pg — ABNORMAL LOW (ref 26.0–34.0)
MCHC: 29.4 g/dL — ABNORMAL LOW (ref 30.0–36.0)
MCV: 78.2 fL — ABNORMAL LOW (ref 80.0–100.0)
Platelets: 243 10*3/uL (ref 150–400)
RBC: 5.04 MIL/uL (ref 3.87–5.11)
RDW: 18 % — ABNORMAL HIGH (ref 11.5–15.5)
WBC: 18.2 10*3/uL — ABNORMAL HIGH (ref 4.0–10.5)
nRBC: 0 % (ref 0.0–0.2)

## 2020-10-30 LAB — COMPREHENSIVE METABOLIC PANEL
ALT: 164 U/L — ABNORMAL HIGH (ref 0–44)
AST: 298 U/L — ABNORMAL HIGH (ref 15–41)
Albumin: 4.1 g/dL (ref 3.5–5.0)
Alkaline Phosphatase: 209 U/L — ABNORMAL HIGH (ref 38–126)
Anion gap: 11 (ref 5–15)
BUN: 20 mg/dL (ref 8–23)
CO2: 23 mmol/L (ref 22–32)
Calcium: 9 mg/dL (ref 8.9–10.3)
Chloride: 106 mmol/L (ref 98–111)
Creatinine, Ser: 1.08 mg/dL — ABNORMAL HIGH (ref 0.44–1.00)
GFR, Estimated: 52 mL/min — ABNORMAL LOW (ref >60)
Glucose, Bld: 166 mg/dL — ABNORMAL HIGH (ref 70–99)
Potassium: 3.9 mmol/L (ref 3.5–5.1)
Sodium: 140 mmol/L (ref 135–145)
Total Bilirubin: 3.4 mg/dL — ABNORMAL HIGH (ref 0.3–1.2)
Total Protein: 7.7 g/dL (ref 6.5–8.1)

## 2020-10-30 NOTE — ED Triage Notes (Signed)
Epigastric pain , n/v x 2 days

## 2020-11-01 ENCOUNTER — Emergency Department (HOSPITAL_COMMUNITY): Payer: Medicare HMO

## 2020-11-01 ENCOUNTER — Encounter (HOSPITAL_COMMUNITY): Payer: Self-pay | Admitting: Emergency Medicine

## 2020-11-01 ENCOUNTER — Observation Stay (HOSPITAL_COMMUNITY)
Admission: EM | Admit: 2020-11-01 | Discharge: 2020-11-02 | Disposition: A | Discharge status: Home / Self Care | Payer: Medicare HMO | Attending: Family Medicine | Admitting: Family Medicine

## 2020-11-01 ENCOUNTER — Other Ambulatory Visit: Payer: Self-pay

## 2020-11-01 DIAGNOSIS — Z7984 Long term (current) use of oral hypoglycemic drugs: Secondary | ICD-10-CM | POA: Diagnosis not present

## 2020-11-01 DIAGNOSIS — I10 Essential (primary) hypertension: Secondary | ICD-10-CM | POA: Diagnosis present

## 2020-11-01 DIAGNOSIS — R0902 Hypoxemia: Secondary | ICD-10-CM

## 2020-11-01 DIAGNOSIS — Z20822 Contact with and (suspected) exposure to covid-19: Secondary | ICD-10-CM | POA: Insufficient documentation

## 2020-11-01 DIAGNOSIS — I2510 Atherosclerotic heart disease of native coronary artery without angina pectoris: Secondary | ICD-10-CM | POA: Diagnosis not present

## 2020-11-01 DIAGNOSIS — Z7982 Long term (current) use of aspirin: Secondary | ICD-10-CM | POA: Insufficient documentation

## 2020-11-01 DIAGNOSIS — R17 Unspecified jaundice: Principal | ICD-10-CM | POA: Insufficient documentation

## 2020-11-01 DIAGNOSIS — D72829 Elevated white blood cell count, unspecified: Secondary | ICD-10-CM | POA: Diagnosis not present

## 2020-11-01 DIAGNOSIS — N179 Acute kidney failure, unspecified: Secondary | ICD-10-CM | POA: Diagnosis present

## 2020-11-01 DIAGNOSIS — R109 Unspecified abdominal pain: Secondary | ICD-10-CM | POA: Diagnosis present

## 2020-11-01 DIAGNOSIS — E119 Type 2 diabetes mellitus without complications: Secondary | ICD-10-CM

## 2020-11-01 DIAGNOSIS — Z79899 Other long term (current) drug therapy: Secondary | ICD-10-CM | POA: Diagnosis not present

## 2020-11-01 DIAGNOSIS — E876 Hypokalemia: Secondary | ICD-10-CM | POA: Diagnosis present

## 2020-11-01 DIAGNOSIS — N390 Urinary tract infection, site not specified: Secondary | ICD-10-CM | POA: Diagnosis present

## 2020-11-01 DIAGNOSIS — R7989 Other specified abnormal findings of blood chemistry: Secondary | ICD-10-CM | POA: Diagnosis not present

## 2020-11-01 DIAGNOSIS — R1013 Epigastric pain: Secondary | ICD-10-CM | POA: Diagnosis present

## 2020-11-01 DIAGNOSIS — R1310 Dysphagia, unspecified: Secondary | ICD-10-CM

## 2020-11-01 DIAGNOSIS — R1011 Right upper quadrant pain: Secondary | ICD-10-CM

## 2020-11-01 DIAGNOSIS — K219 Gastro-esophageal reflux disease without esophagitis: Secondary | ICD-10-CM | POA: Diagnosis present

## 2020-11-01 DIAGNOSIS — E785 Hyperlipidemia, unspecified: Secondary | ICD-10-CM

## 2020-11-01 DIAGNOSIS — R748 Abnormal levels of other serum enzymes: Secondary | ICD-10-CM

## 2020-11-01 DIAGNOSIS — K7581 Nonalcoholic steatohepatitis (NASH): Secondary | ICD-10-CM | POA: Diagnosis present

## 2020-11-01 LAB — COMPREHENSIVE METABOLIC PANEL
ALT: 108 U/L — ABNORMAL HIGH (ref 0–44)
AST: 74 U/L — ABNORMAL HIGH (ref 15–41)
Albumin: 3.3 g/dL — ABNORMAL LOW (ref 3.5–5.0)
Alkaline Phosphatase: 193 U/L — ABNORMAL HIGH (ref 38–126)
Anion gap: 10 (ref 5–15)
BUN: 28 mg/dL — ABNORMAL HIGH (ref 8–23)
CO2: 25 mmol/L (ref 22–32)
Calcium: 8.2 mg/dL — ABNORMAL LOW (ref 8.9–10.3)
Chloride: 103 mmol/L (ref 98–111)
Creatinine, Ser: 1.18 mg/dL — ABNORMAL HIGH (ref 0.44–1.00)
GFR, Estimated: 47 mL/min — ABNORMAL LOW (ref >60)
Glucose, Bld: 202 mg/dL — ABNORMAL HIGH (ref 70–99)
Potassium: 3.3 mmol/L — ABNORMAL LOW (ref 3.5–5.1)
Sodium: 138 mmol/L (ref 135–145)
Total Bilirubin: 3.2 mg/dL — ABNORMAL HIGH (ref 0.3–1.2)
Total Protein: 6.4 g/dL — ABNORMAL LOW (ref 6.5–8.1)

## 2020-11-01 LAB — CBC WITH DIFFERENTIAL/PLATELET
Abs Immature Granulocytes: 0.04 10*3/uL (ref 0.00–0.07)
Basophils Absolute: 0 10*3/uL (ref 0.0–0.1)
Basophils Relative: 0 %
Eosinophils Absolute: 0.1 10*3/uL (ref 0.0–0.5)
Eosinophils Relative: 1 %
HCT: 34.5 % — ABNORMAL LOW (ref 36.0–46.0)
Hemoglobin: 10.2 g/dL — ABNORMAL LOW (ref 12.0–15.0)
Immature Granulocytes: 0 %
Lymphocytes Relative: 17 %
Lymphs Abs: 2.1 10*3/uL (ref 0.7–4.0)
MCH: 23.4 pg — ABNORMAL LOW (ref 26.0–34.0)
MCHC: 29.6 g/dL — ABNORMAL LOW (ref 30.0–36.0)
MCV: 79.3 fL — ABNORMAL LOW (ref 80.0–100.0)
Monocytes Absolute: 0.7 10*3/uL (ref 0.1–1.0)
Monocytes Relative: 6 %
Neutro Abs: 8.9 10*3/uL — ABNORMAL HIGH (ref 1.7–7.7)
Neutrophils Relative %: 76 %
Platelets: 215 10*3/uL (ref 150–400)
RBC: 4.35 MIL/uL (ref 3.87–5.11)
RDW: 18.1 % — ABNORMAL HIGH (ref 11.5–15.5)
WBC: 11.9 10*3/uL — ABNORMAL HIGH (ref 4.0–10.5)
nRBC: 0 % (ref 0.0–0.2)

## 2020-11-01 LAB — LIPASE, BLOOD: Lipase: 30 U/L (ref 11–51)

## 2020-11-01 LAB — GLUCOSE, CAPILLARY: Glucose-Capillary: 123 mg/dL — ABNORMAL HIGH (ref 70–99)

## 2020-11-01 LAB — URINALYSIS, ROUTINE W REFLEX MICROSCOPIC
Glucose, UA: NEGATIVE mg/dL
Hgb urine dipstick: NEGATIVE
Ketones, ur: NEGATIVE mg/dL
Nitrite: POSITIVE — AB
Protein, ur: NEGATIVE mg/dL
Specific Gravity, Urine: 1.02 (ref 1.005–1.030)
pH: 5.5 (ref 5.0–8.0)

## 2020-11-01 LAB — URINALYSIS, MICROSCOPIC (REFLEX)

## 2020-11-01 LAB — RESP PANEL BY RT-PCR (FLU A&B, COVID) ARPGX2
Influenza A by PCR: NEGATIVE
Influenza B by PCR: NEGATIVE
SARS Coronavirus 2 by RT PCR: NEGATIVE

## 2020-11-01 LAB — HEMOGLOBIN A1C
Hgb A1c MFr Bld: 7.4 % — ABNORMAL HIGH (ref 4.8–5.6)
Mean Plasma Glucose: 165.68 mg/dL

## 2020-11-01 LAB — BRAIN NATRIURETIC PEPTIDE: B Natriuretic Peptide: 109 pg/mL — ABNORMAL HIGH (ref 0.0–100.0)

## 2020-11-01 LAB — MAGNESIUM: Magnesium: 1.9 mg/dL (ref 1.7–2.4)

## 2020-11-01 MED ORDER — FENTANYL CITRATE PF 50 MCG/ML IJ SOSY: 12.5000 ug | PREFILLED_SYRINGE | INTRAMUSCULAR | Status: DC | PRN

## 2020-11-01 MED ORDER — INSULIN ASPART 100 UNIT/ML IJ SOLN: 0.0000 [IU] | Freq: Three times a day (TID) | INTRAMUSCULAR | Status: DC

## 2020-11-01 MED ORDER — ASPIRIN 81 MG PO CHEW
81.0000 mg | CHEWABLE_TABLET | Freq: Every day | ORAL | Status: DC
  Administered 2020-11-02: 81 mg via ORAL
  Filled 2020-11-01: qty 1

## 2020-11-01 MED ORDER — SODIUM CHLORIDE 0.9 % IV SOLN
1.0000 g | Freq: Once | INTRAVENOUS | Status: AC
  Administered 2020-11-01: 1 g via INTRAVENOUS
  Filled 2020-11-01: qty 10

## 2020-11-01 MED ORDER — ONDANSETRON HCL 4 MG PO TABS: 4.0000 mg | ORAL_TABLET | Freq: Four times a day (QID) | ORAL | Status: DC | PRN

## 2020-11-01 MED ORDER — ADULT MULTIVITAMIN W/MINERALS CH
1.0000 | ORAL_TABLET | Freq: Every day | ORAL | Status: DC
  Administered 2020-11-01 – 2020-11-02 (×2): 1 via ORAL
  Filled 2020-11-01 (×2): qty 1

## 2020-11-01 MED ORDER — MONTELUKAST SODIUM 10 MG PO TABS
10.0000 mg | ORAL_TABLET | Freq: Every day | ORAL | Status: DC
  Administered 2020-11-01: 10 mg via ORAL
  Filled 2020-11-01: qty 1

## 2020-11-01 MED ORDER — ACETAMINOPHEN 650 MG RE SUPP: 650.0000 mg | Freq: Four times a day (QID) | RECTAL | Status: DC | PRN

## 2020-11-01 MED ORDER — ONDANSETRON HCL 4 MG/2ML IJ SOLN: 4.0000 mg | Freq: Four times a day (QID) | INTRAMUSCULAR | Status: DC | PRN

## 2020-11-01 MED ORDER — POTASSIUM CHLORIDE IN NACL 20-0.9 MEQ/L-% IV SOLN
INTRAVENOUS | Status: DC
  Filled 2020-11-01: qty 1000

## 2020-11-01 MED ORDER — METOPROLOL SUCCINATE ER 50 MG PO TB24
50.0000 mg | ORAL_TABLET | Freq: Every evening | ORAL | Status: DC
  Administered 2020-11-01: 50 mg via ORAL
  Filled 2020-11-01: qty 1

## 2020-11-01 MED ORDER — IOHEXOL 350 MG/ML SOLN
60.0000 mL | Freq: Once | INTRAVENOUS | Status: AC | PRN
  Administered 2020-11-01: 60 mL via INTRAVENOUS

## 2020-11-01 MED ORDER — SODIUM CHLORIDE 0.9 % IV SOLN: 1.0000 g | INTRAVENOUS | Status: DC

## 2020-11-01 MED ORDER — INSULIN ASPART 100 UNIT/ML IJ SOLN: 2.0000 [IU] | Freq: Three times a day (TID) | INTRAMUSCULAR | Status: DC

## 2020-11-01 MED ORDER — AMLODIPINE BESYLATE 5 MG PO TABS
10.0000 mg | ORAL_TABLET | Freq: Every day | ORAL | Status: DC
  Administered 2020-11-02: 10 mg via ORAL
  Filled 2020-11-01: qty 2

## 2020-11-01 MED ORDER — PANTOPRAZOLE SODIUM 40 MG PO TBEC
40.0000 mg | DELAYED_RELEASE_TABLET | Freq: Every day | ORAL | Status: DC
  Administered 2020-11-02: 40 mg via ORAL
  Filled 2020-11-01: qty 1

## 2020-11-01 MED ORDER — NITROGLYCERIN 0.4 MG SL SUBL: 0.4000 mg | SUBLINGUAL_TABLET | SUBLINGUAL | Status: DC | PRN

## 2020-11-01 MED ORDER — OXYCODONE HCL 5 MG PO TABS: 5.0000 mg | ORAL_TABLET | ORAL | Status: DC | PRN

## 2020-11-01 MED ORDER — HEPARIN SODIUM (PORCINE) 5000 UNIT/ML IJ SOLN
5000.0000 [IU] | Freq: Three times a day (TID) | INTRAMUSCULAR | Status: DC
  Administered 2020-11-01: 5000 [IU] via SUBCUTANEOUS
  Filled 2020-11-01 (×2): qty 1

## 2020-11-01 MED ORDER — INSULIN ASPART 100 UNIT/ML IJ SOLN: 0.0000 [IU] | Freq: Every day | INTRAMUSCULAR | Status: DC

## 2020-11-01 MED ORDER — METOPROLOL SUCCINATE ER 50 MG PO TB24
100.0000 mg | ORAL_TABLET | Freq: Every morning | ORAL | Status: DC
  Administered 2020-11-02: 100 mg via ORAL
  Filled 2020-11-01 (×2): qty 2

## 2020-11-01 MED ORDER — GADOBUTROL 1 MMOL/ML IV SOLN
7.0000 mL | Freq: Once | INTRAVENOUS | Status: AC | PRN
  Administered 2020-11-01: 7 mL via INTRAVENOUS

## 2020-11-01 MED ORDER — ACETAMINOPHEN 325 MG PO TABS: 650.0000 mg | ORAL_TABLET | Freq: Four times a day (QID) | ORAL | Status: DC | PRN

## 2020-11-01 MED ORDER — MIRTAZAPINE 15 MG PO TABS
45.0000 mg | ORAL_TABLET | Freq: Every day | ORAL | Status: DC
  Administered 2020-11-01: 45 mg via ORAL
  Filled 2020-11-01: qty 3

## 2020-11-01 MED ORDER — BUSPIRONE HCL 5 MG PO TABS
15.0000 mg | ORAL_TABLET | Freq: Three times a day (TID) | ORAL | Status: DC
  Administered 2020-11-01 – 2020-11-02 (×2): 15 mg via ORAL
  Filled 2020-11-01 (×2): qty 3

## 2020-11-01 NOTE — ED Triage Notes (Signed)
Pt c/o vomiting, upper abdominal pain, fever x 3 days. Pt reports she was here in the ED 2 days ago and had bloodwork drawn while waiting for a room but after 9 hours she decided she leave because she felt so bad.

## 2020-11-01 NOTE — ED Notes (Signed)
Pt taken off 2L O2 per providers orders

## 2020-11-01 NOTE — ED Notes (Signed)
Pt unable to give UA at this time.

## 2020-11-01 NOTE — H&P (Signed)
History and Physical  [place] NUU:[id number] DOB: 1940-08-22 DOA: 11/01/2020  PCP: [facility]  Patient coming from: Home  Level of care: Med-Surg  I have personally briefly reviewed patient's old medical records in [place]  Chief Complaint: abdominal pain and fever   HPI: Carmen [name] is a 80 y.o. female with medical history significant for hypertension, hepatic steatosis, hyperlipidemia, type 2 diabetes mellitus, GERD, coronary artery disease with stent placement, dysphagia has been having abdominal pain and fever at home that started about 3 days ago.  She presented to the emergency department on 10/31/2019 and left without being seen due to abnormally long wait times.  She had labs drawn prior to leaving.  She reports that she was having intermittent subjective fever at home and reports that her temperature was tested at 103.  She was having intermittent abdominal pain associated with eating and drinking.  She also had vomiting.  She has had poor oral intake over the past several days.  She continues to have nausea.  Her fevers have improved.  She still has intermittent vomiting.  Her symptoms have somewhat improved over the past couple of days but not completely resolved she still has abdominal pain and decided to return to the emergency department today.  ED Course: Temperature 98.1, pulse 74, blood pressure 136/39, pulse ox 89% on room air weight 69.9 kg, patient had an abdominal ultrasound that did not show any acute findings however was sent for CT scan of the abdomen that showed findings worrisome for early cholecystitis.  Her sodium was 138, potassium 3.3, glucose 202, BUN 28, creatinine 1.18, calcium 8.2, alk phos 193, albumin 3.3, AST 74, ALT 108, total bilirubin six 3.2, cardiac BNP 109.0, WBC 11.9, hemoglobin 10.2, platelets 215.  Urinalysis with many bacteria present and 11-20 WBC per high-power field.  SARS 2  coronavirus test negative, influenza testing negative.  Review of Systems: Review of Systems  Constitutional:  Positive for chills, diaphoresis, fever and malaise/fatigue.  HENT: Negative.    Eyes: Negative.   Respiratory: Negative.    Cardiovascular: Negative.   Gastrointestinal:  Positive for abdominal pain, nausea and vomiting. Negative for blood in stool, constipation and melena.  Genitourinary:  Positive for frequency and urgency.  Musculoskeletal: Negative.   Skin: Negative.   Neurological: Negative.   Endo/Heme/Allergies: Negative.   Psychiatric/Behavioral: Negative.    All other systems reviewed and are negative.   Past Medical History:  Diagnosis Date   CAD (coronary artery disease)    DES to LAD 2009   Essential hypertension    GERD (gastroesophageal reflux disease)    Hyperlipidemia    Palpitations    Type 2 diabetes mellitus ([place])     Past Surgical History:  Procedure Laterality Date   ABDOMINAL HYSTERECTOMY     APPENDECTOMY     CATARACT EXTRACTION Left    COLONOSCOPY N/A 04/03/2015   RMR:4 polyps/mild diverticulosis/small internal hemorrhoids   COLONOSCOPY WITH ESOPHAGOGASTRODUODENOSCOPY (EGD)  02/15/2012   HKV:QQVZD sessile polyps 3-8 mm found at the cecum/ 8 polyps measuring 3-8 mm in/1 polyp measuring 6 mm in transverse colon/3-23m polyps found in the sigmoid/6 mm polyp found in the rectum/small internal hemorrhoids   ESOPHAGEAL DILATION     In the 1990s   ESOPHAGEAL DILATION  02/15/2012   SLF: Stricture was found at the gastroesophageal/ Non-erosive gastritis (inflammation)Sessile polyp measuring 4 mm/duodenal mucosa showed no abnormalities    ESOPHAGOGASTRODUODENOSCOPY N/A 04/03/2015  CBU:[id number] at the gastroesophageal junction/moderate nodular gastritis   SAVORY DILATION N/A 04/03/2015   Procedure: SAVORY DILATION;  Surgeon: Danie Binder, MD;  [facility];  Service: Endoscopy;  Laterality: N/A;     reports that she has never smoked. She  has never used smokeless tobacco. She reports that she does not drink alcohol and does not use drugs.  Allergies  Allergen Reactions   Sulfa Antibiotics Nausea And Vomiting    Family History  Problem Relation Age of Onset   Heart attack Father    Kidney disease Mother    Diabetes Brother    Hypertension Brother    Stomach cancer Maternal Uncle    Throat cancer Paternal Uncle    Diabetes Brother    Hypertension Brother    Prostate cancer Maternal Uncle    Brain cancer Maternal Uncle    Colon cancer Neg Hx    Liver disease Neg Hx     Prior to Admission medications   Medication Sig Start Date End Date Taking? Authorizing Provider  amLODipine (NORVASC) 10 MG tablet Take 10 mg by mouth daily.   Yes [provider]  aspirin 81 MG chewable tablet Chew 81 mg by mouth daily.   Yes [provider]  atorvastatin (LIPITOR) 40 MG tablet Take 1 tablet by mouth daily. 05/01/17  Yes [provider]  busPIRone (BUSPAR) 15 MG tablet Take 15 mg by mouth 3 (three) times daily. Take 1/2 tablet daily   Yes [provider]  glipiZIDE (GLUCOTROL) 5 MG tablet Take 5 mg by mouth 2 (two) times daily before a meal.   Yes [provider]  lisinopril (PRINIVIL,ZESTRIL) 10 MG tablet Take 10 mg by mouth daily.   Yes [provider]  metFORMIN (GLUCOPHAGE) 500 MG tablet Take 500 mg by mouth 2 (two) times daily with a meal.   Yes [provider]  metoprolol succinate (TOPROL XL) 50 MG 24 hr tablet Take 1 tablet (50 mg total) by mouth every evening. Take with or immediately following a meal. 09/26/19  Yes Verta Ellen., NP  metoprolol succinate (TOPROL-XL) 100 MG 24 hr tablet Take 1 tablet (100 mg total) by mouth in the morning. Take with or immediately following a meal. 09/26/19 11/01/20 Yes Verta Ellen., NP  mirtazapine (REMERON) 45 MG tablet Take 45 mg by mouth at bedtime.   Yes [provider]  montelukast (SINGULAIR) 10 MG tablet  Take 10 mg by mouth daily. 10/22/20  Yes [provider]  Multiple Vitamin (MULTIVITAMIN WITH MINERALS) TABS tablet Take 1 tablet by mouth daily.   Yes [provider]  nitroGLYCERIN (NITROSTAT) 0.4 MG SL tablet Place 1 tablet (0.4 mg total) under the tongue every 5 (five) minutes as needed for chest pain. 03/29/15  Yes Herminio Commons, MD  pantoprazole (PROTONIX) 40 MG tablet TAKE 1 TABLET BY MOUTH TWICE DAILY. TAKE 30 MINS PRIOR TO MEALS 07/06/13  Yes Annitta Needs, NP    Physical Exam: Vitals:   11/01/20 1200 11/01/20 1230 11/01/20 1245 11/01/20 1311  BP: (!) 145/128 (!) 136/39    Pulse: 72 73 74   Resp: (!) 28 (!) 25 (!) 28 16  Temp:      TempSrc:      SpO2: (!) 87% 90% (!) 89% 96%  Weight:      Height:        Constitutional: frail elderly female, NAD, calm, comfortable Eyes: PERRL, lids and conjunctivae normal ENMT: Mucous  membranes are dry. Posterior pharynx clear of any exudate or lesions.Normal dentition.  Neck: normal, supple, no masses, no thyromegaly Respiratory: clear to auscultation bilaterally, no wheezing, no crackles. Normal respiratory effort. No accessory muscle use.  Cardiovascular: normal s1, s2 sounds, no murmurs / rubs / gallops. No extremity edema. 2+ pedal pulses. No carotid bruits.  Abdomen: very mild epigastric tenderness, no masses palpated. No hepatosplenomegaly. Bowel sounds positive.  Musculoskeletal: no clubbing / cyanosis. No joint deformity upper and lower extremities. Good ROM, no contractures. Normal muscle tone.  Skin: no rashes, lesions, ulcers. No induration Neurologic: CN 2-12 grossly intact. Sensation intact, DTR normal. Strength 5/5 in all 4.  Psychiatric: Normal judgment and insight. Alert and oriented x 3. Normal mood.   Labs on Admission: I have personally reviewed following labs and imaging studies  CBC: Recent Labs  Lab 10/30/20 1518 11/01/20 0827  WBC 18.2* 11.9*  NEUTROABS  --  8.9*  HGB 11.6* 10.2*  HCT 39.4  34.5*  MCV 78.2* 79.3*  PLT 243 662   Basic Metabolic Panel: Recent Labs  Lab 10/30/20 1518 11/01/20 0827  NA 140 138  K 3.9 3.3*  CL 106 103  CO2 23 25  GLUCOSE 166* 202*  BUN 20 28*  CREATININE 1.08* 1.18*  CALCIUM 9.0 8.2*   GFR: Estimated Creatinine Clearance: 38 mL/min (A) (by C-G formula based on SCr of 1.18 mg/dL (H)). Liver Function Tests: Recent Labs  Lab 10/30/20 1518 11/01/20 0827  AST 298* 74*  ALT 164* 108*  ALKPHOS 209* 193*  BILITOT 3.4* 3.2*  PROT 7.7 6.4*  ALBUMIN 4.1 3.3*   Recent Labs  Lab 10/30/20 1518 11/01/20 0827  LIPASE 37 30   No results for input(s): AMMONIA in the last 168 hours. Coagulation Profile: No results for input(s): INR, PROTIME in the last 168 hours. Cardiac Enzymes: No results for input(s): CKTOTAL, CKMB, CKMBINDEX, TROPONINI in the last 168 hours. BNP (last 3 results) No results for input(s): PROBNP in the last 8760 hours. HbA1C: No results for input(s): HGBA1C in the last 72 hours. CBG: No results for input(s): GLUCAP in the last 168 hours. Lipid Profile: No results for input(s): CHOL, HDL, LDLCALC, TRIG, CHOLHDL, LDLDIRECT in the last 72 hours. Thyroid Function Tests: No results for input(s): TSH, T4TOTAL, FREET4, T3FREE, THYROIDAB in the last 72 hours. Anemia Panel: No results for input(s): VITAMINB12, FOLATE, FERRITIN, TIBC, IRON, RETICCTPCT in the last 72 hours. Urine analysis:    Component Value Date/Time   COLORURINE AMBER (A) 11/01/2020 0816   APPEARANCEUR HAZY (A) 11/01/2020 0816   LABSPEC 1.020 11/01/2020 0816   PHURINE 5.5 11/01/2020 0816   GLUCOSEU NEGATIVE 11/01/2020 0816   HGBUR NEGATIVE 11/01/2020 0816   BILIRUBINUR MODERATE (A) 11/01/2020 0816   KETONESUR NEGATIVE 11/01/2020 0816   PROTEINUR NEGATIVE 11/01/2020 0816   UROBILINOGEN 0.2 04/04/2012 1651   NITRITE POSITIVE (A) 11/01/2020 0816   LEUKOCYTESUR SMALL (A) 11/01/2020 0816    Radiological Exams on Admission: DG Chest 2 View  Result  Date: 11/01/2020 CLINICAL DATA:  80 year old female, follow-up abnormal chest radiograph EXAM: CHEST - 2 VIEW COMPARISON:  11/01/2020 FINDINGS: The mediastinal contours are within normal limits. No cardiomegaly. Atherosclerotic calcification of the aortic arch. Previously visualized radiopaque abnormality in the right upper lobe is no longer present, likely related to scapular overlap on the prior radiograph. The lungs are clear bilaterally without evidence of focal consolidation, pleural effusion, or pneumothorax. No acute osseous abnormality. IMPRESSION: No acute cardiopulmonary process. Resolution of previously visualized  abnormal finding in the right upper lobe with improved positioning. Aortic Atherosclerosis (ICD10-I70.0). Electronically Signed   By: Ruthann Cancer M.D.   On: 11/01/2020 11:59   CT ABDOMEN PELVIS W CONTRAST  Result Date: 11/01/2020 CLINICAL DATA:  Abdominal pain, acute, nonlocalized. Upper abdominal pain, vomiting, and fever for 3 days. EXAM: CT ABDOMEN AND PELVIS WITH CONTRAST TECHNIQUE: Multidetector CT imaging of the abdomen and pelvis was performed using the standard protocol following bolus administration of intravenous contrast. CONTRAST:  110m OMNIPAQUE IOHEXOL 350 MG/ML SOLN COMPARISON:  Right upper quadrant abdominal ultrasound 11/01/2020. CT abdomen and pelvis 03/28/2012. FINDINGS: Lower chest: Mild atelectasis in the lung bases. No pleural effusion. Coronary atherosclerosis and mild cardiomegaly. Hepatobiliary: No focal liver abnormality is seen. The gallbladder is mildly to moderately distended with slight wall thickening and slight haziness in the surrounding fat, and there is a 4 mm stone in the region of the gallbladder neck. No biliary dilatation. Pancreas: Unremarkable. Spleen: Subcentimeter hypodensity in the posterior aspect of the spleen, too small to fully characterize. Mild splenomegaly. Adrenals/Urinary Tract: Unremarkable adrenal glands. Slight enlargement of a 2.1 cm  cyst in the lower pole of the right kidney compared to the prior CT with a small amount of layering milk of calcium noted. Additional subcentimeter hypodensities in both kidneys, too small to fully characterize. Unremarkable bladder. Stomach/Bowel: The stomach is unremarkable. There is no evidence of bowel obstruction. Left-sided colonic diverticulosis is noted without evidence of acute diverticulitis. History of appendectomy. Vascular/Lymphatic: Abdominal aortic atherosclerosis without aneurysm. No enlarged lymph nodes. Reproductive: Status post hysterectomy. No adnexal masses. Other: No intraperitoneal free fluid. Musculoskeletal: No suspicious osseous lesion. Unchanged sclerosis in the posterior right ilium. Mild lumbar levoscoliosis. Advanced lower lumbar facet arthrosis with grade 1 anterolisthesis of L4 on L5. IMPRESSION: 1. Small stone in the gallbladder neck with finding suspicious for early acute cholecystitis. 2. Colonic diverticulosis without evidence of diverticulitis. 3. Aortic Atherosclerosis (ICD10-I70.0). Electronically Signed   By: ALogan BoresM.D.   On: 11/01/2020 11:25   DG Chest Port 1 View  Result Date: 11/01/2020 CLINICAL DATA:  Vomiting, upper abdominal pain, fever EXAM: PORTABLE CHEST 1 VIEW COMPARISON:  Chest radiograph 12/04/2019 FINDINGS: The cardiomediastinal silhouette is stable, with unchanged calcified atherosclerotic plaque of the aortic arch. Lung volumes are low, with unchanged asymmetric elevation of the right hemidiaphragm. There is a 1.6 cm nodular opacity projecting over the right upper lobe. There is no other focal airspace disease. There is no pleural effusion. There is no pneumothorax. There is no acute osseous abnormality. IMPRESSION: 1. No radiographic evidence of acute cardiopulmonary process. 2. 1.6 cm nodular opacity projecting over the right upper lobe may be artifactual. Recommend PA and lateral chest radiograph with good inspiratory effort or nonemergent chest CT  to exclude a parenchymal nodule. Electronically Signed   By: PValetta MoleM.D.   On: 11/01/2020 10:46   UKoreaAbdomen Limited RUQ (LIVER/GB)  Result Date: 11/01/2020 CLINICAL DATA:  80year old female with right upper quadrant pain. EXAM: ULTRASOUND ABDOMEN LIMITED RIGHT UPPER QUADRANT COMPARISON:  01/22/2012 FINDINGS: Gallbladder: No gallstones, pericholecystic fluid, or gallbladder wall thickening visualized. No sonographic Murphy sign noted by sonographer. Common bile duct: Diameter: 2.3 mm Liver: No focal lesion identified. Mild diffuse increased echogenicity. Smooth echotexture. Portal vein is patent on color Doppler imaging with normal direction of blood flow towards the liver. Other: No perihepatic ascites. IMPRESSION: 1. Similar-appearing mild hepatic steatosis. 2. No acute sonographic abnormality in the right upper quadrant. Electronically Signed  By: Ruthann Cancer M.D.   On: 11/01/2020 09:29    EKG: Independently reviewed. Normal sinus rhythm  Assessment/Plan Principal Problem:   Hyperbilirubinemia Active Problems:   Nonalcoholic steatohepatitis (NASH)   GERD (gastroesophageal reflux disease)   CAD (coronary artery disease)   HTN (hypertension)   Dysphagia, idiopathic   Abdominal pain   Leukocytosis   AKI (acute kidney injury) ([place])   Type 2 diabetes mellitus ([facility])   Hyperlipidemia   UTI (urinary tract infection)   Hypokalemia   Elevated LFTs   Cholelithiasis with possible early acute cholecystitis - symptoms were much worse 2 days ago but now starting to feel better and LFTs are trending back down from 2 days ago.  Requesting surgery and GI consultation.    Hyperbilirubinemia - no significant change from 8/31.  GI and surgery consult.  Continue supportive measures.   AKI - prerenal from poor oral intake, treat with IV fluids, Following CMP.   Leukocytosis - secondary to UTI and possible acute cholecystitis - treating with IV ceftriaxone.  Check blood cultures.   Hypokalemia  - replace in IV fluid.  Follow.  Check Mg.   Type 2 diabetes mellitus - monitor CBG and provide SSI coverage.    CAD - stable, normal EKG, resume home cardiac medications.    Idiopathic dysphagia - monitor for any signs of difficulty swallowing.    DVT prophylaxis: SQ heparin   Code Status: Full   Family Communication: updated at bedside   Disposition Plan: anticipate DC home   Consults called: surgery, GI   Admission status: INP   Level of care: Med-Surg Irwin Brakeman MD [facility] How to contact the [facility] Attending or Consulting provider [place] or covering provider during after hours [place], for this patient?  Check the care team in [facility] and look for a) attending/consulting [facility] provider listed and b) the [facility] team listed Log into [email or web address] and use [place] universal password to access. If you do not have the password, please contact the hospital operator. Locate the [facility] provider you are looking for under [facility] and page to a number that you can be directly reached. If you still have difficulty reaching the provider, please page the [facility] (Director on Call) for the Hospitalists listed on amion for assistance.   If 7PM-7AM, please contact night-coverage [email or web address] Password [email or web address]  11/01/2020, 1:11 PM

## 2020-11-01 NOTE — ED Notes (Signed)
O2 turned off.  Will monitor sats.

## 2020-11-01 NOTE — ED Provider Notes (Signed)
[facility] EMERGENCY DEPARTMENT Provider Note   CSN: [id number] Arrival date & time: 11/01/20  3295     History Chief Complaint  Patient presents with   Abdominal Pain    Carmen IMPSON is a 80 y.o. female.  Patient presents chief complaint of abdominal pain and fevers.  She states that symptoms began about 3 days ago when she had epigastric abdominal pain with fevers of T-max of 103 at home.  She was seen in the ER at the time but due to the wait she left without being seen.  However before leaving labs have been drawn.  She states that symptoms have improved somewhat at home but are persistent with abdominal pain so she presents to the ER again today.  She had fevers 2 days ago but denies fevers today.  Has intermittent vomiting.  No diarrhea.  No headache no cough no shortness of breath.      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/20/2015   CAD (coronary artery disease) 11/17/2013   HTN (hypertension) 18/84/1660   Nonalcoholic steatohepatitis (NASH) 01/20/2012   GERD (gastroesophageal reflux disease) 01/20/2012   History of colonic polyps 01/20/2012    Past Surgical History:  Procedure Laterality Date   ABDOMINAL HYSTERECTOMY     APPENDECTOMY     CATARACT EXTRACTION Left    COLONOSCOPY N/A 04/03/2015   RMR:4 polyps/mild diverticulosis/small internal hemorrhoids   COLONOSCOPY WITH ESOPHAGOGASTRODUODENOSCOPY (EGD)  02/15/2012   YTK:ZSWFU sessile polyps 3-8 mm found at the cecum/ 8 polyps measuring 3-8 mm in/1 polyp measuring 6 mm in transverse colon/3-32m polyps found in the sigmoid/6 mm polyp found in the  rectum/small internal hemorrhoids   ESOPHAGEAL DILATION     In the 1990s   ESOPHAGEAL DILATION  02/15/2012   SLF: 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     Family History  Problem Relation Age of Onset   Heart attack Father    Kidney disease Mother    Diabetes Brother    Hypertension Brother    Stomach cancer Maternal Uncle    Throat cancer Paternal Uncle    Diabetes Brother    Hypertension Brother    Prostate cancer Maternal Uncle    Brain cancer Maternal Uncle    Colon cancer Neg Hx    Liver disease Neg Hx     Social History   Tobacco Use   Smoking status: Never   Smokeless tobacco: Never  Vaping Use   Vaping Use: Never used  Substance Use Topics   Alcohol use: No    Alcohol/week: 0.0 standard drinks   Drug use: No    Home Medications Prior to Admission medications   Medication Sig  Start Date End Date Taking? Authorizing Provider  amLODipine (NORVASC) 10 MG tablet Take 10 mg by mouth daily.   Yes [provider]  aspirin 81 MG chewable tablet Chew 81 mg by mouth daily.   Yes [provider]  atorvastatin (LIPITOR) 40 MG tablet Take 1 tablet by mouth daily. 05/01/17  Yes [provider]  busPIRone (BUSPAR) 15 MG tablet Take 15 mg by mouth 3 (three) times daily. Take 1/2 tablet daily   Yes [provider]  glipiZIDE (GLUCOTROL) 5 MG tablet Take 5 mg by mouth 2 (two) times daily before a meal.   Yes [provider]  lisinopril (PRINIVIL,ZESTRIL) 10 MG tablet Take 10 mg by mouth daily.   Yes  [provider]  metFORMIN (GLUCOPHAGE) 500 MG tablet Take 500 mg by mouth 2 (two) times daily with a meal.   Yes [provider]  metoprolol succinate (TOPROL XL) 50 MG 24 hr tablet Take 1 tablet (50 mg total) by mouth every evening. Take with or immediately following a meal. 09/26/19  Yes Verta Ellen., NP  metoprolol succinate (TOPROL-XL) 100 MG 24 hr tablet Take 1 tablet (100 mg total) by mouth in the morning. Take with or immediately following a meal. 09/26/19 11/01/20 Yes Verta Ellen., NP  mirtazapine (REMERON) 45 MG tablet Take 45 mg by mouth at bedtime.   Yes [provider]  montelukast (SINGULAIR) 10 MG tablet Take 10 mg by mouth daily. 10/22/20  Yes [provider]  Multiple Vitamin (MULTIVITAMIN WITH MINERALS) TABS tablet Take 1 tablet by mouth daily.   Yes [provider]  nitroGLYCERIN (NITROSTAT) 0.4 MG SL tablet Place 1 tablet (0.4 mg total) under the tongue every 5 (five) minutes as needed for chest pain. 03/29/15  Yes Herminio Commons, MD  pantoprazole (PROTONIX) 40 MG tablet TAKE 1 TABLET BY MOUTH TWICE DAILY. TAKE 30 MINS PRIOR TO MEALS 07/06/13  Yes Annitta Needs, NP    Allergies    Sulfa antibiotics  Review of Systems   Review of Systems  Constitutional:  Positive for fever.  HENT:  Negative for ear pain.   Eyes:  Negative for pain.  Respiratory:  Negative for cough.   Cardiovascular:  Negative for chest pain.  Gastrointestinal:  Positive for abdominal pain.  Genitourinary:  Negative for flank pain.  Musculoskeletal:  Negative for back pain.  Skin:  Negative for rash.  Neurological:  Negative for headaches.   Physical Exam Updated Vital Signs BP (!) 136/39   Pulse 74   Temp 98.1 F (36.7 C) (Oral)   Resp (!) 28   Ht 5' 5"  (1.651 m)   Wt 69.9 kg   SpO2 (!) 89%   BMI 25.63 kg/m   Physical Exam Constitutional:      General: She is not in acute distress.    Appearance: Normal appearance.  HENT:      Head: Normocephalic.     Nose: Nose normal.  Eyes:     Extraocular Movements: Extraocular movements intact.  Cardiovascular:     Rate and Rhythm: Normal rate.  Pulmonary:     Effort: Pulmonary effort is normal.  Abdominal:     Tenderness: There is no guarding or rebound.     Comments: Mild to moderate upper abdominal tenderness.  Does not specifically localize any particular location.  Relatively benign exam.  Musculoskeletal:        General: Normal range of motion.     Cervical back:  Normal range of motion.  Neurological:     General: No focal deficit present.     Mental Status: She is alert. Mental status is at baseline.    ED Results / Procedures / Treatments   Labs (all labs ordered are listed, but only abnormal results are displayed) Labs Reviewed  CBC WITH DIFFERENTIAL/PLATELET - Abnormal; Notable for the following components:      Result Value   WBC 11.9 (*)    Hemoglobin 10.2 (*)    HCT 34.5 (*)    MCV 79.3 (*)    MCH 23.4 (*)    MCHC 29.6 (*)    RDW 18.1 (*)    Neutro Abs 8.9 (*)    All other components within normal limits  COMPREHENSIVE METABOLIC PANEL - Abnormal; Notable for the following components:   Potassium 3.3 (*)    Glucose, Bld 202 (*)    BUN 28 (*)    Creatinine, Ser 1.18 (*)    Calcium 8.2 (*)    Total Protein 6.4 (*)    Albumin 3.3 (*)    AST 74 (*)    ALT 108 (*)    Alkaline Phosphatase 193 (*)    Total Bilirubin 3.2 (*)    GFR, Estimated 47 (*)    All other components within normal limits  URINALYSIS, ROUTINE W REFLEX MICROSCOPIC - Abnormal; Notable for the following components:   Color, Urine AMBER (*)    APPearance HAZY (*)    Bilirubin Urine MODERATE (*)    Nitrite POSITIVE (*)    Leukocytes,Ua SMALL (*)    All other components within normal limits  BRAIN NATRIURETIC PEPTIDE - Abnormal; Notable for the following components:   B Natriuretic Peptide 109.0 (*)    All other components within normal limits  URINALYSIS, MICROSCOPIC  (REFLEX) - Abnormal; Notable for the following components:   Bacteria, UA MANY (*)    All other components within normal limits  RESP PANEL BY RT-PCR (FLU A&B, COVID) ARPGX2  LIPASE, BLOOD    EKG None  Radiology DG Chest 2 View  Result Date: 11/01/2020 CLINICAL DATA:  80 year old female, follow-up abnormal chest radiograph EXAM: CHEST - 2 VIEW COMPARISON:  11/01/2020 FINDINGS: The mediastinal contours are within normal limits. No cardiomegaly. Atherosclerotic calcification of the aortic arch. Previously visualized radiopaque abnormality in the right upper lobe is no longer present, likely related to scapular overlap on the prior radiograph. The lungs are clear bilaterally without evidence of focal consolidation, pleural effusion, or pneumothorax. No acute osseous abnormality. IMPRESSION: No acute cardiopulmonary process. Resolution of previously visualized abnormal finding in the right upper lobe with improved positioning. Aortic Atherosclerosis (ICD10-I70.0). Electronically Signed   By: Ruthann Cancer M.D.   On: 11/01/2020 11:59   CT ABDOMEN PELVIS W CONTRAST  Result Date: 11/01/2020 CLINICAL DATA:  Abdominal pain, acute, nonlocalized. Upper abdominal pain, vomiting, and fever for 3 days. EXAM: CT ABDOMEN AND PELVIS WITH CONTRAST TECHNIQUE: Multidetector CT imaging of the abdomen and pelvis was performed using the standard protocol following bolus administration of intravenous contrast. CONTRAST:  18m OMNIPAQUE IOHEXOL 350 MG/ML SOLN COMPARISON:  Right upper quadrant abdominal ultrasound 11/01/2020. CT abdomen and pelvis 03/28/2012. FINDINGS: Lower chest: Mild atelectasis in the lung bases. No pleural effusion. Coronary atherosclerosis and mild cardiomegaly. Hepatobiliary: No focal liver abnormality is seen. The gallbladder is mildly to moderately distended with slight wall thickening and slight haziness in the surrounding fat, and there is a 4 mm stone in the region of the gallbladder  neck. No  biliary dilatation. Pancreas: Unremarkable. Spleen: Subcentimeter hypodensity in the posterior aspect of the spleen, too small to fully characterize. Mild splenomegaly. Adrenals/Urinary Tract: Unremarkable adrenal glands. Slight enlargement of a 2.1 cm cyst in the lower pole of the right kidney compared to the prior CT with a small amount of layering milk of calcium noted. Additional subcentimeter hypodensities in both kidneys, too small to fully characterize. Unremarkable bladder. Stomach/Bowel: The stomach is unremarkable. There is no evidence of bowel obstruction. Left-sided colonic diverticulosis is noted without evidence of acute diverticulitis. History of appendectomy. Vascular/Lymphatic: Abdominal aortic atherosclerosis without aneurysm. No enlarged lymph nodes. Reproductive: Status post hysterectomy. No adnexal masses. Other: No intraperitoneal free fluid. Musculoskeletal: No suspicious osseous lesion. Unchanged sclerosis in the posterior right ilium. Mild lumbar levoscoliosis. Advanced lower lumbar facet arthrosis with grade 1 anterolisthesis of L4 on L5. IMPRESSION: 1. Small stone in the gallbladder neck with finding suspicious for early acute cholecystitis. 2. Colonic diverticulosis without evidence of diverticulitis. 3. Aortic Atherosclerosis (ICD10-I70.0). Electronically Signed   By: Logan Bores M.D.   On: 11/01/2020 11:25   DG Chest Port 1 View  Result Date: 11/01/2020 CLINICAL DATA:  Vomiting, upper abdominal pain, fever EXAM: PORTABLE CHEST 1 VIEW COMPARISON:  Chest radiograph 12/04/2019 FINDINGS: The cardiomediastinal silhouette is stable, with unchanged calcified atherosclerotic plaque of the aortic arch. Lung volumes are low, with unchanged asymmetric elevation of the right hemidiaphragm. There is a 1.6 cm nodular opacity projecting over the right upper lobe. There is no other focal airspace disease. There is no pleural effusion. There is no pneumothorax. There is no acute osseous abnormality.  IMPRESSION: 1. No radiographic evidence of acute cardiopulmonary process. 2. 1.6 cm nodular opacity projecting over the right upper lobe may be artifactual. Recommend PA and lateral chest radiograph with good inspiratory effort or nonemergent chest CT to exclude a parenchymal nodule. Electronically Signed   By: Valetta Mole M.D.   On: 11/01/2020 10:46   US Abdomen Limited RUQ (LIVER/GB)  Result Date: 11/01/2020 CLINICAL DATA:  80 year old female with right upper quadrant pain. EXAM: ULTRASOUND ABDOMEN LIMITED RIGHT UPPER QUADRANT COMPARISON:  01/22/2012 FINDINGS: Gallbladder: No gallstones, pericholecystic fluid, or gallbladder wall thickening visualized. No sonographic Murphy sign noted by sonographer. Common bile duct: Diameter: 2.3 mm Liver: No focal lesion identified. Mild diffuse increased echogenicity. Smooth echotexture. Portal vein is patent on color Doppler imaging with normal direction of blood flow towards the liver. Other: No perihepatic ascites. IMPRESSION: 1. Similar-appearing mild hepatic steatosis. 2. No acute sonographic abnormality in the right upper quadrant. Electronically Signed   By: Ruthann Cancer M.D.   On: 11/01/2020 09:29    Procedures .Critical Care  Date/Time: 11/01/2020 1:04 PM Performed by: Luna Fuse, MD Authorized by: Luna Fuse, MD   Critical care provider statement:    Critical care time (minutes):  30   Critical care time was exclusive of:  Separately billable procedures and treating other patients   Critical care was necessary to treat or prevent imminent or life-threatening deterioration of the following conditions:  Respiratory failure   Medications Ordered in ED Medications  cefTRIAXone (ROCEPHIN) 1 g in sodium chloride 0.9 % 100 mL IVPB (1 g Intravenous New Bag/Given 11/01/20 1249)  iohexol (OMNIPAQUE) 350 MG/ML injection 60 mL (60 mLs Intravenous Contrast Given 11/01/20 1043)    ED Course  I have reviewed the triage vital signs and the nursing  notes.  Pertinent labs & imaging results that were available during my care of  the patient were reviewed by me and considered in my medical decision making (see chart for details).    MDM Rules/Calculators/A&P                           Review of prior labs a few days ago were concerning.  Patient has newly elevated liver enzymes T bili 3.2, white count of 18 with reported fevers at home of 103 T-max.  Labs today appear much improved although liver enzymes appear similar, white count is normalized to 11.  Patient no longer having fevers.  Abdominal pain appears improved significantly.  Given findings ultrasound pursued with no acute findings noted.  CT abdomen pelvis concerning for early acute cholecystitis with stone in the neck of the gallbladder.  Given entirety of patient's findings concern for waxing waning cholecystitis.  Case discussed with on-call surgeon, recommends medical admission and GI evaluation.  Medicine has been consulted.  To be hypoxemic.  Etiology is unclear.  She denies any symptoms of shortness of breath but found to be at 88 to 89% on room air which is new for her.  Chest x-ray is unremarkable proBNP is normal as well.  Final Clinical Impression(s) / ED Diagnoses Final diagnoses:  RUQ pain  Epigastric pain  Elevated liver enzymes  Hypoxemia    Rx / DC Orders ED Discharge Orders     None        Luna Fuse, MD 11/01/20 [phone]

## 2020-11-01 NOTE — ED Notes (Signed)
Patient returned from MRI.

## 2020-11-01 NOTE — ED Notes (Signed)
Pt placed back on 2L [place] due to desats into 88-89%

## 2020-11-01 NOTE — ED Notes (Signed)
Pt desated to 86%.  Pt placed on 2L[place].  She is now 95%.  She is laying flat and resting.  Pt denies distress or SOB.

## 2020-11-01 NOTE — Consult Note (Signed)
Patient seen, chart reviewed, full note to follow.  Currently undergoing MRCP.  Discussed with Dr. Wynetta Emery.

## 2020-11-01 NOTE — ED Notes (Signed)
Patient transported to MRI 

## 2020-11-02 DIAGNOSIS — R1011 Right upper quadrant pain: Secondary | ICD-10-CM

## 2020-11-02 DIAGNOSIS — R17 Unspecified jaundice: Secondary | ICD-10-CM | POA: Diagnosis not present

## 2020-11-02 DIAGNOSIS — N179 Acute kidney failure, unspecified: Secondary | ICD-10-CM | POA: Diagnosis not present

## 2020-11-02 DIAGNOSIS — R1013 Epigastric pain: Secondary | ICD-10-CM | POA: Diagnosis not present

## 2020-11-02 DIAGNOSIS — I2510 Atherosclerotic heart disease of native coronary artery without angina pectoris: Secondary | ICD-10-CM | POA: Diagnosis not present

## 2020-11-02 DIAGNOSIS — R748 Abnormal levels of other serum enzymes: Secondary | ICD-10-CM | POA: Diagnosis not present

## 2020-11-02 LAB — COMPREHENSIVE METABOLIC PANEL
ALT: 81 U/L — ABNORMAL HIGH (ref 0–44)
AST: 57 U/L — ABNORMAL HIGH (ref 15–41)
Albumin: 2.9 g/dL — ABNORMAL LOW (ref 3.5–5.0)
Alkaline Phosphatase: 187 U/L — ABNORMAL HIGH (ref 38–126)
Anion gap: 10 (ref 5–15)
BUN: 14 mg/dL (ref 8–23)
CO2: 26 mmol/L (ref 22–32)
Calcium: 8.5 mg/dL — ABNORMAL LOW (ref 8.9–10.3)
Chloride: 107 mmol/L (ref 98–111)
Creatinine, Ser: 0.69 mg/dL (ref 0.44–1.00)
GFR, Estimated: >60 mL/min (ref >60)
Glucose, Bld: 101 mg/dL — ABNORMAL HIGH (ref 70–99)
Potassium: 3.7 mmol/L (ref 3.5–5.1)
Sodium: 143 mmol/L (ref 135–145)
Total Bilirubin: 1.9 mg/dL — ABNORMAL HIGH (ref 0.3–1.2)
Total Protein: 5.9 g/dL — ABNORMAL LOW (ref 6.5–8.1)

## 2020-11-02 LAB — MAGNESIUM: Magnesium: 2 mg/dL (ref 1.7–2.4)

## 2020-11-02 LAB — GLUCOSE, CAPILLARY
Glucose-Capillary: 79 mg/dL (ref 70–99)
Glucose-Capillary: 129 mg/dL — ABNORMAL HIGH (ref 70–99)
Glucose-Capillary: 161 mg/dL — ABNORMAL HIGH (ref 70–99)

## 2020-11-02 LAB — CBC WITH DIFFERENTIAL/PLATELET
Abs Immature Granulocytes: 0.03 10*3/uL (ref 0.00–0.07)
Basophils Absolute: 0 10*3/uL (ref 0.0–0.1)
Basophils Relative: 1 %
Eosinophils Absolute: 0.1 10*3/uL (ref 0.0–0.5)
Eosinophils Relative: 1 %
HCT: 32.6 % — ABNORMAL LOW (ref 36.0–46.0)
Hemoglobin: 9.4 g/dL — ABNORMAL LOW (ref 12.0–15.0)
Immature Granulocytes: 0 %
Lymphocytes Relative: 38 %
Lymphs Abs: 2.6 10*3/uL (ref 0.7–4.0)
MCH: 23.2 pg — ABNORMAL LOW (ref 26.0–34.0)
MCHC: 28.8 g/dL — ABNORMAL LOW (ref 30.0–36.0)
MCV: 80.3 fL (ref 80.0–100.0)
Monocytes Absolute: 0.5 10*3/uL (ref 0.1–1.0)
Monocytes Relative: 8 %
Neutro Abs: 3.6 10*3/uL (ref 1.7–7.7)
Neutrophils Relative %: 52 %
Platelets: 178 10*3/uL (ref 150–400)
RBC: 4.06 MIL/uL (ref 3.87–5.11)
RDW: 18 % — ABNORMAL HIGH (ref 11.5–15.5)
WBC: 6.9 10*3/uL (ref 4.0–10.5)
nRBC: 0 % (ref 0.0–0.2)

## 2020-11-02 LAB — LIPASE, BLOOD: Lipase: 27 U/L (ref 11–51)

## 2020-11-02 MED ORDER — CEFDINIR 300 MG PO CAPS: 600.0000 mg | ORAL_CAPSULE | Freq: Every day | ORAL | 0 refills | Status: AC

## 2020-11-02 MED ORDER — CEPHALEXIN 250 MG PO CAPS: 250.0000 mg | ORAL_CAPSULE | Freq: Every day | ORAL | Status: DC

## 2020-11-02 NOTE — Care Management CC44 (Signed)
Condition Code 44 Documentation Completed  Patient Details  Name: SYANA DEGRAFFENREID MRN: [id number] Date of Birth: 04-02-1940   Condition Code 44 given:  Yes Patient signature on Condition Code 44 notice:  Yes Documentation of 2 MD's agreement:  Yes Code 44 added to claim:  Yes    Natasha Bence, LCSW 11/02/2020, 12:16 PM

## 2020-11-02 NOTE — Discharge Instructions (Signed)
IMPORTANT INFORMATION: PAY CLOSE ATTENTION   PHYSICIAN DISCHARGE INSTRUCTIONS  Follow with Primary care provider  [facility]  and other consultants as instructed by your Hospitalist Physician  [place] IF SYMPTOMS COME BACK, WORSEN OR NEW PROBLEM DEVELOPS   Please note: You were cared for by a hospitalist during your hospital stay. Every effort will be made to forward records to your primary care provider.  You can request that your primary care provider send for your hospital records if they have not received them.  Once you are discharged, your primary care physician will handle any further medical issues. Please note that NO REFILLS for any discharge medications will be authorized once you are discharged, as it is imperative that you return to your primary care physician (or establish a relationship with a primary care physician if you do not have one) for your post hospital discharge needs so that they can reassess your need for medications and monitor your lab values.  Please get a complete blood count and chemistry panel checked by your Primary MD at your next visit, and again as instructed by your Primary MD.  Get Medicines reviewed and adjusted: Please take all your medications with you for your next visit with your Primary MD  Laboratory/radiological data: Please request your Primary MD to go over all hospital tests and procedure/radiological results at the follow up, please ask your primary care provider to get all Hospital records sent to his/her office.  In some cases, they will be blood work, cultures and biopsy results pending at the time of your discharge. Please request that your primary care provider follow up on these results.  If you are diabetic, please bring your blood sugar readings with you to your follow up appointment with primary care.    Please call and make your follow up appointments as soon as  possible.    Also Note the following: If you experience worsening of your admission symptoms, develop shortness of breath, life threatening emergency, suicidal or homicidal thoughts you must seek medical attention immediately by calling 911 or calling your MD immediately  if symptoms less severe.  You must read complete instructions/literature along with all the possible adverse reactions/side effects for all the Medicines you take and that have been prescribed to you. Take any new Medicines after you have completely understood and accpet all the possible adverse reactions/side effects.   Do not drive when taking Pain medications or sleeping medications (Benzodiazepines)  Do not take more than prescribed Pain, Sleep and Anxiety Medications. It is not advisable to combine anxiety,sleep and pain medications without talking with your primary care practitioner  Special Instructions: If you have smoked or chewed Tobacco  in the last 2 yrs please stop smoking, stop any regular Alcohol  and or any Recreational drug use.  Wear Seat belts while driving.  Do not drive if taking any narcotic, mind altering or controlled substances or recreational drugs or alcohol.

## 2020-11-02 NOTE — Progress Notes (Signed)
When this LPN went assist pt to [facility] pt stated" she had already gone in her pull up, and would change it when she got up". Pt lying in bed with eyes closed no s/s of pain or discomfort. Bed alarm on. Call bell placed in reach with bed placed in lowest position with wheels locked.

## 2020-11-02 NOTE — Discharge Summary (Signed)
Physician Discharge Summary  Carmen [name] DJM:[id number] DOB: [birth year] DOA: 11/01/2020  PCP: [facility] date: 11/01/2020 Discharge date: 11/02/2020  Admitted From:  Home  Disposition:  Home   Recommendations for Outpatient Follow-up:  Follow up with PCP in 1 weeks Follow up with GI in 2-3 weeks Follow up with surgery in 2-3 weeks  Discharge Condition: STABLE   CODE STATUS: FULL DIET: low fat heart healthy soft foods   Brief Hospitalization Summary: Please see all hospital notes, images, labs for full details of the hospitalization. ADMISSION HPI: Carmen [name] is a 80 y.o. female with medical history significant for hypertension, hepatic steatosis, hyperlipidemia, type 2 diabetes mellitus, GERD, coronary artery disease with stent placement, dysphagia has been having abdominal pain and fever at home that started about 3 days ago.  She presented to the emergency department on 10/31/2019 and left without being seen due to abnormally long wait times.  She had labs drawn prior to leaving.  She reports that she was having intermittent subjective fever at home and reports that her temperature was tested at 103.  She was having intermittent abdominal pain associated with eating and drinking.  She also had vomiting.  She has had poor oral intake over the past several days.  She continues to have nausea.  Her fevers have improved.  She still has intermittent vomiting.  Her symptoms have somewhat improved over the past couple of days but not completely resolved she still has abdominal pain and decided to return to the emergency department today.   ED Course: Temperature 98.1, pulse 74, blood pressure 136/39, pulse ox 89% on room air weight 69.9 kg, patient had an abdominal ultrasound that did not show any acute findings however was sent for CT scan of the abdomen that showed findings worrisome for early cholecystitis.  Her sodium was 138, potassium 3.3, glucose 202,  BUN 28, creatinine 1.18, calcium 8.2, alk phos 193, albumin 3.3, AST 74, ALT 108, total bilirubin six 3.2, cardiac BNP 109.0, WBC 11.9, hemoglobin 10.2, platelets 215.  Urinalysis with many bacteria present and 11-20 WBC per high-power field.  SARS 2 coronavirus test negative, influenza testing negative.  HOSPITAL COURSE   Patient was admitted with concern for cholelithiasis and possible early acute cholecystitis associated with abdominal pain.  She was noted to have elevated LFTs and hyperbilirubinemia.  She had an abdominal ultrasound and MRCP that did not show any findings of cholelithiasis or cholecystitis.  She was seen in consultation by general surgeon Dr. Arnoldo Morale.  She was noted to have a UTI treated with IV ceftriaxone.  She had an AKI that was treated with IV fluids and has resolved.  Hypokalemia has been treated and repleted.  Fortunately after supportive measures her liver enzymes are trending down, her bilirubin is trending down.  She is tolerating diet and feeling better.  I discussed this with Dr. Arnoldo Morale and he said that because she is clinically improving she can resume her regular diet and discharge home today.  Follow-up if she has recurrent symptoms and at that point she likely would need an EUS.  He does not recommend cholecystectomy at this time.  Outpatient follow-up with Dr. Arnoldo Morale IN 2 to 3 weeks.  GI doctor on call Dr. Jenetta Downer did not feel that she needed inpatient GI consultation at this time.  I recommended that patient follow-up with [facility] at discharge regarding elevated LFTs.  Verbalized understanding.  Patient is stable to discharge home with  outpatient follow-up as recommended.  Patient also advised to return if symptoms come back.   Discharge Diagnoses:  Principal Problem:   Hyperbilirubinemia Active Problems:   Nonalcoholic steatohepatitis (NASH)   GERD (gastroesophageal reflux disease)   CAD (coronary artery disease)   HTN (hypertension)   Dysphagia,  idiopathic   Abdominal pain   Leukocytosis   AKI (acute kidney injury) ([place])   Type 2 diabetes mellitus (HCC)   Hyperlipidemia   UTI (urinary tract infection)   Hypokalemia   Elevated LFTs   Elevated liver enzymes   RUQ pain   Discharge Instructions:  Allergies as of 11/02/2020       Reactions   Sulfa Antibiotics Nausea And Vomiting        Medication List     TAKE these medications    amLODipine 10 MG tablet Commonly known as: NORVASC Take 10 mg by mouth daily.   aspirin 81 MG chewable tablet Chew 81 mg by mouth daily.   atorvastatin 40 MG tablet Commonly known as: LIPITOR Take 1 tablet by mouth daily.   busPIRone 15 MG tablet Commonly known as: BUSPAR Take 15 mg by mouth 3 (three) times daily. Take 1/2 tablet daily   cefdinir 300 MG capsule Commonly known as: OMNICEF Take 2 capsules (600 mg total) by mouth daily for 3 days.   cephALEXin 250 MG capsule Commonly known as: KEFLEX Take 1 capsule (250 mg total) by mouth daily at 8 pm. Start taking on: November 07, 2020 What changed: These instructions start on November 07, 2020. If you are unsure what to do until then, ask your doctor or other care provider.   glipiZIDE 5 MG tablet Commonly known as: GLUCOTROL Take 5 mg by mouth 2 (two) times daily before a meal.   lisinopril 10 MG tablet Commonly known as: ZESTRIL Take 10 mg by mouth daily.   metFORMIN 500 MG tablet Commonly known as: GLUCOPHAGE Take 500 mg by mouth 2 (two) times daily with a meal.   metoprolol succinate 50 MG 24 hr tablet Commonly known as: Toprol XL Take 1 tablet (50 mg total) by mouth every evening. Take with or immediately following a meal.   metoprolol succinate 100 MG 24 hr tablet Commonly known as: TOPROL-XL Take 1 tablet (100 mg total) by mouth in the morning. Take with or immediately following a meal.   mirtazapine 45 MG tablet Commonly known as: REMERON Take 45 mg by mouth at bedtime.   montelukast 10 MG  tablet Commonly known as: SINGULAIR Take 10 mg by mouth daily.   multivitamin with minerals Tabs tablet Take 1 tablet by mouth daily.   nitroGLYCERIN 0.4 MG SL tablet Commonly known as: NITROSTAT Place 1 tablet (0.4 mg total) under the tongue every 5 (five) minutes as needed for chest pain.   pantoprazole 40 MG tablet Commonly known as: PROTONIX TAKE 1 TABLET BY MOUTH TWICE DAILY. TAKE 30 MINS PRIOR TO MEALS        Follow-up Information     [facility] Schedule an appointment as soon as possible for a visit in 1 week(s).   Why: Hospital Follow Up Contact information: [address] [place] [address] [phone]         Satira Sark, MD .   Specialty: Cardiology Contact information: [place] [address] [phone]         Aviva Signs, MD. Schedule an appointment as soon as possible for a visit in  2 week(s).   Specialty: General Surgery Why: Hospital Follow Up regarding gallbladder Contact information: [address] [place] [address] [phone]         [facility]. Schedule an appointment as soon as possible for a visit in 3 week(s).   Why: Hospital Follow Up elevated liver enzymes Contact information: [place] [address] [phone]               Allergies  Allergen Reactions   Sulfa Antibiotics Nausea And Vomiting   Allergies as of 11/02/2020       Reactions   Sulfa Antibiotics Nausea And Vomiting        Medication List     TAKE these medications    amLODipine 10 MG tablet Commonly known as: NORVASC Take 10 mg by mouth daily.   aspirin 81 MG chewable tablet Chew 81 mg by mouth daily.   atorvastatin 40 MG tablet Commonly known as: LIPITOR Take 1 tablet by mouth daily.   busPIRone 15 MG tablet Commonly known as: BUSPAR Take 15 mg by mouth 3 (three) times daily. Take 1/2 tablet daily   cefdinir 300  MG capsule Commonly known as: OMNICEF Take 2 capsules (600 mg total) by mouth daily for 3 days.   cephALEXin 250 MG capsule Commonly known as: KEFLEX Take 1 capsule (250 mg total) by mouth daily at 8 pm. Start taking on: November 07, 2020 What changed: These instructions start on November 07, 2020. If you are unsure what to do until then, ask your doctor or other care provider.   glipiZIDE 5 MG tablet Commonly known as: GLUCOTROL Take 5 mg by mouth 2 (two) times daily before a meal.   lisinopril 10 MG tablet Commonly known as: ZESTRIL Take 10 mg by mouth daily.   metFORMIN 500 MG tablet Commonly known as: GLUCOPHAGE Take 500 mg by mouth 2 (two) times daily with a meal.   metoprolol succinate 50 MG 24 hr tablet Commonly known as: Toprol XL Take 1 tablet (50 mg total) by mouth every evening. Take with or immediately following a meal.   metoprolol succinate 100 MG 24 hr tablet Commonly known as: TOPROL-XL Take 1 tablet (100 mg total) by mouth in the morning. Take with or immediately following a meal.   mirtazapine 45 MG tablet Commonly known as: REMERON Take 45 mg by mouth at bedtime.   montelukast 10 MG tablet Commonly known as: SINGULAIR Take 10 mg by mouth daily.   multivitamin with minerals Tabs tablet Take 1 tablet by mouth daily.   nitroGLYCERIN 0.4 MG SL tablet Commonly known as: NITROSTAT Place 1 tablet (0.4 mg total) under the tongue every 5 (five) minutes as needed for chest pain.   pantoprazole 40 MG tablet Commonly known as: PROTONIX TAKE 1 TABLET BY MOUTH TWICE DAILY. TAKE 30 MINS PRIOR TO MEALS        Procedures/Studies: DG Chest 2 View  Result Date: 11/01/2020 CLINICAL DATA:  80 year old female, follow-up abnormal chest radiograph EXAM: CHEST - 2 VIEW COMPARISON:  11/01/2020 FINDINGS: The mediastinal contours are within normal limits. No cardiomegaly. Atherosclerotic calcification of the aortic arch. Previously visualized radiopaque abnormality in  the right upper lobe is no longer present, likely related to scapular overlap on the prior radiograph. The lungs are clear bilaterally without evidence of focal consolidation, pleural effusion, or pneumothorax. No acute osseous abnormality. IMPRESSION: No acute cardiopulmonary process. Resolution of previously visualized abnormal finding in the right upper lobe with improved positioning.  Aortic Atherosclerosis (ICD10-I70.0). Electronically Signed   By: Ruthann Cancer M.D.   On: 11/01/2020 11:59   CT ABDOMEN PELVIS W CONTRAST  Result Date: 11/01/2020 CLINICAL DATA:  Abdominal pain, acute, nonlocalized. Upper abdominal pain, vomiting, and fever for 3 days. EXAM: CT ABDOMEN AND PELVIS WITH CONTRAST TECHNIQUE: Multidetector CT imaging of the abdomen and pelvis was performed using the standard protocol following bolus administration of intravenous contrast. CONTRAST:  85m OMNIPAQUE IOHEXOL 350 MG/ML SOLN COMPARISON:  Right upper quadrant abdominal ultrasound 11/01/2020. CT abdomen and pelvis 03/28/2012. FINDINGS: Lower chest: Mild atelectasis in the lung bases. No pleural effusion. Coronary atherosclerosis and mild cardiomegaly. Hepatobiliary: No focal liver abnormality is seen. The gallbladder is mildly to moderately distended with slight wall thickening and slight haziness in the surrounding fat, and there is a 4 mm stone in the region of the gallbladder neck. No biliary dilatation. Pancreas: Unremarkable. Spleen: Subcentimeter hypodensity in the posterior aspect of the spleen, too small to fully characterize. Mild splenomegaly. Adrenals/Urinary Tract: Unremarkable adrenal glands. Slight enlargement of a 2.1 cm cyst in the lower pole of the right kidney compared to the prior CT with a small amount of layering milk of calcium noted. Additional subcentimeter hypodensities in both kidneys, too small to fully characterize. Unremarkable bladder. Stomach/Bowel: The stomach is unremarkable. There is no evidence of bowel  obstruction. Left-sided colonic diverticulosis is noted without evidence of acute diverticulitis. History of appendectomy. Vascular/Lymphatic: Abdominal aortic atherosclerosis without aneurysm. No enlarged lymph nodes. Reproductive: Status post hysterectomy. No adnexal masses. Other: No intraperitoneal free fluid. Musculoskeletal: No suspicious osseous lesion. Unchanged sclerosis in the posterior right ilium. Mild lumbar levoscoliosis. Advanced lower lumbar facet arthrosis with grade 1 anterolisthesis of L4 on L5. IMPRESSION: 1. Small stone in the gallbladder neck with finding suspicious for early acute cholecystitis. 2. Colonic diverticulosis without evidence of diverticulitis. 3. Aortic Atherosclerosis (ICD10-I70.0). Electronically Signed   By: ALogan BoresM.D.   On: 11/01/2020 11:25   MR 3D Recon At Scanner  Result Date: 11/01/2020 CLINICAL DATA:  Jaundice, abdominal pain and fevers. EXAM: MRI ABDOMEN WITHOUT AND WITH CONTRAST (INCLUDING MRCP) TECHNIQUE: Multiplanar multisequence MR imaging of the abdomen was performed both before and after the administration of intravenous contrast. Heavily T2-weighted images of the biliary and pancreatic ducts were obtained, and three-dimensional MRCP images were rendered by post processing. CONTRAST:  752mGADAVIST GADOBUTROL 1 MMOL/ML IV SOLN COMPARISON:  CT 11/01/2020 FINDINGS: Lower chest:  Lung bases are clear. Hepatobiliary: No intrahepatic biliary duct dilatation. The common hepatic duct and common bile duct are normal caliber. There is no gallstones evident within lumen the gallbladder. Small stone within the neck of the gallbladder/cystic duct seen on comparison CT is not readily placed on MRI imaging. Calcifications can be difficult to detect on MRI. There are no clear secondary signs of acute cholecystitis. No pericholecystic fluid or gallbladder distension. Pancreas: Normal pancreatic parenchymal intensity. No ductal dilatation or inflammation. Spleen: Normal  spleen. Adrenals/urinary tract: Adrenal glands and kidneys are normal. Stomach/Bowel: Stomach and limited of the small bowel is unremarkable Vascular/Lymphatic: Abdominal aortic normal caliber. No retroperitoneal periportal lymphadenopathy. Musculoskeletal: No aggressive osseous lesion IMPRESSION: 1. No choledocholithiasis.  No biliary dilatation. 2. No gallstones evident. The stone identified within the gallbladder neck/cystic duct on comparison CT is not readily place. 3. No clear signs of acute cholecystitis. Electronically Signed   By: StSuzy Bouchard.D.   On: 11/01/2020 14:18   DG Chest Port 1 View  Result Date: 11/01/2020 CLINICAL DATA:  Vomiting, upper abdominal pain, fever EXAM: PORTABLE CHEST 1 VIEW COMPARISON:  Chest radiograph 12/04/2019 FINDINGS: The cardiomediastinal silhouette is stable, with unchanged calcified atherosclerotic plaque of the aortic arch. Lung volumes are low, with unchanged asymmetric elevation of the right hemidiaphragm. There is a 1.6 cm nodular opacity projecting over the right upper lobe. There is no other focal airspace disease. There is no pleural effusion. There is no pneumothorax. There is no acute osseous abnormality. IMPRESSION: 1. No radiographic evidence of acute cardiopulmonary process. 2. 1.6 cm nodular opacity projecting over the right upper lobe may be artifactual. Recommend PA and lateral chest radiograph with good inspiratory effort or nonemergent chest CT to exclude a parenchymal nodule. Electronically Signed   By: Valetta Mole M.D.   On: 11/01/2020 10:46   MR ABDOMEN MRCP W WO CONTAST  Result Date: 11/01/2020 CLINICAL DATA:  Jaundice, abdominal pain and fevers. EXAM: MRI ABDOMEN WITHOUT AND WITH CONTRAST (INCLUDING MRCP) TECHNIQUE: Multiplanar multisequence MR imaging of the abdomen was performed both before and after the administration of intravenous contrast. Heavily T2-weighted images of the biliary and pancreatic ducts were obtained, and  three-dimensional MRCP images were rendered by post processing. CONTRAST:  29m GADAVIST GADOBUTROL 1 MMOL/ML IV SOLN COMPARISON:  CT 11/01/2020 FINDINGS: Lower chest:  Lung bases are clear. Hepatobiliary: No intrahepatic biliary duct dilatation. The common hepatic duct and common bile duct are normal caliber. There is no gallstones evident within lumen the gallbladder. Small stone within the neck of the gallbladder/cystic duct seen on comparison CT is not readily placed on MRI imaging. Calcifications can be difficult to detect on MRI. There are no clear secondary signs of acute cholecystitis. No pericholecystic fluid or gallbladder distension. Pancreas: Normal pancreatic parenchymal intensity. No ductal dilatation or inflammation. Spleen: Normal spleen. Adrenals/urinary tract: Adrenal glands and kidneys are normal. Stomach/Bowel: Stomach and limited of the small bowel is unremarkable Vascular/Lymphatic: Abdominal aortic normal caliber. No retroperitoneal periportal lymphadenopathy. Musculoskeletal: No aggressive osseous lesion IMPRESSION: 1. No choledocholithiasis.  No biliary dilatation. 2. No gallstones evident. The stone identified within the gallbladder neck/cystic duct on comparison CT is not readily place. 3. No clear signs of acute cholecystitis. Electronically Signed   By: SSuzy BouchardM.D.   On: 11/01/2020 14:18   UKoreaAbdomen Limited RUQ (LIVER/GB)  Result Date: 11/01/2020 CLINICAL DATA:  80year old female with right upper quadrant pain. EXAM: ULTRASOUND ABDOMEN LIMITED RIGHT UPPER QUADRANT COMPARISON:  01/22/2012 FINDINGS: Gallbladder: No gallstones, pericholecystic fluid, or gallbladder wall thickening visualized. No sonographic Murphy sign noted by sonographer. Common bile duct: Diameter: 2.3 mm Liver: No focal lesion identified. Mild diffuse increased echogenicity. Smooth echotexture. Portal vein is patent on color Doppler imaging with normal direction of blood flow towards the liver. Other: No  perihepatic ascites. IMPRESSION: 1. Similar-appearing mild hepatic steatosis. 2. No acute sonographic abnormality in the right upper quadrant. Electronically Signed   By: DRuthann CancerM.D.   On: 11/01/2020 09:29     Subjective: Pt tolerating diet, no abdominal pain, having regular bowel movements, no further dysuria, wants to go home.   Discharge Exam: Vitals:   11/02/20 0612 11/02/20 0925  BP: (!) 163/64 (!) 142/97  Pulse: 72   Resp:    Temp: 98.4 F (36.9 C)   SpO2: 91%    Vitals:   11/01/20 1925 11/01/20 1954 11/02/20 0612 11/02/20 0925  BP:  (!) 147/55 (!) 163/64 (!) 142/97  Pulse:  71 72   Resp:  19    Temp: 99.7 F (37.6  C) 98.7 F (37.1 C) 98.4 F (36.9 C)   TempSrc: Oral     SpO2:  95% 91%   Weight:  68 kg    Height:  5' 4"  (1.626 m)     General: Pt is alert, awake, not in acute distress Cardiovascular: RRR, S1/S2 +, no rubs, no gallops Respiratory: CTA bilaterally, no wheezing, no rhonchi Abdominal: Soft, NT, ND, bowel sounds + Extremities: no edema, no cyanosis   The results of significant diagnostics from this hospitalization (including imaging, microbiology, ancillary and laboratory) are listed below for reference.     Microbiology: Recent Results (from the past 240 hour(s))  Resp Panel by RT-PCR (Flu A&B, Covid) Nasopharyngeal Swab     Status: None   Collection Time: 11/01/20  8:22 AM   Specimen: Nasopharyngeal Swab; Nasopharyngeal(NP) swabs in vial transport medium  Result Value Ref Range Status   SARS Coronavirus 2 by RT PCR NEGATIVE NEGATIVE Final    Comment: (NOTE) SARS-CoV-2 target nucleic acids are NOT DETECTED.  The SARS-CoV-2 RNA is generally detectable in upper respiratory specimens during the acute phase of infection. The lowest concentration of SARS-CoV-2 viral copies this assay can detect is 138 copies/mL. A negative result does not preclude SARS-Cov-2 infection and should not be used as the sole basis for treatment or other patient  management decisions. A negative result may occur with  improper specimen collection/handling, submission of specimen other than nasopharyngeal swab, presence of viral mutation(s) within the areas targeted by this assay, and inadequate number of viral copies(<138 copies/mL). A negative result must be combined with clinical observations, patient history, and epidemiological information. The expected result is Negative.  Fact Sheet for Patients:  EntrepreneurPulse.com.au  Fact Sheet for Healthcare Providers:  IncredibleEmployment.be  This test is no t yet approved or cleared by the [place] FDA and  has been authorized for detection and/or diagnosis of SARS-CoV-2 by FDA under an Emergency Use Authorization (EUA). This EUA will remain  in effect (meaning this test can be used) for the duration of the COVID-19 declaration under Section 564(b)(1) of the Act, 21 U.S.C.section 360bbb-3(b)(1), unless the authorization is terminated  or revoked sooner.       Influenza A by PCR NEGATIVE NEGATIVE Final   Influenza B by PCR NEGATIVE NEGATIVE Final    Comment: (NOTE) The Xpert Xpress SARS-CoV-2/FLU/RSV plus assay is intended as an aid in the diagnosis of influenza from Nasopharyngeal swab specimens and should not be used as a sole basis for treatment. Nasal washings and aspirates are unacceptable for Xpert Xpress SARS-CoV-2/FLU/RSV testing.  Fact Sheet for Patients: EntrepreneurPulse.com.au  Fact Sheet for Healthcare Providers: IncredibleEmployment.be  This test is not yet approved or cleared by the [place] FDA and has been authorized for detection and/or diagnosis of SARS-CoV-2 by FDA under an Emergency Use Authorization (EUA). This EUA will remain in effect (meaning this test can be used) for the duration of the COVID-19 declaration under Section 564(b)(1) of the Act, 21 U.S.C. section 360bbb-3(b)(1),  unless the authorization is terminated or revoked.  Performed at [facility], [address], [place], [place] [address]   Culture, blood (routine x 2)     Status: None (Preliminary result)   Collection Time: 11/01/20  2:31 PM   Specimen: BLOOD LEFT HAND  Result Value Ref Range Status   Specimen Description BLOOD LEFT HAND  Final   Special Requests   Final    Blood Culture adequate volume BOTTLES DRAWN AEROBIC AND ANAEROBIC   Culture  Final    NO GROWTH < 24 HOURS Performed at [facility], [address], [place], [place] [address]    Report Status PENDING  Incomplete  Culture, blood (routine x 2)     Status: None (Preliminary result)   Collection Time: 11/01/20  2:31 PM   Specimen: BLOOD RIGHT HAND  Result Value Ref Range Status   Specimen Description BLOOD RIGHT HAND  Final   Special Requests   Final    Blood Culture adequate volume BOTTLES DRAWN AEROBIC AND ANAEROBIC   Culture   Final    NO GROWTH < 24 HOURS Performed at [facility], [address], [place],  [address]    Report Status PENDING  Incomplete     Labs: BNP (last 3 results) Recent Labs    11/01/20 0830  BNP 970.2*   Basic Metabolic Panel: Recent Labs  Lab 10/30/20 1518 11/01/20 0827 11/01/20 1431 11/02/20 0431  NA 140 138  --  143  K 3.9 3.3*  --  3.7  CL 106 103  --  107  CO2 23 25  --  26  GLUCOSE 166* 202*  --  101*  BUN 20 28*  --  14  CREATININE 1.08* 1.18*  --  0.69  CALCIUM 9.0 8.2*  --  8.5*  MG  --   --  1.9 2.0   Liver Function Tests: Recent Labs  Lab 10/30/20 1518 11/01/20 0827 11/02/20 0431  AST 298* 74* 57*  ALT 164* 108* 81*  ALKPHOS 209* 193* 187*  BILITOT 3.4* 3.2* 1.9*  PROT 7.7 6.4* 5.9*  ALBUMIN 4.1 3.3* 2.9*   Recent Labs  Lab 10/30/20 1518 11/01/20 0827 11/02/20 0431  LIPASE 37 30 27   No results for input(s): AMMONIA in the last 168 hours. CBC: Recent Labs  Lab 10/30/20 1518 11/01/20 0827 11/02/20 0431  WBC 18.2* 11.9* 6.9  NEUTROABS  --   8.9* 3.6  HGB 11.6* 10.2* 9.4*  HCT 39.4 34.5* 32.6*  MCV 78.2* 79.3* 80.3  PLT 243 215 178   Cardiac Enzymes: No results for input(s): CKTOTAL, CKMB, CKMBINDEX, TROPONINI in the last 168 hours. BNP: Invalid input(s): POCBNP CBG: Recent Labs  Lab 11/01/20 2054 11/02/20 0305 11/02/20 0731 11/02/20 1129  GLUCAP 123* 79 129* 161*   D-Dimer No results for input(s): DDIMER in the last 72 hours. Hgb A1c Recent Labs    11/01/20 1431  HGBA1C 7.4*   Lipid Profile No results for input(s): CHOL, HDL, LDLCALC, TRIG, CHOLHDL, LDLDIRECT in the last 72 hours. Thyroid function studies No results for input(s): TSH, T4TOTAL, T3FREE, THYROIDAB in the last 72 hours.  Invalid input(s): FREET3 Anemia work up No results for input(s): VITAMINB12, FOLATE, FERRITIN, TIBC, IRON, RETICCTPCT in the last 72 hours. Urinalysis    Component Value Date/Time   COLORURINE AMBER (A) 11/01/2020 0816   APPEARANCEUR HAZY (A) 11/01/2020 0816   LABSPEC 1.020 11/01/2020 0816   PHURINE 5.5 11/01/2020 0816   GLUCOSEU NEGATIVE 11/01/2020 0816   HGBUR NEGATIVE 11/01/2020 0816   BILIRUBINUR MODERATE (A) 11/01/2020 0816   KETONESUR NEGATIVE 11/01/2020 0816   PROTEINUR NEGATIVE 11/01/2020 0816   UROBILINOGEN 0.2 04/04/2012 1651   NITRITE POSITIVE (A) 11/01/2020 0816   LEUKOCYTESUR SMALL (A) 11/01/2020 0816   Sepsis [facility] Invalid input(s): PROCALCITONIN,  WBC,  LACTICIDVEN Microbiology Recent Results (from the past 240 hour(s))  Resp Panel by RT-PCR (Flu A&B, Covid) Nasopharyngeal Swab     Status: None   Collection Time: 11/01/20  8:22 AM  Specimen: Nasopharyngeal Swab; Nasopharyngeal(NP) swabs in vial transport medium  Result Value Ref Range Status   SARS Coronavirus 2 by RT PCR NEGATIVE NEGATIVE Final    Comment: (NOTE) SARS-CoV-2 target nucleic acids are NOT DETECTED.  The SARS-CoV-2 RNA is generally detectable in upper respiratory specimens during the acute phase of infection. The  lowest concentration of SARS-CoV-2 viral copies this assay can detect is 138 copies/mL. A negative result does not preclude SARS-Cov-2 infection and should not be used as the sole basis for treatment or other patient management decisions. A negative result may occur with  improper specimen collection/handling, submission of specimen other than nasopharyngeal swab, presence of viral mutation(s) within the areas targeted by this assay, and inadequate number of viral copies(<138 copies/mL). A negative result must be combined with clinical observations, patient history, and epidemiological information. The expected result is Negative.  Fact Sheet for Patients:  EntrepreneurPulse.com.au  Fact Sheet for Healthcare Providers:  IncredibleEmployment.be  This test is no t yet approved or cleared by the [place] FDA and  has been authorized for detection and/or diagnosis of SARS-CoV-2 by FDA under an Emergency Use Authorization (EUA). This EUA will remain  in effect (meaning this test can be used) for the duration of the COVID-19 declaration under Section 564(b)(1) of the Act, 21 U.S.C.section 360bbb-3(b)(1), unless the authorization is terminated  or revoked sooner.       Influenza A by PCR NEGATIVE NEGATIVE Final   Influenza B by PCR NEGATIVE NEGATIVE Final    Comment: (NOTE) The Xpert Xpress SARS-CoV-2/FLU/RSV plus assay is intended as an aid in the diagnosis of influenza from Nasopharyngeal swab specimens and should not be used as a sole basis for treatment. Nasal washings and aspirates are unacceptable for Xpert Xpress SARS-CoV-2/FLU/RSV testing.  Fact Sheet for Patients: EntrepreneurPulse.com.au  Fact Sheet for Healthcare Providers: IncredibleEmployment.be  This test is not yet approved or cleared by the [place] FDA and has been authorized for detection and/or diagnosis of SARS-CoV-2 by FDA under  an Emergency Use Authorization (EUA). This EUA will remain in effect (meaning this test can be used) for the duration of the COVID-19 declaration under Section 564(b)(1) of the Act, 21 U.S.C. section 360bbb-3(b)(1), unless the authorization is terminated or revoked.  Performed at [facility], [address] [place], [place] [address]   Culture, blood (routine x 2)     Status: None (Preliminary result)   Collection Time: 11/01/20  2:31 PM   Specimen: BLOOD LEFT HAND  Result Value Ref Range Status   Specimen Description BLOOD LEFT HAND  Final   Special Requests   Final    Blood Culture adequate volume BOTTLES DRAWN AEROBIC AND ANAEROBIC   Culture   Final    NO GROWTH < 24 HOURS Performed at [facility], [address], [place], [place] [address]    Report Status PENDING  Incomplete  Culture, blood (routine x 2)     Status: None (Preliminary result)   Collection Time: 11/01/20  2:31 PM   Specimen: BLOOD RIGHT HAND  Result Value Ref Range Status   Specimen Description BLOOD RIGHT HAND  Final   Special Requests   Final    Blood Culture adequate volume BOTTLES DRAWN AEROBIC AND ANAEROBIC   Culture   Final    NO GROWTH < 24 HOURS Performed at [facility], [address] [place], [place] [address]    Report Status PENDING  Incomplete   Time coordinating discharge:   SIGNED:  Irwin Brakeman,  MD  [facility] 11/02/2020, 12:11 PM How to contact the [facility] Attending or Consulting provider [place] or covering provider during after hours [place], for this patient?  Check the care team in [facility] and look for a) attending/consulting [facility] provider listed and b) the [facility] team listed Log into [email or web address] and use [facility]'s universal password to access. If you do not have the password, please contact the hospital operator. Locate the [facility] provider you are looking for under [facility] and page to a number that you can be directly reached. If you still have difficulty reaching the  provider, please page the [facility] (Director on Call) for the Hospitalists listed on amion for assistance.

## 2020-11-02 NOTE — Progress Notes (Signed)
Pt arrived to room 312.  Pt vital obtained. Pt is clean and assessed by admitting RN. Pt educated on using call light and using the phone to reach the nurse directly.  Daughter at bedside. Pt reports no pain unless she coughs, and is resting with call bell within reach.

## 2020-11-02 NOTE — Care Management Obs Status (Signed)
[place] NOTIFICATION   Patient Details  Name: GENEAN ADAMSKI MRN: [id number] Date of Birth: 1941/02/10   Medicare Observation Status Notification Given:  Yes    Natasha Bence, [place] 11/02/2020, 12:16 PM

## 2020-11-02 NOTE — Consult Note (Addendum)
Reason for Consult: Transaminitis, right upper quadrant abdominal pain Referring Physician: Dr. Gaston Islam Carmen [name] is an 80 y.o. female.  HPI: Patient is a 80 year old white female who presented to the emergency room twice over the past few days with worsening right upper quadrant abdominal pain.  She was noted in the emergency room on blood testing to have transaminitis.  She states she has had the upper abdominal pain with nausea over the past few days.  An ultrasound the gallbladder was negative.  CT scan of the abdomen revealed a possible small gallstone.  An MRCP was performed which was unremarkable.  The patient was admitted to the hospital for further evaluation and treatment.  She has been hungry.  She has denied any nausea or vomiting.  Her abdominal pain seems to have subsided.  Repeat liver test done this morning are normalizing.  She has not had an episode like this before.  Past Medical History:  Diagnosis Date   CAD (coronary artery disease)    DES to LAD 2009   Essential hypertension    GERD (gastroesophageal reflux disease)    Hyperlipidemia    Palpitations    Type 2 diabetes mellitus (HCC)     Past Surgical History:  Procedure Laterality Date   ABDOMINAL HYSTERECTOMY     APPENDECTOMY     CATARACT EXTRACTION Left    COLONOSCOPY N/A 04/03/2015   RMR:4 polyps/mild diverticulosis/small internal hemorrhoids   COLONOSCOPY WITH ESOPHAGOGASTRODUODENOSCOPY (EGD)  02/15/2012   DGL:OVFIE sessile polyps 3-8 mm found at the cecum/ 8 polyps measuring 3-8 mm in/1 polyp measuring 6 mm in transverse colon/3-56m polyps found in the sigmoid/6 mm polyp found in the rectum/small internal hemorrhoids   ESOPHAGEAL DILATION     In the 1990s   ESOPHAGEAL DILATION  02/15/2012   SLF: Stricture was found at the gastroesophageal/ Non-erosive gastritis (inflammation)Sessile polyp measuring 4 mm/duodenal mucosa showed no abnormalities    ESOPHAGOGASTRODUODENOSCOPY N/A 04/03/2015   RPPI:RJJOACZYS at the gastroesophageal junction/moderate nodular gastritis   SAVORY DILATION N/A 04/03/2015   Procedure: SAVORY DILATION;  Surgeon: SDanie Binder MD;  [facility]: [facility];  Service: Endoscopy;  Laterality: N/A;    Family History  Problem Relation Age of Onset   Heart attack Father    Kidney disease Mother    Diabetes Brother    Hypertension Brother    Stomach cancer Maternal Uncle    Throat cancer Paternal Uncle    Diabetes Brother    Hypertension Brother    Prostate cancer Maternal Uncle    Brain cancer Maternal Uncle    Colon cancer Neg Hx    Liver disease Neg Hx     Social History:  reports that she has never smoked. She has never used smokeless tobacco. She reports that she does not drink alcohol and does not use drugs.  Allergies:  Allergies  Allergen Reactions   Sulfa Antibiotics Nausea And Vomiting    Medications: I have reviewed the patient's current medications. Prior to Admission:  Medications Prior to Admission  Medication Sig Dispense Refill Last Dose   amLODipine (NORVASC) 10 MG tablet Take 10 mg by mouth daily.   10/31/2020   aspirin 81 MG chewable tablet Chew 81 mg by mouth daily.   10/31/2020 at 0800   atorvastatin (LIPITOR) 40 MG tablet Take 1 tablet by mouth daily.   11/01/2020   busPIRone (BUSPAR) 15 MG tablet Take 15 mg by mouth 3 (three) times daily. Take 1/2 tablet daily  10/31/2020   cephALEXin (KEFLEX) 250 MG capsule Take 250 mg by mouth daily at 8 pm.   10/31/2020   glipiZIDE (GLUCOTROL) 5 MG tablet Take 5 mg by mouth 2 (two) times daily before a meal.   10/31/2020   lisinopril (PRINIVIL,ZESTRIL) 10 MG tablet Take 10 mg by mouth daily.   10/31/2020   metFORMIN (GLUCOPHAGE) 500 MG tablet Take 500 mg by mouth 2 (two) times daily with a meal.   11/01/2020   metoprolol succinate (TOPROL XL) 50 MG 24 hr tablet Take 1 tablet (50 mg total) by mouth every evening. Take with or immediately following a meal. 90 tablet 3 10/31/2020 at 1800   metoprolol succinate  (TOPROL-XL) 100 MG 24 hr tablet Take 1 tablet (100 mg total) by mouth in the morning. Take with or immediately following a meal. 90 tablet 3 11/01/2020   mirtazapine (REMERON) 45 MG tablet Take 45 mg by mouth at bedtime.   10/31/2020   montelukast (SINGULAIR) 10 MG tablet Take 10 mg by mouth daily.   10/31/2020   Multiple Vitamin (MULTIVITAMIN WITH MINERALS) TABS tablet Take 1 tablet by mouth daily.   10/31/2020   nitroGLYCERIN (NITROSTAT) 0.4 MG SL tablet Place 1 tablet (0.4 mg total) under the tongue every 5 (five) minutes as needed for chest pain. 25 tablet 3    pantoprazole (PROTONIX) 40 MG tablet TAKE 1 TABLET BY MOUTH TWICE DAILY. TAKE 30 MINS PRIOR TO MEALS 60 tablet 5 10/31/2020    Results for orders placed or performed during the hospital encounter of 11/01/20 (from the past 48 hour(s))  Urinalysis, Routine w reflex microscopic Urine, Clean Catch     Status: Abnormal   Collection Time: 11/01/20  8:16 AM  Result Value Ref Range   Color, Urine AMBER (A) YELLOW    Comment: BIOCHEMICALS MAY BE AFFECTED BY COLOR   APPearance HAZY (A) CLEAR   Specific Gravity, Urine 1.020 1.005 - 1.030   pH 5.5 5.0 - 8.0   Glucose, UA NEGATIVE NEGATIVE mg/dL   Hgb urine dipstick NEGATIVE NEGATIVE   Bilirubin Urine MODERATE (A) NEGATIVE   Ketones, ur NEGATIVE NEGATIVE mg/dL   Protein, ur NEGATIVE NEGATIVE mg/dL   Nitrite POSITIVE (A) NEGATIVE   Leukocytes,Ua SMALL (A) NEGATIVE    Comment: Performed at [facility] [address] [place], [place] [address]  Urinalysis, Microscopic (reflex)     Status: Abnormal   Collection Time: 11/01/20  8:16 AM  Result Value Ref Range   RBC / HPF 0-5 0 - 5 RBC/hpf   WBC, UA 11-20 0 - 5 WBC/hpf   Bacteria, UA MANY (A) NONE SEEN   Squamous Epithelial / LPF 0-5 0 - 5   Mucus PRESENT    WBC Casts, UA PRESENT     Comment: Performed at [facility], [address], [place], [place] [address]  Resp Panel by RT-PCR (Flu A&B, Covid) Nasopharyngeal Swab     Status: None    Collection Time: 11/01/20  8:22 AM   Specimen: Nasopharyngeal Swab; Nasopharyngeal(NP) swabs in vial transport medium  Result Value Ref Range   SARS Coronavirus 2 by RT PCR NEGATIVE NEGATIVE    Comment: (NOTE) SARS-CoV-2 target nucleic acids are NOT DETECTED.  The SARS-CoV-2 RNA is generally detectable in upper respiratory specimens during the acute phase of infection. The lowest concentration of SARS-CoV-2 viral copies this assay can detect is 138 copies/mL. A negative result does not preclude SARS-Cov-2 infection and should not be used as the sole basis for  treatment or other patient management decisions. A negative result may occur with  improper specimen collection/handling, submission of specimen other than nasopharyngeal swab, presence of viral mutation(s) within the areas targeted by this assay, and inadequate number of viral copies(<138 copies/mL). A negative result must be combined with clinical observations, patient history, and epidemiological information. The expected result is Negative.  Fact Sheet for Patients:  EntrepreneurPulse.com.au  Fact Sheet for Healthcare Providers:  IncredibleEmployment.be  This test is no t yet approved or cleared by the [place] FDA and  has been authorized for detection and/or diagnosis of SARS-CoV-2 by FDA under an Emergency Use Authorization (EUA). This EUA will remain  in effect (meaning this test can be used) for the duration of the COVID-19 declaration under Section 564(b)(1) of the Act, 21 U.S.C.section 360bbb-3(b)(1), unless the authorization is terminated  or revoked sooner.       Influenza A by PCR NEGATIVE NEGATIVE   Influenza B by PCR NEGATIVE NEGATIVE    Comment: (NOTE) The Xpert Xpress SARS-CoV-2/FLU/RSV plus assay is intended as an aid in the diagnosis of influenza from Nasopharyngeal swab specimens and should not be used as a sole basis for treatment. Nasal washings  and aspirates are unacceptable for Xpert Xpress SARS-CoV-2/FLU/RSV testing.  Fact Sheet for Patients: EntrepreneurPulse.com.au  Fact Sheet for Healthcare Providers: IncredibleEmployment.be  This test is not yet approved or cleared by the [place] FDA and has been authorized for detection and/or diagnosis of SARS-CoV-2 by FDA under an Emergency Use Authorization (EUA). This EUA will remain in effect (meaning this test can be used) for the duration of the COVID-19 declaration under Section 564(b)(1) of the Act, 21 U.S.C. section 360bbb-3(b)(1), unless the authorization is terminated or revoked.  Performed at [facility], [address] [place], [place] [address]   CBC with Differential     Status: Abnormal   Collection Time: 11/01/20  8:27 AM  Result Value Ref Range   WBC 11.9 (H) 4.0 - 10.5 K/uL   RBC 4.35 3.87 - 5.11 MIL/uL   Hemoglobin 10.2 (L) 12.0 - 15.0 g/dL   HCT 34.5 (L) 36.0 - 46.0 %   MCV 79.3 (L) 80.0 - 100.0 fL   MCH 23.4 (L) 26.0 - 34.0 pg   MCHC 29.6 (L) 30.0 - 36.0 g/dL   RDW 18.1 (H) 11.5 - 15.5 %   Platelets 215 150 - 400 K/uL   nRBC 0.0 0.0 - 0.2 %   Neutrophils Relative % 76 %   Neutro Abs 8.9 (H) 1.7 - 7.7 K/uL   Lymphocytes Relative 17 %   Lymphs Abs 2.1 0.7 - 4.0 K/uL   Monocytes Relative 6 %   Monocytes Absolute 0.7 0.1 - 1.0 K/uL   Eosinophils Relative 1 %   Eosinophils Absolute 0.1 0.0 - 0.5 K/uL   Basophils Relative 0 %   Basophils Absolute 0.0 0.0 - 0.1 K/uL   Immature Granulocytes 0 %   Abs Immature Granulocytes 0.04 0.00 - 0.07 K/uL    Comment: Performed at [facility], [address], [place], [place] [address]  Comprehensive metabolic panel     Status: Abnormal   Collection Time: 11/01/20  8:27 AM  Result Value Ref Range   Sodium 138 135 - 145 mmol/L   Potassium 3.3 (L) 3.5 - 5.1 mmol/L   Chloride 103 98 - 111 mmol/L   CO2 25 22 - 32 mmol/L   Glucose, Bld 202 (H) 70 - 99 mg/dL    Comment:  Glucose reference  range applies only to samples taken after fasting for at least 8 hours.   BUN 28 (H) 8 - 23 mg/dL   Creatinine, Ser 1.18 (H) 0.44 - 1.00 mg/dL   Calcium 8.2 (L) 8.9 - 10.3 mg/dL   Total Protein 6.4 (L) 6.5 - 8.1 g/dL   Albumin 3.3 (L) 3.5 - 5.0 g/dL   AST 74 (H) 15 - 41 U/L   ALT 108 (H) 0 - 44 U/L   Alkaline Phosphatase 193 (H) 38 - 126 U/L   Total Bilirubin 3.2 (H) 0.3 - 1.2 mg/dL   GFR, Estimated 47 (L) >60 mL/min    Comment: (NOTE) Calculated using the CKD-EPI Creatinine Equation (2021)    Anion gap 10 5 - 15    Comment: Performed at [facility], [address], [place], [place] [address]  Lipase, blood     Status: None   Collection Time: 11/01/20  8:27 AM  Result Value Ref Range   Lipase 30 11 - 51 U/L    Comment: Performed at [facility], [address] [place], [place] [address]  Brain natriuretic peptide     Status: Abnormal   Collection Time: 11/01/20  8:30 AM  Result Value Ref Range   B Natriuretic Peptide 109.0 (H) 0.0 - 100.0 pg/mL    Comment: Performed at [facility], [address], [place], [place] [address]  Culture, blood (routine x 2)     Status: None (Preliminary result)   Collection Time: 11/01/20  2:31 PM   Specimen: BLOOD LEFT HAND  Result Value Ref Range   Specimen Description BLOOD LEFT HAND    Special Requests      Blood Culture adequate volume BOTTLES DRAWN AEROBIC AND ANAEROBIC   Culture      NO GROWTH < 24 HOURS Performed at [facility], [address] [place], [place] [address]    Report Status PENDING   Culture, blood (routine x 2)     Status: None (Preliminary result)   Collection Time: 11/01/20  2:31 PM   Specimen: BLOOD RIGHT HAND  Result Value Ref Range   Specimen Description BLOOD RIGHT HAND    Special Requests      Blood Culture adequate volume BOTTLES DRAWN AEROBIC AND ANAEROBIC   Culture      NO GROWTH < 24 HOURS Performed at [facility], [address], [place], [place] [address]    Report Status PENDING    Hemoglobin A1c     Status: Abnormal   Collection Time: 11/01/20  2:31 PM  Result Value Ref Range   Hgb A1c MFr Bld 7.4 (H) 4.8 - 5.6 %    Comment: (NOTE) Pre diabetes:          5.7%-6.4%  Diabetes:              >6.4%  Glycemic control for   <7.0% adults with diabetes    Mean Plasma Glucose 165.68 mg/dL    Comment: Performed at [facility], [place] [address] [place], [place] [address]  Magnesium     Status: None   Collection Time: 11/01/20  2:31 PM  Result Value Ref Range   Magnesium 1.9 1.7 - 2.4 mg/dL    Comment: Performed at [facility], [address] [place], [place] [address]  Glucose, capillary     Status: Abnormal   Collection Time: 11/01/20  8:54 PM  Result Value Ref Range   Glucose-Capillary 123 (H) 70 - 99 mg/dL    Comment: Glucose reference  range applies only to samples taken after fasting for at least 8 hours.  Glucose, capillary     Status: None   Collection Time: 11/02/20  3:05 AM  Result Value Ref Range   Glucose-Capillary 79 70 - 99 mg/dL    Comment: Glucose reference range applies only to samples taken after fasting for at least 8 hours.  Comprehensive metabolic panel     Status: Abnormal   Collection Time: 11/02/20  4:31 AM  Result Value Ref Range   Sodium 143 135 - 145 mmol/L   Potassium 3.7 3.5 - 5.1 mmol/L   Chloride 107 98 - 111 mmol/L   CO2 26 22 - 32 mmol/L   Glucose, Bld 101 (H) 70 - 99 mg/dL    Comment: Glucose reference range applies only to samples taken after fasting for at least 8 hours.   BUN 14 8 - 23 mg/dL   Creatinine, Ser 0.69 0.44 - 1.00 mg/dL   Calcium 8.5 (L) 8.9 - 10.3 mg/dL   Total Protein 5.9 (L) 6.5 - 8.1 g/dL   Albumin 2.9 (L) 3.5 - 5.0 g/dL   AST 57 (H) 15 - 41 U/L   ALT 81 (H) 0 - 44 U/L   Alkaline Phosphatase 187 (H) 38 - 126 U/L   Total Bilirubin 1.9 (H) 0.3 - 1.2 mg/dL   GFR, Estimated >60 >60 mL/min    Comment: (NOTE) Calculated using the CKD-EPI Creatinine Equation (2021)    Anion gap 10 5 - 15    Comment:  Performed at [facility] [address] [place], [place] [address]  Magnesium     Status: None   Collection Time: 11/02/20  4:31 AM  Result Value Ref Range   Magnesium 2.0 1.7 - 2.4 mg/dL    Comment: Performed at [facility], [address], [place], [place] [address]  CBC WITH DIFFERENTIAL     Status: Abnormal   Collection Time: 11/02/20  4:31 AM  Result Value Ref Range   WBC 6.9 4.0 - 10.5 K/uL   RBC 4.06 3.87 - 5.11 MIL/uL   Hemoglobin 9.4 (L) 12.0 - 15.0 g/dL   HCT 32.6 (L) 36.0 - 46.0 %   MCV 80.3 80.0 - 100.0 fL   MCH 23.2 (L) 26.0 - 34.0 pg   MCHC 28.8 (L) 30.0 - 36.0 g/dL   RDW 18.0 (H) 11.5 - 15.5 %   Platelets 178 150 - 400 K/uL   nRBC 0.0 0.0 - 0.2 %   Neutrophils Relative % 52 %   Neutro Abs 3.6 1.7 - 7.7 K/uL   Lymphocytes Relative 38 %   Lymphs Abs 2.6 0.7 - 4.0 K/uL   Monocytes Relative 8 %   Monocytes Absolute 0.5 0.1 - 1.0 K/uL   Eosinophils Relative 1 %   Eosinophils Absolute 0.1 0.0 - 0.5 K/uL   Basophils Relative 1 %   Basophils Absolute 0.0 0.0 - 0.1 K/uL   Immature Granulocytes 0 %   Abs Immature Granulocytes 0.03 0.00 - 0.07 K/uL    Comment: Performed at [facility], [address], [place], [place] [address]  Lipase, blood     Status: None   Collection Time: 11/02/20  4:31 AM  Result Value Ref Range   Lipase 27 11 - 51 U/L    Comment: Performed at [facility], [address] [place], [place] [address]  Glucose, capillary     Status: Abnormal   Collection Time: 11/02/20  7:31 AM  Result Value Ref Range  Glucose-Capillary 129 (H) 70 - 99 mg/dL    Comment: Glucose reference range applies only to samples taken after fasting for at least 8 hours.    DG Chest 2 View  Result Date: 11/01/2020 CLINICAL DATA:  80 year old female, follow-up abnormal chest radiograph EXAM: CHEST - 2 VIEW COMPARISON:  11/01/2020 FINDINGS: The mediastinal contours are within normal limits. No cardiomegaly. Atherosclerotic calcification of the aortic arch. Previously  visualized radiopaque abnormality in the right upper lobe is no longer present, likely related to scapular overlap on the prior radiograph. The lungs are clear bilaterally without evidence of focal consolidation, pleural effusion, or pneumothorax. No acute osseous abnormality. IMPRESSION: No acute cardiopulmonary process. Resolution of previously visualized abnormal finding in the right upper lobe with improved positioning. Aortic Atherosclerosis (ICD10-I70.0). Electronically Signed   By: Ruthann Cancer M.D.   On: 11/01/2020 11:59   CT ABDOMEN PELVIS W CONTRAST  Result Date: 11/01/2020 CLINICAL DATA:  Abdominal pain, acute, nonlocalized. Upper abdominal pain, vomiting, and fever for 3 days. EXAM: CT ABDOMEN AND PELVIS WITH CONTRAST TECHNIQUE: Multidetector CT imaging of the abdomen and pelvis was performed using the standard protocol following bolus administration of intravenous contrast. CONTRAST:  82m OMNIPAQUE IOHEXOL 350 MG/ML SOLN COMPARISON:  Right upper quadrant abdominal ultrasound 11/01/2020. CT abdomen and pelvis 03/28/2012. FINDINGS: Lower chest: Mild atelectasis in the lung bases. No pleural effusion. Coronary atherosclerosis and mild cardiomegaly. Hepatobiliary: No focal liver abnormality is seen. The gallbladder is mildly to moderately distended with slight wall thickening and slight haziness in the surrounding fat, and there is a 4 mm stone in the region of the gallbladder neck. No biliary dilatation. Pancreas: Unremarkable. Spleen: Subcentimeter hypodensity in the posterior aspect of the spleen, too small to fully characterize. Mild splenomegaly. Adrenals/Urinary Tract: Unremarkable adrenal glands. Slight enlargement of a 2.1 cm cyst in the lower pole of the right kidney compared to the prior CT with a small amount of layering milk of calcium noted. Additional subcentimeter hypodensities in both kidneys, too small to fully characterize. Unremarkable bladder. Stomach/Bowel: The stomach is  unremarkable. There is no evidence of bowel obstruction. Left-sided colonic diverticulosis is noted without evidence of acute diverticulitis. History of appendectomy. Vascular/Lymphatic: Abdominal aortic atherosclerosis without aneurysm. No enlarged lymph nodes. Reproductive: Status post hysterectomy. No adnexal masses. Other: No intraperitoneal free fluid. Musculoskeletal: No suspicious osseous lesion. Unchanged sclerosis in the posterior right ilium. Mild lumbar levoscoliosis. Advanced lower lumbar facet arthrosis with grade 1 anterolisthesis of L4 on L5. IMPRESSION: 1. Small stone in the gallbladder neck with finding suspicious for early acute cholecystitis. 2. Colonic diverticulosis without evidence of diverticulitis. 3. Aortic Atherosclerosis (ICD10-I70.0). Electronically Signed   By: ALogan BoresM.D.   On: 11/01/2020 11:25   MR 3D Recon At Scanner  Result Date: 11/01/2020 CLINICAL DATA:  Jaundice, abdominal pain and fevers. EXAM: MRI ABDOMEN WITHOUT AND WITH CONTRAST (INCLUDING MRCP) TECHNIQUE: Multiplanar multisequence MR imaging of the abdomen was performed both before and after the administration of intravenous contrast. Heavily T2-weighted images of the biliary and pancreatic ducts were obtained, and three-dimensional MRCP images were rendered by post processing. CONTRAST:  738mGADAVIST GADOBUTROL 1 MMOL/ML IV SOLN COMPARISON:  CT 11/01/2020 FINDINGS: Lower chest:  Lung bases are clear. Hepatobiliary: No intrahepatic biliary duct dilatation. The common hepatic duct and common bile duct are normal caliber. There is no gallstones evident within lumen the gallbladder. Small stone within the neck of the gallbladder/cystic duct seen on comparison CT is not readily placed on MRI imaging. Calcifications  can be difficult to detect on MRI. There are no clear secondary [name] of acute cholecystitis. No pericholecystic fluid or gallbladder distension. Pancreas: Normal pancreatic parenchymal intensity. No ductal  dilatation or inflammation. Spleen: Normal spleen. Adrenals/urinary tract: Adrenal glands and kidneys are normal. Stomach/Bowel: Stomach and limited of the small bowel is unremarkable Vascular/Lymphatic: Abdominal aortic normal caliber. No retroperitoneal periportal lymphadenopathy. Musculoskeletal: No aggressive osseous lesion IMPRESSION: 1. No choledocholithiasis.  No biliary dilatation. 2. No gallstones evident. The stone identified within the gallbladder neck/cystic duct on comparison CT is not readily place. 3. No clear [name] of acute cholecystitis. Electronically Signed   By: Suzy Bouchard M.D.   On: 11/01/2020 14:18   DG Chest Port 1 View  Result Date: 11/01/2020 CLINICAL DATA:  Vomiting, upper abdominal pain, fever EXAM: PORTABLE CHEST 1 VIEW COMPARISON:  Chest radiograph 12/04/2019 FINDINGS: The cardiomediastinal silhouette is stable, with unchanged calcified atherosclerotic plaque of the aortic arch. Lung volumes are low, with unchanged asymmetric elevation of the right hemidiaphragm. There is a 1.6 cm nodular opacity projecting over the right upper lobe. There is no other focal airspace disease. There is no pleural effusion. There is no pneumothorax. There is no acute osseous abnormality. IMPRESSION: 1. No radiographic evidence of acute cardiopulmonary process. 2. 1.6 cm nodular opacity projecting over the right upper lobe may be artifactual. Recommend PA and lateral chest radiograph with good inspiratory effort or nonemergent chest CT to exclude a parenchymal nodule. Electronically Signed   By: Valetta Mole M.D.   On: 11/01/2020 10:46   MR ABDOMEN MRCP W WO CONTAST  Result Date: 11/01/2020 CLINICAL DATA:  Jaundice, abdominal pain and fevers. EXAM: MRI ABDOMEN WITHOUT AND WITH CONTRAST (INCLUDING MRCP) TECHNIQUE: Multiplanar multisequence MR imaging of the abdomen was performed both before and after the administration of intravenous contrast. Heavily T2-weighted images of the biliary and  pancreatic ducts were obtained, and three-dimensional MRCP images were rendered by post processing. CONTRAST:  59m GADAVIST GADOBUTROL 1 MMOL/ML IV SOLN COMPARISON:  CT 11/01/2020 FINDINGS: Lower chest:  Lung bases are clear. Hepatobiliary: No intrahepatic biliary duct dilatation. The common hepatic duct and common bile duct are normal caliber. There is no gallstones evident within lumen the gallbladder. Small stone within the neck of the gallbladder/cystic duct seen on comparison CT is not readily placed on MRI imaging. Calcifications can be difficult to detect on MRI. There are no clear secondary [name] of acute cholecystitis. No pericholecystic fluid or gallbladder distension. Pancreas: Normal pancreatic parenchymal intensity. No ductal dilatation or inflammation. Spleen: Normal spleen. Adrenals/urinary tract: Adrenal glands and kidneys are normal. Stomach/Bowel: Stomach and limited of the small bowel is unremarkable Vascular/Lymphatic: Abdominal aortic normal caliber. No retroperitoneal periportal lymphadenopathy. Musculoskeletal: No aggressive osseous lesion IMPRESSION: 1. No choledocholithiasis.  No biliary dilatation. 2. No gallstones evident. The stone identified within the gallbladder neck/cystic duct on comparison CT is not readily place. 3. No clear [name] of acute cholecystitis. Electronically Signed   By: SSuzy BouchardM.D.   On: 11/01/2020 14:18   UKoreaAbdomen Limited RUQ (LIVER/GB)  Result Date: 11/01/2020 CLINICAL DATA:  80year old female with right upper quadrant pain. EXAM: ULTRASOUND ABDOMEN LIMITED RIGHT UPPER QUADRANT COMPARISON:  01/22/2012 FINDINGS: Gallbladder: No gallstones, pericholecystic fluid, or gallbladder wall thickening visualized. No sonographic Murphy sign noted by sonographer. Common bile duct: Diameter: 2.3 mm Liver: No focal lesion identified. Mild diffuse increased echogenicity. Smooth echotexture. Portal vein is patent on color Doppler imaging with normal direction of blood  flow towards the liver. Other:  No perihepatic ascites. IMPRESSION: 1. Similar-appearing mild hepatic steatosis. 2. No acute sonographic abnormality in the right upper quadrant. Electronically Signed   By: Ruthann Cancer M.D.   On: 11/01/2020 09:29    ROS:  Pertinent items are noted in HPI.  Blood pressure (!) 163/64, pulse 72, temperature 98.4 F (36.9 C), resp. rate 19, height 5' 4"  (1.626 m), weight 68 kg, SpO2 91 %. Physical Exam: Pleasant white female in no acute distress Head is normocephalic, atraumatic Lungs clear to auscultation with good breath sounds bilaterally Heart examination reveals regular rate and rhythm without S3, S4, murmurs Abdomen is soft, nontender, nondistended.  Assessment/Plan: Impression: Right upper quadrant abdominal pain with transaminitis, resolving.  Given that most of the testing reveals no cholelithiasis, I would not recommend cholecystectomy at this time.  She may have passed a gallstone.  No evidence of pancreatitis.  A GI consultation is pending.  Should she have recurrent episodes, an EUS may be indicated.  This was explained to the patient and daughter.  May resume normal diet.  Carmen [name] 11/02/2020, 8:46 AM

## 2020-11-02 NOTE — Progress Notes (Signed)
Physical Therapy Discharge Patient Details Name: Carmen [name] MRN: [id number] DOB: [birth year] Today's Date: 11/02/2020 Time:  -     Patient discharged from PT services secondary to goals met and no further PT needs identified.  Therapist entered room.  PT stood up, combed hair at mirror and went to the restroom by herself.  Stated she did not feel she needed therapy, therapist agrees      Rayetta Humphrey, PT CLT [phone]  11/02/2020, 11:35 AM

## 2020-11-06 LAB — CULTURE, BLOOD (ROUTINE X 2)
Culture: NO GROWTH
Culture: NO GROWTH
Special Requests: ADEQUATE
Special Requests: ADEQUATE

## 2020-11-08 ENCOUNTER — Ambulatory Visit: Payer: Medicare HMO | Admitting: Cardiology

## 2020-11-26 ENCOUNTER — Other Ambulatory Visit: Payer: Self-pay | Admitting: Family Medicine

## 2021-04-02 NOTE — Progress Notes (Signed)
Cardiology Office Note    Date:  04/14/2021   ID:  RHAPSODY WOLVEN, DOB 1940/09/09, MRN [id number]   PCP:  [facility]  Cardiologist:  Rozann Lesches, MD   Advanced Practice Provider:  No care team member to display Electrophysiologist:  None   [id number]}   No chief complaint on file.   History of Present Illness:  Carmen [name] is a 81 y.o. female with history of CAD status post DES to the LAD 2009, hypertension, HLD, palpitations with SVT on monitor in 2020 on Toprol.  Patient saw Dr. Domenic Polite 05/02/2020 and was doing well on Toprol-XL 100 mg in the morning 50 in the afternoon.   Patient comes in for f/u. Her brother died 05/30/22 who she visited daily. Very upset, was having some fluttering at night but better since he died. She walks some and is going to go back to senior dances. Denies chest pain, dyspnea, edema, dizziness. Just had blood work at [facility] and was told blood work was good. A1C 7.1    Past Medical History:  Diagnosis Date   CAD (coronary artery disease)    DES to LAD 2009   Essential hypertension    GERD (gastroesophageal reflux disease)    Hyperlipidemia    Palpitations    Type 2 diabetes mellitus ([place])     Past Surgical History:  Procedure Laterality Date   ABDOMINAL HYSTERECTOMY     APPENDECTOMY     CATARACT EXTRACTION Left    COLONOSCOPY N/A 04/03/2015   RMR:4 polyps/mild diverticulosis/small internal hemorrhoids   COLONOSCOPY WITH ESOPHAGOGASTRODUODENOSCOPY (EGD)  02/15/2012   BTD:VVOHY sessile polyps 3-8 mm found at the cecum/ 8 polyps measuring 3-8 mm in/1 polyp measuring 6 mm in transverse colon/3-39m polyps found in the sigmoid/6 mm polyp found in the rectum/small internal hemorrhoids   ESOPHAGEAL DILATION     In the 1990s   ESOPHAGEAL DILATION  02/15/2012   SLF: Stricture was found at the gastroesophageal/ Non-erosive gastritis (inflammation)Sessile  polyp measuring 4 mm/duodenal mucosa showed no abnormalities    ESOPHAGOGASTRODUODENOSCOPY N/A 04/03/2015   RWVP:XTGGYIRSWat the gastroesophageal junction/moderate nodular gastritis   KNEE SURGERY Left    SAVORY DILATION N/A 04/03/2015   Procedure: SAVORY DILATION;  Surgeon: SDanie Binder MD;  [facility];  Service: Endoscopy;  Laterality: N/A;    Current Medications: Current Meds  Medication Sig   amLODipine (NORVASC) 10 MG tablet Take 10 mg by mouth daily.   aspirin 81 MG chewable tablet Chew 81 mg by mouth daily.   atorvastatin (LIPITOR) 40 MG tablet Take 1 tablet by mouth daily.   busPIRone (BUSPAR) 15 MG tablet Take 15 mg by mouth 3 (three) times daily. Take 1/2 tablet daily   glipiZIDE (GLUCOTROL) 5 MG tablet Take 5 mg by mouth 2 (two) times daily before a meal.   lisinopril (PRINIVIL,ZESTRIL) 10 MG tablet Take 10 mg by mouth daily.   metFORMIN (GLUCOPHAGE) 500 MG tablet Take 500 mg by mouth 2 (two) times daily with a meal.   metoprolol succinate (TOPROL XL) 50 MG 24 hr tablet Take 1 tablet (50 mg total) by mouth every evening. Take with or immediately following a meal.   metoprolol succinate (TOPROL-XL) 100 MG 24 hr tablet Take 1 tablet (100 mg total) by mouth daily.   mirtazapine (REMERON) 45 MG tablet Take 45 mg by mouth at bedtime.   Multiple Vitamin (MULTIVITAMIN WITH MINERALS) TABS  tablet Take 1 tablet by mouth daily.   nitroGLYCERIN (NITROSTAT) 0.4 MG SL tablet Place 1 tablet (0.4 mg total) under the tongue every 5 (five) minutes as needed for chest pain.   pantoprazole (PROTONIX) 40 MG tablet TAKE 1 TABLET BY MOUTH TWICE DAILY. TAKE 30 MINS PRIOR TO MEALS     Allergies:   Sulfa antibiotics   Social History   Socioeconomic History   Marital status: Divorced    Spouse name: Not on file   Number of children: 3   Years of education: Not on file   Highest education level: Not on file  Occupational History   Not on file  Tobacco Use   Smoking status: Never    Smokeless tobacco: Never  Vaping Use   Vaping Use: Never used  Substance and Sexual Activity   Alcohol use: No    Alcohol/week: 0.0 standard drinks   Drug use: No   Sexual activity: Not Currently  Other Topics Concern   Not on file  Social History Narrative   Not on file   Social Determinants of Health   Financial Resource Strain: Not on file  Food Insecurity: Not on file  Transportation Needs: Not on file  Physical Activity: Not on file  Stress: Not on file  Social Connections: Not on file     Family History:  The patient's  family history includes Brain cancer in her maternal uncle; Diabetes in her brother and brother; Heart attack in her father; Hypertension in her brother and brother; Kidney disease in her mother; Prostate cancer in her maternal uncle; Stomach cancer in her maternal uncle; Throat cancer in her paternal uncle.   ROS:   Please see the history of present illness.    ROS All other systems reviewed and are negative.   PHYSICAL EXAM:   VS:  BP 124/60    Pulse 67    Ht 5' 5"  (1.651 m)    Wt 154 lb (69.9 kg)    SpO2 95%    BMI 25.63 kg/m   Physical Exam  GEN: Well nourished, well developed, in no acute distress  Neck: no JVD, carotid bruits, or masses Cardiac:RRR; no murmurs, rubs, or gallops  Respiratory:  clear to auscultation bilaterally, normal work of breathing GI: soft, nontender, nondistended, + BS Ext: without cyanosis, clubbing, or edema, Good distal pulses bilaterally Neuro:  Alert and Oriented x 3 Psych: euthymic mood, full affect  Wt Readings from Last 3 Encounters:  04/14/21 154 lb (69.9 kg)  11/01/20 149 lb 14.6 oz (68 kg)  05/02/20 152 lb (68.9 kg)      Studies/Labs Reviewed:   EKG:  EKG is not ordered today.     Recent Labs: 11/01/2020: B Natriuretic Peptide 109.0 11/02/2020: ALT 81; BUN 14; Creatinine, Ser 0.69; Hemoglobin 9.4; Magnesium 2.0; Platelets 178; Potassium 3.7; Sodium 143   Lipid Panel No results found for: CHOL, TRIG,  HDL, CHOLHDL, VLDL, LDLCALC, LDLDIRECT  Additional studies/ records that were reviewed today include:  Lexiscan Myoview 04/22/2015: There was no ST segment deviation noted during stress. The study is normal. This is a low risk study. Nuclear stress EF: 64%.   Echocardiogram 10/10/2018:  1. The left ventricle has normal systolic function with an ejection  fraction of 60-65%. The cavity size was normal. Mild basal septal  hypertrophy. Left ventricular diastolic Doppler parameters are consistent  with impaired relaxation. Elevated left  ventricular end-diastolic pressure No evidence of left ventricular  regional wall motion abnormalities.   2.  The right ventricle has normal systolic function. The cavity was  normal. There is no increase in right ventricular wall thickness.   3. The aortic valve is tricuspid. Mild thickening of the aortic valve.  Mild aortic annular calcification noted.   4. The mitral valve is grossly normal. There is moderate mitral annular  calcification present.   5. The tricuspid valve is grossly normal.   6. The aorta is normal in size and structure.      Risk Assessment/Calculations:         ASSESSMENT:    1. Coronary artery disease involving native coronary artery of native heart without angina pectoris   2. SVT (supraventricular tachycardia) ([place])   3. Essential hypertension   4. Hyperlipidemia, unspecified hyperlipidemia type      PLAN:  In order of problems listed above:  CAD status post DES to the LAD 2009 no angina, regular exercise. Continue ASA, lipitor, metoprolol  SVT/palpitations-overall controlled on metoprolol   Hypertension BP well controlled  HLD on lipitor. Labs just done by PCP-will request records.  Shared Decision Making/Informed Consent        Medication Adjustments/Labs and Tests Ordered: Current medicines are reviewed at length with the patient today.  Concerns regarding medicines are outlined above.  Medication  changes, Labs and Tests ordered today are listed in the Patient Instructions below. There are no Patient Instructions on file for this visit.   Signed, Ermalinda Barrios, PA-C  04/14/2021 2:11 PM    [facility] [address], [place],   [address] Phone: [phone]; Fax: [phone]

## 2021-04-14 ENCOUNTER — Encounter: Payer: Self-pay | Admitting: Physician Assistant

## 2021-04-14 ENCOUNTER — Ambulatory Visit (INDEPENDENT_AMBULATORY_CARE_PROVIDER_SITE_OTHER): Payer: Medicare HMO | Admitting: Physician Assistant

## 2021-04-14 ENCOUNTER — Other Ambulatory Visit: Payer: Self-pay

## 2021-04-14 ENCOUNTER — Encounter: Payer: Self-pay | Admitting: *Deleted

## 2021-04-14 VITALS — BP 124/60 | HR 67 | Ht 65.0 in | Wt 154.0 lb

## 2021-04-14 DIAGNOSIS — I10 Essential (primary) hypertension: Secondary | ICD-10-CM | POA: Diagnosis not present

## 2021-04-14 DIAGNOSIS — I2510 Atherosclerotic heart disease of native coronary artery without angina pectoris: Secondary | ICD-10-CM | POA: Diagnosis not present

## 2021-04-14 DIAGNOSIS — I471 Supraventricular tachycardia: Secondary | ICD-10-CM

## 2021-04-14 DIAGNOSIS — E785 Hyperlipidemia, unspecified: Secondary | ICD-10-CM | POA: Diagnosis not present

## 2021-04-14 NOTE — Patient Instructions (Signed)
Medication Instructions:  Your physician recommends that you continue on your current medications as directed. Please refer to the Current Medication list given to you today.  *If you need a refill on your cardiac medications before your next appointment, please call your pharmacy*   Lab Work: NONE   If you have labs (blood work) drawn today and your tests are completely normal, you will receive your results only by: [facility] (if you have MyChart) OR A paper copy in the mail If you have any lab test that is abnormal or we need to change your treatment, we will call you to review the results.   Testing/Procedures: NONE    Follow-Up: At [facility], you and your health needs are our priority.  As part of our continuing mission to provide you with exceptional heart care, we have created designated Provider Care Teams.  These Care Teams include your primary Cardiologist (physician) and Advanced Practice Providers (APPs -  Physician Assistants and Nurse Practitioners) who all work together to provide you with the care you need, when you need it.  We recommend signing up for the patient portal called "MyChart".  Sign up information is provided on this After Visit Summary.  MyChart is used to connect with patients for Virtual Visits (Telemedicine).  Patients are able to view lab/test results, encounter notes, upcoming appointments, etc.  Non-urgent messages can be sent to your provider as well.   To learn more about what you can do with MyChart, go to [email or web address].    Your next appointment:   1 year(s)  The format for your next appointment:   In Person  Provider:   Rozann Lesches, MD    Other Instructions Thank you for choosing [facility]!

## 2021-07-07 ENCOUNTER — Telehealth: Payer: Self-pay | Admitting: Cardiology

## 2021-07-07 NOTE — Telephone Encounter (Signed)
?  Pt c/o BP issue: STAT if pt c/o blurred vision, one-sided weakness or slurred speech ? ?1. What are your last 5 BP readings? 100/50 ? ?2. Are you having any other symptoms (ex. Dizziness, headache, blurred vision, passed out)? Weak, dizziness  ? ?3. What is your BP issue? Pt is calling she said, she has pneumonia and her doctor advised her to f/u with her heart doctor too. She also said that her BP goes down to 100/50 and when that happens she feeling weak and dizzy that she cant do anything  ?

## 2021-07-07 NOTE — Telephone Encounter (Signed)
Pt stated that for the last few days her blood pressure has been between 100/50 and 102/51. Pt stated that her O2 level had dipped into the upper 80's (87% and 88%). Pt c/o weakness and dizziness, denies sob, nausea, and vomiting. Pt stated she is compliant with medications. Please advise.  ?

## 2021-07-09 NOTE — Telephone Encounter (Signed)
Satira Sark, MD    ? ?Thank you for the update.  We recently met in March.  Her blood pressure was elevated when I saw her.  She is on a number of antihypertensive medications, and we may need to cut back on at least one of them if her blood pressure is staying on the lower side at this point.  Would continue to track blood pressure measurements, but since she is having some symptoms would have her see her PCP to make sure nothing else is going on.  I presume that her heart rates have been normal during this time.   ? ?

## 2021-07-09 NOTE — Telephone Encounter (Signed)
Pt notified and verbalized understanding. Pt will reach out to PCP and schedule a f/u appt.  ?

## 2021-11-20 ENCOUNTER — Ambulatory Visit: Payer: Medicare HMO | Admitting: Cardiology

## 2022-01-06 ENCOUNTER — Other Ambulatory Visit (HOSPITAL_COMMUNITY): Payer: Self-pay | Admitting: Internal Medicine

## 2022-01-06 DIAGNOSIS — Z13820 Encounter for screening for osteoporosis: Secondary | ICD-10-CM

## 2022-05-06 ENCOUNTER — Encounter (HOSPITAL_COMMUNITY): Payer: Self-pay | Admitting: *Deleted

## 2022-05-06 ENCOUNTER — Observation Stay (HOSPITAL_COMMUNITY)
Admission: EM | Admit: 2022-05-06 | Discharge: 2022-05-08 | Disposition: A | Discharge status: Home / Self Care | Payer: Medicare HMO | Attending: Internal Medicine | Admitting: Internal Medicine

## 2022-05-06 ENCOUNTER — Other Ambulatory Visit: Payer: Self-pay

## 2022-05-06 ENCOUNTER — Emergency Department (HOSPITAL_COMMUNITY): Payer: Medicare HMO

## 2022-05-06 DIAGNOSIS — I2510 Atherosclerotic heart disease of native coronary artery without angina pectoris: Secondary | ICD-10-CM | POA: Diagnosis present

## 2022-05-06 DIAGNOSIS — E119 Type 2 diabetes mellitus without complications: Secondary | ICD-10-CM | POA: Diagnosis not present

## 2022-05-06 DIAGNOSIS — I10 Essential (primary) hypertension: Secondary | ICD-10-CM | POA: Diagnosis present

## 2022-05-06 DIAGNOSIS — D72829 Elevated white blood cell count, unspecified: Secondary | ICD-10-CM | POA: Diagnosis not present

## 2022-05-06 DIAGNOSIS — K449 Diaphragmatic hernia without obstruction or gangrene: Secondary | ICD-10-CM | POA: Diagnosis not present

## 2022-05-06 DIAGNOSIS — Z955 Presence of coronary angioplasty implant and graft: Secondary | ICD-10-CM | POA: Diagnosis not present

## 2022-05-06 DIAGNOSIS — F419 Anxiety disorder, unspecified: Secondary | ICD-10-CM | POA: Diagnosis present

## 2022-05-06 DIAGNOSIS — R1310 Dysphagia, unspecified: Secondary | ICD-10-CM | POA: Insufficient documentation

## 2022-05-06 DIAGNOSIS — R2240 Localized swelling, mass and lump, unspecified lower limb: Secondary | ICD-10-CM | POA: Diagnosis not present

## 2022-05-06 DIAGNOSIS — Z7982 Long term (current) use of aspirin: Secondary | ICD-10-CM | POA: Diagnosis not present

## 2022-05-06 DIAGNOSIS — K222 Esophageal obstruction: Secondary | ICD-10-CM | POA: Insufficient documentation

## 2022-05-06 DIAGNOSIS — I25119 Atherosclerotic heart disease of native coronary artery with unspecified angina pectoris: Secondary | ICD-10-CM | POA: Diagnosis not present

## 2022-05-06 DIAGNOSIS — Z79899 Other long term (current) drug therapy: Secondary | ICD-10-CM | POA: Insufficient documentation

## 2022-05-06 DIAGNOSIS — K921 Melena: Secondary | ICD-10-CM | POA: Diagnosis not present

## 2022-05-06 DIAGNOSIS — Z7984 Long term (current) use of oral hypoglycemic drugs: Secondary | ICD-10-CM | POA: Diagnosis not present

## 2022-05-06 DIAGNOSIS — K7581 Nonalcoholic steatohepatitis (NASH): Secondary | ICD-10-CM | POA: Diagnosis present

## 2022-05-06 DIAGNOSIS — R079 Chest pain, unspecified: Principal | ICD-10-CM | POA: Diagnosis present

## 2022-05-06 LAB — BASIC METABOLIC PANEL
Anion gap: 7 (ref 5–15)
BUN: 24 mg/dL — ABNORMAL HIGH (ref 8–23)
CO2: 22 mmol/L (ref 22–32)
Calcium: 8.8 mg/dL — ABNORMAL LOW (ref 8.9–10.3)
Chloride: 110 mmol/L (ref 98–111)
Creatinine, Ser: 0.87 mg/dL (ref 0.44–1.00)
GFR, Estimated: >60 mL/min (ref >60)
Glucose, Bld: 155 mg/dL — ABNORMAL HIGH (ref 70–99)
Potassium: 4.3 mmol/L (ref 3.5–5.1)
Sodium: 139 mmol/L (ref 135–145)

## 2022-05-06 LAB — CBC
HCT: 33.4 % — ABNORMAL LOW (ref 36.0–46.0)
Hemoglobin: 9.5 g/dL — ABNORMAL LOW (ref 12.0–15.0)
MCH: 21.6 pg — ABNORMAL LOW (ref 26.0–34.0)
MCHC: 28.4 g/dL — ABNORMAL LOW (ref 30.0–36.0)
MCV: 76.1 fL — ABNORMAL LOW (ref 80.0–100.0)
Platelets: 239 10*3/uL (ref 150–400)
RBC: 4.39 MIL/uL (ref 3.87–5.11)
RDW: 18.8 % — ABNORMAL HIGH (ref 11.5–15.5)
WBC: 18 10*3/uL — ABNORMAL HIGH (ref 4.0–10.5)
nRBC: 0.1 % (ref 0.0–0.2)

## 2022-05-06 LAB — URINALYSIS, ROUTINE W REFLEX MICROSCOPIC
Bacteria, UA: NONE SEEN
Bilirubin Urine: NEGATIVE
Glucose, UA: NEGATIVE mg/dL
Hgb urine dipstick: NEGATIVE
Ketones, ur: NEGATIVE mg/dL
Leukocytes,Ua: NEGATIVE
Nitrite: NEGATIVE
Protein, ur: 30 mg/dL — AB
Specific Gravity, Urine: 1.01 (ref 1.005–1.030)
pH: 6 (ref 5.0–8.0)

## 2022-05-06 LAB — TROPONIN I (HIGH SENSITIVITY)
Troponin I (High Sensitivity): 4 ng/L (ref <18)
Troponin I (High Sensitivity): 3 ng/L (ref <18)

## 2022-05-06 LAB — MAGNESIUM: Magnesium: 2 mg/dL (ref 1.7–2.4)

## 2022-05-06 LAB — GLUCOSE, CAPILLARY: Glucose-Capillary: 247 mg/dL — ABNORMAL HIGH (ref 70–99)

## 2022-05-06 MED ORDER — ACETAMINOPHEN 650 MG RE SUPP: 650.0000 mg | Freq: Four times a day (QID) | RECTAL | Status: DC | PRN

## 2022-05-06 MED ORDER — METOPROLOL SUCCINATE ER 50 MG PO TB24
50.0000 mg | ORAL_TABLET | Freq: Every evening | ORAL | Status: DC
  Administered 2022-05-06 – 2022-05-07 (×2): 50 mg via ORAL
  Filled 2022-05-06 (×2): qty 1

## 2022-05-06 MED ORDER — ONDANSETRON HCL 4 MG/2ML IJ SOLN: 4.0000 mg | Freq: Four times a day (QID) | INTRAMUSCULAR | Status: DC | PRN

## 2022-05-06 MED ORDER — ASPIRIN 81 MG PO CHEW: 324.0000 mg | CHEWABLE_TABLET | Freq: Once | ORAL | Status: DC

## 2022-05-06 MED ORDER — ASPIRIN 81 MG PO CHEW
81.0000 mg | CHEWABLE_TABLET | Freq: Every day | ORAL | Status: DC
  Administered 2022-05-07 – 2022-05-08 (×2): 81 mg via ORAL
  Filled 2022-05-06 (×2): qty 1

## 2022-05-06 MED ORDER — ASPIRIN 81 MG PO CHEW
273.0000 mg | CHEWABLE_TABLET | Freq: Once | ORAL | Status: DC
  Filled 2022-05-06: qty 4

## 2022-05-06 MED ORDER — MIRTAZAPINE 30 MG PO TABS
45.0000 mg | ORAL_TABLET | Freq: Every day | ORAL | Status: DC
  Administered 2022-05-06 – 2022-05-07 (×2): 45 mg via ORAL
  Filled 2022-05-06 (×2): qty 1

## 2022-05-06 MED ORDER — NITROGLYCERIN 0.4 MG SL SUBL
0.4000 mg | SUBLINGUAL_TABLET | SUBLINGUAL | Status: DC | PRN
  Administered 2022-05-06 (×2): 0.4 mg via SUBLINGUAL
  Filled 2022-05-06: qty 1

## 2022-05-06 MED ORDER — ACETAMINOPHEN 325 MG PO TABS: 650.0000 mg | ORAL_TABLET | Freq: Four times a day (QID) | ORAL | Status: DC | PRN

## 2022-05-06 MED ORDER — HYDRALAZINE HCL 20 MG/ML IJ SOLN
5.0000 mg | INTRAMUSCULAR | Status: DC | PRN
  Filled 2022-05-06: qty 1

## 2022-05-06 MED ORDER — INSULIN ASPART 100 UNIT/ML IJ SOLN
0.0000 [IU] | Freq: Every day | INTRAMUSCULAR | Status: DC
  Administered 2022-05-06: 2 [IU] via SUBCUTANEOUS

## 2022-05-06 MED ORDER — ATORVASTATIN CALCIUM 40 MG PO TABS
40.0000 mg | ORAL_TABLET | Freq: Every day | ORAL | Status: DC
  Administered 2022-05-06 – 2022-05-08 (×3): 40 mg via ORAL
  Filled 2022-05-06 (×3): qty 1

## 2022-05-06 MED ORDER — METOPROLOL SUCCINATE ER 50 MG PO TB24
100.0000 mg | ORAL_TABLET | Freq: Every day | ORAL | Status: DC
  Administered 2022-05-07 – 2022-05-08 (×2): 100 mg via ORAL
  Filled 2022-05-06 (×2): qty 2

## 2022-05-06 MED ORDER — ASPIRIN 81 MG PO CHEW
243.0000 mg | CHEWABLE_TABLET | Freq: Once | ORAL | Status: AC
  Administered 2022-05-06: 243 mg via ORAL

## 2022-05-06 MED ORDER — AMLODIPINE BESYLATE 5 MG PO TABS
10.0000 mg | ORAL_TABLET | Freq: Every day | ORAL | Status: DC
  Administered 2022-05-07 – 2022-05-08 (×2): 10 mg via ORAL
  Filled 2022-05-06 (×2): qty 2

## 2022-05-06 MED ORDER — ENOXAPARIN SODIUM 40 MG/0.4ML IJ SOSY
40.0000 mg | PREFILLED_SYRINGE | INTRAMUSCULAR | Status: DC
  Administered 2022-05-06: 40 mg via SUBCUTANEOUS
  Filled 2022-05-06 (×2): qty 0.4

## 2022-05-06 MED ORDER — ONDANSETRON HCL 4 MG PO TABS: 4.0000 mg | ORAL_TABLET | Freq: Four times a day (QID) | ORAL | Status: DC | PRN

## 2022-05-06 MED ORDER — PANTOPRAZOLE SODIUM 40 MG PO TBEC
40.0000 mg | DELAYED_RELEASE_TABLET | Freq: Two times a day (BID) | ORAL | Status: DC
  Administered 2022-05-07 – 2022-05-08 (×3): 40 mg via ORAL
  Filled 2022-05-06 (×3): qty 1

## 2022-05-06 MED ORDER — POLYETHYLENE GLYCOL 3350 17 G PO PACK: 17.0000 g | PACK | Freq: Every day | ORAL | Status: DC | PRN

## 2022-05-06 MED ORDER — INSULIN ASPART 100 UNIT/ML IJ SOLN
0.0000 [IU] | Freq: Three times a day (TID) | INTRAMUSCULAR | Status: DC
  Administered 2022-05-07: 1 [IU] via SUBCUTANEOUS
  Administered 2022-05-07 (×2): 2 [IU] via SUBCUTANEOUS
  Administered 2022-05-08: 1 [IU] via SUBCUTANEOUS

## 2022-05-06 MED ORDER — BUSPIRONE HCL 5 MG PO TABS
15.0000 mg | ORAL_TABLET | Freq: Three times a day (TID) | ORAL | Status: DC
  Administered 2022-05-06 – 2022-05-08 (×5): 15 mg via ORAL
  Filled 2022-05-06 (×5): qty 3

## 2022-05-06 NOTE — H&P (Signed)
History and Physical    Carmen [name] [id number] DOB: [birth year] DOA: 05/06/2022  PCP: [facility]   Patient coming from: Home  I have personally briefly reviewed patient's old medical records in [place]  Chief Complaint: Chest Pain  HPI: Carmen [name] is a 82 y.o. female with medical history significant for coronary artery disease, diabetes mellitus, hypertension, NASH. Patient presented to the ED with 2 days of mid-left chest pain described as pressure.  3/4, patient had a cortisone shot to her right shoulder, later that evening symptoms started with 'heart fluttering".  She has had cortisone shots in the past without any problems.  Chest pain radiates to the back.  She also reports difficulty breathing with chest pain.  Chest pain is intermittent, with no known relieving or aggravating factors.  But she reports over the past week she has noticed decreased exercise tolerance.  At baseline she is quite active.  Patient reports her current symptoms feel similar to symptoms she had in 2009 when she required cardiac stents placed. No legs swelling, no history of DVT or blood clots.  ED Course: Blood pressure 150s to 170.  O2 sats 91 to 93% on room air.  Troponin 4 > 3.  Leukocytosis of 18.  Magnesium 2.  UA not suggestive of infection.  Chest x-ray clear.  EKG shows sinus rhythm, unchanged from prior. Hospitalist to admit for chest pain.  Review of Systems: As per HPI all other systems reviewed and negative.  Past Medical History:  Diagnosis Date   CAD (coronary artery disease)    DES to LAD 2009   Essential hypertension    GERD (gastroesophageal reflux disease)    Hyperlipidemia    Palpitations    Type 2 diabetes mellitus ([place])     Past Surgical History:  Procedure Laterality Date   ABDOMINAL HYSTERECTOMY     APPENDECTOMY     CATARACT EXTRACTION Left    COLONOSCOPY N/A 04/03/2015   RMR:4 polyps/mild diverticulosis/small internal  hemorrhoids   COLONOSCOPY WITH ESOPHAGOGASTRODUODENOSCOPY (EGD)  02/15/2012   [id number] sessile polyps 3-8 mm found at the cecum/ 8 polyps measuring 3-8 mm in/1 polyp measuring 6 mm in transverse colon/3-13m polyps found in the sigmoid/6 mm polyp found in the rectum/small internal hemorrhoids   ESOPHAGEAL DILATION     In the 1990s   ESOPHAGEAL DILATION  02/15/2012   SLF: Stricture was found at the gastroesophageal/ Non-erosive gastritis (inflammation)Sessile polyp measuring 4 mm/duodenal mucosa showed no abnormalities    ESOPHAGOGASTRODUODENOSCOPY N/A 04/03/2015   [id number] the gastroesophageal junction/moderate nodular gastritis   KNEE SURGERY Left    SAVORY DILATION N/A 04/03/2015   Procedure: SAVORY DILATION;  Surgeon: SDanie Binder MD;  [facility];  Service: Endoscopy;  Laterality: N/A;     reports that she has never smoked. She has never used smokeless tobacco. She reports that she does not drink alcohol and does not use drugs.  Allergies  Allergen Reactions   Sulfa Antibiotics Nausea And Vomiting    Family History  Problem Relation Age of Onset   Heart attack Father    Kidney disease Mother    Diabetes Brother    Hypertension Brother    Stomach cancer Maternal Uncle    Throat cancer Paternal Uncle    Diabetes Brother    Hypertension Brother    Prostate cancer Maternal Uncle    Brain cancer Maternal Uncle    Colon cancer Neg Hx  Liver disease Neg Hx     Prior to Admission medications   Medication Sig Start Date End Date Taking? Authorizing Provider  amLODipine (NORVASC) 10 MG tablet Take 10 mg by mouth daily.   Yes [provider]  aspirin 81 MG chewable tablet Chew 81 mg by mouth daily.   Yes [provider]  atorvastatin (LIPITOR) 40 MG tablet Take 1 tablet by mouth daily. 05/01/17  Yes [provider]  busPIRone (BUSPAR) 15 MG tablet Take 15 mg by mouth 3 (three) times daily. Take 1/2 tablet daily   Yes [provider]  cephALEXin (KEFLEX) 250 MG capsule Take 250 mg by mouth at bedtime.   Yes [provider]  glipiZIDE (GLUCOTROL) 5 MG tablet Take 5 mg by mouth 2 (two) times daily before a meal.   Yes [provider]  LANTUS SOLOSTAR 100 UNIT/ML Solostar Pen Inject 10 Units into the skin daily as needed (Blood sugar). 08/18/21  Yes [provider]  metFORMIN (GLUCOPHAGE) 500 MG tablet Take 500 mg by mouth 2 (two) times daily with a meal.   Yes [provider]  metoprolol succinate (TOPROL XL) 50 MG 24 hr tablet Take 1 tablet (50 mg total) by mouth every evening. Take with or immediately following a meal. 09/26/19  Yes Verta Ellen., NP  metoprolol succinate (TOPROL-XL) 100 MG 24 hr tablet Take 1 tablet (100 mg total) by mouth daily. 11/26/20  Yes Verta Ellen., NP  mirtazapine (REMERON) 45 MG tablet Take 45 mg by mouth at bedtime.   Yes [provider]  Multiple Vitamin (MULTIVITAMIN WITH MINERALS) TABS tablet Take 1 tablet by mouth daily.   Yes [provider]  pantoprazole (PROTONIX) 40 MG tablet TAKE 1 TABLET BY MOUTH TWICE DAILY. TAKE 30 MINS PRIOR TO MEALS Patient taking differently: Take 40 mg by mouth 2 (two) times daily before a meal. 07/06/13  Yes Annitta Needs, NP    Physical Exam: Vitals:   05/06/22 1558 05/06/22 1600 05/06/22 1615 05/06/22 1900  BP:  (!) 162/57  (!) 170/80  Pulse: 63 61 62   Resp:   13 (!) 22  SpO2: 93% 91% 93% 92%  Weight: 71.7 kg     Height: '5\' 5"'$  (1.651 m)       Constitutional: NAD, calm, comfortable Vitals:   05/06/22 1558 05/06/22 1600 05/06/22 1615 05/06/22 1900  BP:  (!) 162/57  (!) 170/80  Pulse: 63 61 62   Resp:   13 (!) 22  SpO2: 93% 91% 93% 92%  Weight: 71.7 kg     Height: '5\' 5"'$  (1.651 m)      Eyes: Pupils equal, lids and conjunctivae normal ENMT: Mucous membranes are moist.   Neck: normal, supple, no masses, no thyromegaly Respiratory: clear to auscultation bilaterally, no  wheezing, no crackles. Normal respiratory effort. No accessory muscle use.  Cardiovascular: Regular rate and rhythm, no murmurs / rubs / gallops. No extremity edema.  Extremities warm. Abdomen: no tenderness, no masses palpated. No hepatosplenomegaly. Bowel sounds positive.  Musculoskeletal: no clubbing / cyanosis. No joint deformity upper and lower extremities.  Skin: no rashes, lesions, ulcers. No induration Neurologic: No apparent cranial nerve abnormality, moving extremities spontaneously.  Psychiatric: Normal judgment and insight. Alert and oriented x 3. Normal mood.   Labs on Admission: I have personally reviewed following labs and imaging studies  CBC: Recent Labs  Lab 05/06/22 1609  WBC 18.0*  HGB 9.5*  HCT 33.4*  MCV  76.1*  PLT [id number]   Basic Metabolic Panel: Recent Labs  Lab 05/06/22 1609  NA 139  K 4.3  CL 110  CO2 22  GLUCOSE 155*  BUN 24*  CREATININE 0.87  CALCIUM 8.8*  MG 2.0   Urine analysis:    Component Value Date/Time   COLORURINE STRAW (A) 05/06/2022 1830   APPEARANCEUR CLEAR 05/06/2022 1830   LABSPEC 1.010 05/06/2022 1830   PHURINE 6.0 05/06/2022 1830   GLUCOSEU NEGATIVE 05/06/2022 1830   HGBUR NEGATIVE 05/06/2022 1830   [place] NEGATIVE 05/06/2022 1830   KETONESUR NEGATIVE 05/06/2022 1830   PROTEINUR 30 (A) 05/06/2022 1830   UROBILINOGEN 0.2 04/04/2012 1651   NITRITE NEGATIVE 05/06/2022 1830   LEUKOCYTESUR NEGATIVE 05/06/2022 1830    Radiological Exams on Admission: DG Chest Portable 1 View  Result Date: 05/06/2022 CLINICAL DATA:  Chest pain EXAM: PORTABLE CHEST 1 VIEW COMPARISON:  Radiograph 11/01/2020 FINDINGS: Borderline enlarged cardiac silhouette. There is no focal airspace consolidation. There is no pleural effusion or evidence of pneumothorax. Thoracic spondylosis. No acute osseous abnormality. IMPRESSION: Borderline enlarged cardiac silhouette.  No focal airspace disease. Electronically Signed   By: Maurine Simmering M.D.   On: 05/06/2022  16:36    EKG: Independently reviewed.  Sinus rhythm, rate 65, QTc 439.  No significant change from prior.  Assessment/Plan Principal Problem:   Chest pain Active Problems:   CAD (coronary artery disease)   HTN (hypertension)   Type 2 diabetes mellitus (H[facility])   Anxiety   Nonalcoholic steatohepatitis ([facility])    Assessment and Plan: * Chest pain History of coronary artery disease with DES stent placement 2009.  Chest pain with reduced exercise tolerance of 1 week duration.  Reports symptoms similar to prior requiring stent. Follows with cardiology.  Reports compliance with aspirin, statins, metoprolol.  Troponin 4 > 8.  EKG without changes. - N.p.o. midnight - Obtain echocardiogram - Cardiology consultation in a.m. -Resume aspirin, statins, metoprolol -Nitro as needed  Type 2 diabetes mellitus ([place]) _ hgbA1c - SSI- S -Hold home glipizide, metformin  HTN (hypertension) Blood pressure elevated 160s to 170s. -Continue home Norvasc, metoprolol - IV Hydralazine '5mg'$  for systolic > [id number].  Anxiety Resume buspirone  Nonalcoholic steatohepatitis ([facility]) Stable. Not decompensated.   DVT prophylaxis:  Lovenox Code Status:  Full code-family patient and daughter Daine Floras at bedside. Family Communication: [name] at bedside is primary Media planner.  Patient does not and has not signed paperwork designating [place]. Disposition Plan:  ~ 1-2 days Consults called:  Cardiology Admission status:  Obs tele    Author: Bethena Roys, MD 05/06/2022 9:53 PM  For on call review [email or web address].

## 2022-05-06 NOTE — Assessment & Plan Note (Addendum)
History of coronary artery disease with DES stent placement 2009.  Chest pain with reduced exercise tolerance of 1 week duration.  Reports symptoms similar to prior requiring stent. Follows with cardiology.  Reports compliance with aspirin, statins, metoprolol.  Troponin 4 > 8.  EKG without changes. - N.p.o. midnight - Obtain echocardiogram - Cardiology consultation in a.m. -Resume aspirin, statins, metoprolol -Nitro as needed

## 2022-05-06 NOTE — ED Triage Notes (Signed)
Pt with mid chest pressure and "fluttering" in her chest for about a week.  Pt's PCP sent to ED.

## 2022-05-06 NOTE — Assessment & Plan Note (Addendum)
Stable. Not decompensated.

## 2022-05-06 NOTE — ED Provider Notes (Addendum)
[facility] Provider Note   CSN: [id number] Arrival date & time: 05/06/22  1544     History  Chief Complaint  Patient presents with   Chest Pain    Carmen [name] is a 82 y.o. female with a history including CAD with stent placement more than 10 years ago, hyperlipidemia, hypertension, type 2 diabetes, history of SVT controlled with metoprolol 100 mg every morning and 50 mg nightly presenting for evaluation of fluttering episodes in her chest in association with chest pressure.  She has had constant chest pressure for the past week, in association with intermittent episodes of brief fluttering sensations.  She was seen by her primary MD today and was advised to come here for further evaluation.  She presents with an EKG from their office which showed normal sinus rhythm except for 1 isolated PAC.  She does endorse some shortness of breath and reduced exercise tolerance, denies orthopnea, symptoms are better at rest.  Denies increased peripheral edema.  She does state this pressure is similar to her symptoms prior to needing stent placement.  She is followed by Dr. Domenic Polite, appointment next week to see him, most recent studies noted below.      Lexiscan Myoview 04/22/2015: There was no ST segment deviation noted during stress. The study is normal. This is a low risk study. Nuclear stress EF: 64%.   Echocardiogram 10/10/2018:  1. The left ventricle has normal systolic function with an ejection  fraction of 60-65%. The cavity size was normal. Mild basal septal  hypertrophy. Left ventricular diastolic Doppler parameters are consistent  with impaired relaxation. Elevated left  ventricular end-diastolic pressure No evidence of left ventricular  regional wall motion abnormalities.   2. The right ventricle has normal systolic function. The cavity was  normal. There is no increase in right ventricular wall thickness.   3. The aortic valve is  tricuspid. Mild thickening of the aortic valve.  Mild aortic annular calcification noted.   4. The mitral valve is grossly normal. There is moderate mitral annular  calcification present.   5. The tricuspid valve is grossly normal.   6. The aorta is normal in size and structure.     The history is provided by the patient.       Home Medications Prior to Admission medications   Medication Sig Start Date End Date Taking? Authorizing Provider  amLODipine (NORVASC) 10 MG tablet Take 10 mg by mouth daily.   Yes [provider]  aspirin 81 MG chewable tablet Chew 81 mg by mouth daily.   Yes [provider]  atorvastatin (LIPITOR) 40 MG tablet Take 1 tablet by mouth daily. 05/01/17  Yes [provider]  busPIRone (BUSPAR) 15 MG tablet Take 15 mg by mouth 3 (three) times daily. Take 1/2 tablet daily   Yes [provider]  cephALEXin (KEFLEX) 250 MG capsule Take 250 mg by mouth at bedtime.   Yes [provider]  glipiZIDE (GLUCOTROL) 5 MG tablet Take 5 mg by mouth 2 (two) times daily before a meal.   Yes [provider]  LANTUS SOLOSTAR 100 UNIT/ML Solostar Pen Inject 10 Units into the skin daily as needed (Blood sugar). 08/18/21  Yes [provider]  metFORMIN (GLUCOPHAGE) 500 MG tablet Take 500 mg by mouth 2 (two) times daily with a meal.   Yes [provider]  metoprolol succinate (TOPROL XL) 50 MG 24 hr tablet Take 1 tablet (50 mg total) by  mouth every evening. Take with or immediately following a meal. 09/26/19  Yes Verta Ellen., NP  metoprolol succinate (TOPROL-XL) 100 MG 24 hr tablet Take 1 tablet (100 mg total) by mouth daily. 11/26/20  Yes Verta Ellen., NP  mirtazapine (REMERON) 45 MG tablet Take 45 mg by mouth at bedtime.   Yes [provider]  Multiple Vitamin (MULTIVITAMIN WITH MINERALS) TABS tablet Take 1 tablet by mouth daily.   Yes [provider]  pantoprazole (PROTONIX) 40 MG tablet  TAKE 1 TABLET BY MOUTH TWICE DAILY. TAKE 30 MINS PRIOR TO MEALS Patient taking differently: Take 40 mg by mouth 2 (two) times daily before a meal. 07/06/13  Yes Annitta Needs, NP      Allergies    Sulfa antibiotics    Review of Systems   Review of Systems  Constitutional:  Positive for fatigue. Negative for fever.  HENT: Negative.    Eyes: Negative.   Respiratory:  Positive for shortness of breath. Negative for chest tightness.   Cardiovascular:  Positive for chest pain and palpitations.  Gastrointestinal:  Negative for abdominal pain and nausea.  Genitourinary: Negative.   Musculoskeletal:  Negative for arthralgias, joint swelling and neck pain.  Skin: Negative.  Negative for rash and wound.  Neurological:  Negative for dizziness, weakness, light-headedness, numbness and headaches.  Psychiatric/Behavioral: Negative.      Physical Exam Updated Vital Signs BP (!) 170/80   Pulse 62   Resp (!) 22   Ht '5\' 5"'$  (1.651 m)   Wt 71.7 kg   SpO2 92%   BMI 26.29 kg/m  Physical Exam Vitals and nursing note reviewed.  Constitutional:      Appearance: She is well-developed.  HENT:     Head: Normocephalic and atraumatic.  Eyes:     Conjunctiva/sclera: Conjunctivae normal.  Cardiovascular:     Rate and Rhythm: Normal rate and regular rhythm.     Heart sounds: Normal heart sounds. No murmur heard. Pulmonary:     Effort: Pulmonary effort is normal.     Breath sounds: Normal breath sounds. No decreased breath sounds, wheezing or rales.  Abdominal:     General: Bowel sounds are normal.     Palpations: Abdomen is soft.     Tenderness: There is no abdominal tenderness.  Musculoskeletal:        General: Normal range of motion.     Cervical back: Normal range of motion.  Skin:    General: Skin is warm and dry.  Neurological:     Mental Status: She is alert.     ED Results / Procedures / Treatments   Labs (all labs ordered are listed, but only abnormal results are displayed) Labs  Reviewed  BASIC METABOLIC PANEL - Abnormal; Notable for the following components:      Result Value   Glucose, Bld 155 (*)    BUN 24 (*)    Calcium 8.8 (*)    All other components within normal limits  CBC - Abnormal; Notable for the following components:   WBC 18.0 (*)    Hemoglobin 9.5 (*)    HCT 33.4 (*)    MCV 76.1 (*)    MCH 21.6 (*)    MCHC 28.4 (*)    RDW 18.8 (*)    All other components within normal limits  URINALYSIS, ROUTINE W REFLEX MICROSCOPIC - Abnormal; Notable for the following components:   Color, Urine STRAW (*)    Protein, ur 30 (*)  All other components within normal limits  MAGNESIUM  TROPONIN I (HIGH SENSITIVITY)  TROPONIN I (HIGH SENSITIVITY)    EKG EKG Interpretation  Date/Time:  Wednesday May 06 2022 16:03:18 EST Ventricular Rate:  65 PR Interval:  169 QRS Duration: 92 QT Interval:  422 QTC Calculation: 439 R Axis:   6 Text Interpretation: Sinus rhythm Baseline wander in lead(s) I II aVR Confirmed by Milton Ferguson [phone]) on 05/06/2022 7:00:39 PM  Radiology DG Chest Portable 1 View  Result Date: 05/06/2022 CLINICAL DATA:  Chest pain EXAM: PORTABLE CHEST 1 VIEW COMPARISON:  Radiograph 11/01/2020 FINDINGS: Borderline enlarged cardiac silhouette. There is no focal airspace consolidation. There is no pleural effusion or evidence of pneumothorax. Thoracic spondylosis. No acute osseous abnormality. IMPRESSION: Borderline enlarged cardiac silhouette.  No focal airspace disease. Electronically Signed   By: Maurine Simmering M.D.   On: 05/06/2022 16:36    Procedures Procedures    Medications Ordered in ED Medications  nitroGLYCERIN (NITROSTAT) SL tablet 0.4 mg (0.4 mg Sublingual Given 05/06/22 1657)  aspirin chewable tablet 243 mg (243 mg Oral Given 05/06/22 1656)    ED Course/ Medical Decision Making/ A&P             HEART Score: 5                Medical Decision Making Patient with a known history of coronary disease, 1 week history of chronic chest  pressure which reminds her of symptoms prior to her last stent placement, increasing exertional shortness of breath and reduced exercise tolerance concerning for possible unstable angina.  She has had 2 negative delta troponins here which is reassuring, however with heart score 5, patient would be best served by overnight observation with cardiac consult in the morning.  Patient is agreeable to this plan.  She does have increasing episodic but transient palpations, this appears to be PACs.  Her electrolytes are stable today.  Including a normal potassium and magnesium level.  She does have an elevated WBC count at 18,000 of unclear etiology.  She does states she has a strong history of problems with frequent UTIs, although her UA today is negative.  Additionally she does have anemia with a hemoglobin of 9.5 this is a chronic finding, this is a microcytic anemia.  Of note, she was given sublingual nitroglycerin 2 tablets after which her chest pressure resolved.  She has not had a return of symptoms.    Amount and/or Complexity of Data Reviewed Labs: ordered.    Details: Significant labs mentioned above. Radiology: ordered.    Details: Chest x-ray is clear without infection. ECG/medicine tests: ordered.    Details: Nsr,  rate 65 Discussion of management or test interpretation with external provider(s): Call placed to the hospitalist for admission.   Discussed with Dr. Denton Brick who accepts pt for admission.   Risk OTC drugs. Prescription drug management. Decision regarding hospitalization.           Final Clinical Impression(s) / ED Diagnoses Final diagnoses:  Chest pain, unspecified type  Leukocytosis, unspecified type    Rx / DC Orders ED Discharge Orders     None         Landis Martins 05/06/22 1859    Evalee Jefferson, PA-C 05/06/22 1917    Milton Ferguson, MD 05/08/22 [phone]

## 2022-05-06 NOTE — Assessment & Plan Note (Signed)
Resume buspirone

## 2022-05-06 NOTE — Assessment & Plan Note (Signed)
Blood pressure elevated 160s to 170s. -Continue home Norvasc, metoprolol - IV Hydralazine '5mg'$  for systolic > 99991111.

## 2022-05-06 NOTE — Assessment & Plan Note (Signed)
_ hgbA1c - SSI- S -Hold home glipizide, metformin

## 2022-05-07 ENCOUNTER — Observation Stay (HOSPITAL_COMMUNITY): Payer: Medicare HMO

## 2022-05-07 ENCOUNTER — Observation Stay (HOSPITAL_BASED_OUTPATIENT_CLINIC_OR_DEPARTMENT_OTHER): Payer: Medicare HMO

## 2022-05-07 ENCOUNTER — Encounter (HOSPITAL_COMMUNITY): Payer: Self-pay | Admitting: Internal Medicine

## 2022-05-07 DIAGNOSIS — D72829 Elevated white blood cell count, unspecified: Secondary | ICD-10-CM

## 2022-05-07 DIAGNOSIS — E785 Hyperlipidemia, unspecified: Secondary | ICD-10-CM

## 2022-05-07 DIAGNOSIS — R079 Chest pain, unspecified: Secondary | ICD-10-CM

## 2022-05-07 DIAGNOSIS — R072 Precordial pain: Secondary | ICD-10-CM

## 2022-05-07 DIAGNOSIS — K7581 Nonalcoholic steatohepatitis (NASH): Secondary | ICD-10-CM | POA: Diagnosis not present

## 2022-05-07 DIAGNOSIS — I10 Essential (primary) hypertension: Secondary | ICD-10-CM | POA: Diagnosis not present

## 2022-05-07 LAB — NM MYOCAR MULTI W/SPECT W/WALL MOTION / EF
Estimated workload: 1
Exercise duration (min): 0 min
Exercise duration (sec): 0 s
LV dias vol: 97 mL (ref 46–106)
LV sys vol: 31 mL
MPHR: 139 {beats}/min
Nuc Stress EF: 68 %
Peak HR: 83 {beats}/min
Percent HR: 59 %
RATE: 0.4
Rest HR: 60 {beats}/min
Rest Nuclear Isotope Dose: 11 mCi
SDS: 2
SRS: 4
SSS: 6
ST Depression (mm): 0 mm
Stress Nuclear Isotope Dose: 30 mCi
TID: 1.07

## 2022-05-07 LAB — IRON AND TIBC
Iron: 19 ug/dL — ABNORMAL LOW (ref 28–170)
Saturation Ratios: 4 % — ABNORMAL LOW (ref 10.4–31.8)
TIBC: 434 ug/dL (ref 250–450)
UIBC: 415 ug/dL

## 2022-05-07 LAB — ECHOCARDIOGRAM COMPLETE
AR max vel: 2.13 cm2
AV Area VTI: 2.22 cm2
AV Area mean vel: 1.79 cm2
AV Mean grad: 4.3 mmHg
AV Peak grad: 8.2 mmHg
Ao pk vel: 1.44 m/s
Area-P 1/2: 4.31 cm2
Height: 65 in
MV M vel: 5.67 m/s
MV Peak grad: 128.4 mmHg
S' Lateral: 2.9 cm
Weight: 2531.2 oz

## 2022-05-07 LAB — GLUCOSE, CAPILLARY
Glucose-Capillary: 146 mg/dL — ABNORMAL HIGH (ref 70–99)
Glucose-Capillary: 176 mg/dL — ABNORMAL HIGH (ref 70–99)
Glucose-Capillary: 161 mg/dL — ABNORMAL HIGH (ref 70–99)
Glucose-Capillary: 179 mg/dL — ABNORMAL HIGH (ref 70–99)

## 2022-05-07 LAB — CBC
HCT: 33.9 % — ABNORMAL LOW (ref 36.0–46.0)
Hemoglobin: 9.5 g/dL — ABNORMAL LOW (ref 12.0–15.0)
MCH: 21.3 pg — ABNORMAL LOW (ref 26.0–34.0)
MCHC: 28 g/dL — ABNORMAL LOW (ref 30.0–36.0)
MCV: 76.2 fL — ABNORMAL LOW (ref 80.0–100.0)
Platelets: 224 10*3/uL (ref 150–400)
RBC: 4.45 MIL/uL (ref 3.87–5.11)
RDW: 18.6 % — ABNORMAL HIGH (ref 11.5–15.5)
WBC: 14 10*3/uL — ABNORMAL HIGH (ref 4.0–10.5)
nRBC: 0.1 % (ref 0.0–0.2)

## 2022-05-07 LAB — VITAMIN B12: Vitamin B-12: 362 pg/mL (ref 180–914)

## 2022-05-07 LAB — FOLATE: Folate: 23.1 ng/mL (ref >5.9)

## 2022-05-07 LAB — FERRITIN: Ferritin: 6 ng/mL — ABNORMAL LOW (ref 11–307)

## 2022-05-07 MED ORDER — FERROUS SULFATE 325 (65 FE) MG PO TABS: 325.0000 mg | ORAL_TABLET | Freq: Every day | ORAL | 3 refills | Status: AC

## 2022-05-07 MED ORDER — LOSARTAN POTASSIUM 50 MG PO TABS
25.0000 mg | ORAL_TABLET | Freq: Every day | ORAL | Status: DC
  Administered 2022-05-07 – 2022-05-08 (×2): 25 mg via ORAL
  Filled 2022-05-07 (×2): qty 1

## 2022-05-07 MED ORDER — SODIUM CHLORIDE 0.9 % IV SOLN
250.0000 mg | Freq: Once | INTRAVENOUS | Status: DC
  Filled 2022-05-07: qty 20

## 2022-05-07 MED ORDER — SODIUM CHLORIDE FLUSH 0.9 % IV SOLN
INTRAVENOUS | Status: AC
  Administered 2022-05-07: 10 mL via INTRAVENOUS
  Filled 2022-05-07: qty 10

## 2022-05-07 MED ORDER — REGADENOSON 0.4 MG/5ML IV SOLN
INTRAVENOUS | Status: AC
  Administered 2022-05-07: 0.4 mg via INTRAVENOUS
  Filled 2022-05-07: qty 5

## 2022-05-07 MED ORDER — TECHNETIUM TC 99M TETROFOSMIN IV KIT
10.0000 | PACK | Freq: Once | INTRAVENOUS | Status: AC | PRN
  Administered 2022-05-07: 11 via INTRAVENOUS

## 2022-05-07 MED ORDER — LOSARTAN POTASSIUM 25 MG PO TABS: 25.0000 mg | ORAL_TABLET | Freq: Every day | ORAL | 1 refills | Status: DC

## 2022-05-07 MED ORDER — TECHNETIUM TC 99M TETROFOSMIN IV KIT
30.0000 | PACK | Freq: Once | INTRAVENOUS | Status: AC | PRN
  Administered 2022-05-07: 30 via INTRAVENOUS

## 2022-05-07 MED ORDER — FERROUS SULFATE 325 (65 FE) MG PO TABS
325.0000 mg | ORAL_TABLET | Freq: Every day | ORAL | Status: DC
  Administered 2022-05-08: 325 mg via ORAL
  Filled 2022-05-07: qty 1

## 2022-05-07 NOTE — Progress Notes (Signed)
PROGRESS NOTE  Carmen [name] [id number] DOB: 1941/01/17 DOA: 05/06/2022 PCP: [facility]  Brief History:  82 year old female with a history of diabetes mellitus type 2, hyperlipidemia, coronary artery disease status post DES to LAD 2009, hypertension, NASH, SVT presenting with substernal chest discomfort that began on 05/04/2022.  The patient describes a sensation as a "fluttering" and "pressure" on the evening of 05/04/2022.  She felt that it may have been related to a cortisone injection she received earlier in the day into her right shoulder for arthritis.  She stated that the chest discomfort and fluttering lasted "all night".  Over the next 2 days, the patient continued to have intermittent episodes of chest fluttering sensation, although she was not able to clarify the duration.  She stated that " it was long enough to scare me".  She had an associated sensation of shortness of breath.  She denied any dizziness, nausea, vomiting, diaphoresis.  She denies any coughing, hemoptysis, fevers, chills.  She went to see her PCP on 05/06/2022.  She brought an EKG from the office with her which shows sinus rhythm with no concerning ST-T wave changes.  She states that she has been taking 4 Aleve pills daily for the past week secondary to her arthritis pain in her shoulder.  She denies any other new medications. In the ED, the patient was afebrile and hemodynamically stable with oxygen saturation 93% on room air.  WBC 18.0, hemoglobin 9.5, platelets 239,000.  Sodium 139, potassium 4.3, bicarbonate 22, serum creatinine 0.7.  Troponin 4>> 3.  EKG shows sinus rhythm with nonspecific T wave change.  UA was negative for pyuria.  Cardiology was consulted to assist.   Assessment/Plan: Chest discomfort coronary disease -Patient describes it as " heart fluttering" sensation -Troponins unremarkable -Personally reviewed EKG--sinus rhythm, no concerning ST or T wave  change -Echocardiogram -Cardiology consult -Personally reviewed telemetry -did not reveal any dysrhythmia  Diabetes mellitus type 2 -NovoLog sliding scale -Holding metformin and glipizide -Hemoglobin A1c  Essential hypertension -Continue metoprolol succinate and amlodipine  History of SVT -Continue metoprolol succinate  Hyperlipidemia -Continue Lipitor  Anxiety -Continue BuSpar  Microcytic anemia -Check iron studies   Assessment and Plan: * Chest pain History of coronary artery disease with DES stent placement 2009.  Chest pain with reduced exercise tolerance of 1 week duration.  Reports symptoms similar to prior requiring stent. Follows with cardiology.  Reports compliance with aspirin, statins, metoprolol.  Troponin 4 > 8.  EKG without changes. - N.p.o. midnight - Obtain echocardiogram - Cardiology consultation in a.m. -Resume aspirin, statins, metoprolol -Nitro as needed  Type 2 diabetes mellitus ([place]) _ hgbA1c - SSI- S -Hold home glipizide, metformin  HTN (hypertension) Blood pressure elevated 160s to 170s. -Continue home Norvasc, metoprolol - IV Hydralazine '5mg'$  for systolic > 99991111.  Anxiety Resume buspirone  Nonalcoholic steatohepatitis (NASH) Stable. Not decompensated.           Family Communication:  no  Family at bedside  Consultants:  cardiology  Code Status:  FULL   DVT Prophylaxis:   [place] Lovenox   Procedures: As Listed in Progress Note Above  Antibiotics: None        Subjective: Patient denies fevers, chills, headache, chest pain, dyspnea, nausea, vomiting, diarrhea, abdominal pain, dysuria, hematuria, hematochezia, and melena.   Objective: Vitals:   05/06/22 2012 05/06/22 2035 05/07/22 0052 05/07/22 0425  BP:  (!) 157/62 (!) 161/64 (!) 173/74  Pulse:  65 63 61  Resp:  '18 18 19  '$ Temp:  98 F (36.7 C) 98.6 F (37 C) 98.5 F (36.9 C)  TempSrc:  Oral Oral Oral  SpO2:  91% 93% 92%  Weight: 71.8 kg     Height: '5\' 5"'$   (1.651 m)      No intake or output data in the 24 hours ending 05/07/22 0644 Weight change:  Exam:  General:  Pt is alert, follows commands appropriately, not in acute distress HEENT: No icterus, No thrush, No neck mass, [place]/AT Cardiovascular: RRR, S1/S2, no rubs, no gallops Respiratory: bibasilar crackles.  No wheeze Abdomen: Soft/+BS, non tender, non distended, no guarding Extremities: mild RLE edema, No lymphangitis, No petechiae, No rashes, no synovitis   Data Reviewed: I have personally reviewed following labs and imaging studies Basic Metabolic Panel: Recent Labs  Lab 05/06/22 1609  NA 139  K 4.3  CL 110  CO2 22  GLUCOSE 155*  BUN 24*  CREATININE 0.87  CALCIUM 8.8*  MG 2.0   Liver Function Tests: No results for input(s): "AST", "ALT", "ALKPHOS", "BILITOT", "PROT", "ALBUMIN" in the last 168 hours. No results for input(s): "LIPASE", "AMYLASE" in the last 168 hours. No results for input(s): "AMMONIA" in the last 168 hours. Coagulation Profile: No results for input(s): "INR", "PROTIME" in the last 168 hours. CBC: Recent Labs  Lab 05/06/22 1609 05/07/22 0407  WBC 18.0* 14.0*  HGB 9.5* 9.5*  HCT 33.4* 33.9*  MCV 76.1* 76.2*  PLT 239 224   Cardiac Enzymes: No results for input(s): "CKTOTAL", "CKMB", "CKMBINDEX", "TROPONINI" in the last 168 hours. BNP: Invalid input(s): "POCBNP" CBG: Recent Labs  Lab 05/06/22 2145  GLUCAP 247*   HbA1C: No results for input(s): "HGBA1C" in the last 72 hours. Urine analysis:    Component Value Date/Time   COLORURINE STRAW (A) 05/06/2022 1830   APPEARANCEUR CLEAR 05/06/2022 1830   LABSPEC 1.010 05/06/2022 1830   PHURINE 6.0 05/06/2022 1830   GLUCOSEU NEGATIVE 05/06/2022 1830   HGBUR NEGATIVE 05/06/2022 1830   BILIRUBINUR NEGATIVE 05/06/2022 1830   KETONESUR NEGATIVE 05/06/2022 1830   PROTEINUR 30 (A) 05/06/2022 1830   UROBILINOGEN 0.2 04/04/2012 1651   NITRITE NEGATIVE 05/06/2022 1830   LEUKOCYTESUR NEGATIVE  05/06/2022 1830   Sepsis Labs: '@LABRCNTIP'$ (procalcitonin:4,lacticidven:4) )No results found for this or any previous visit (from the past 240 hour(s)).   Scheduled Meds:  amLODipine  10 mg Oral Daily   aspirin  81 mg Oral Daily   atorvastatin  40 mg Oral Daily   busPIRone  15 mg Oral TID   enoxaparin (LOVENOX) injection  40 mg Subcutaneous Q24H   insulin aspart  0-5 Units Subcutaneous QHS   insulin aspart  0-9 Units Subcutaneous TID WC   metoprolol succinate  100 mg Oral Daily   metoprolol succinate  50 mg Oral QPM   mirtazapine  45 mg Oral QHS   pantoprazole  40 mg Oral BID AC   Continuous Infusions:  Procedures/Studies: DG Chest Portable 1 View  Result Date: 05/06/2022 CLINICAL DATA:  Chest pain EXAM: PORTABLE CHEST 1 VIEW COMPARISON:  Radiograph 11/01/2020 FINDINGS: Borderline enlarged cardiac silhouette. There is no focal airspace consolidation. There is no pleural effusion or evidence of pneumothorax. Thoracic spondylosis. No acute osseous abnormality. IMPRESSION: Borderline enlarged cardiac silhouette.  No focal airspace disease. Electronically Signed   By: Maurine Simmering M.D.   On: 05/06/2022 16:36    Orson Eva, DO  [facility]  If 7PM-7AM, please contact night-coverage [email or web address]  Password [email or web address] 05/07/2022, 6:44 AM   LOS: 0 days

## 2022-05-07 NOTE — TOC Progression Note (Signed)
  Transition of Care [facility]) Screening Note   Patient Details  Name: BOSTON SCHIED Date of Birth: [birth year]   Transition of Care [facility]) CM/SW Contact:    Shade Flood, LCSW Phone Number: 05/07/2022, 10:37 AM    Transition of Care Department [facility]) has reviewed patient and no TOC needs have been identified at this time. We will continue to monitor patient advancement through interdisciplinary progression rounds. If new patient transition needs arise, please place a TOC consult.

## 2022-05-07 NOTE — Hospital Course (Addendum)
82 year old female with a history of diabetes mellitus type 2, hyperlipidemia, coronary artery disease status post DES to LAD 2009, hypertension, NASH, SVT presenting with substernal chest discomfort that began on 05/04/2022.  The patient describes a sensation as a "fluttering" and "pressure" on the evening of 05/04/2022.  She felt that it may have been related to a cortisone injection she received earlier in the day into her right shoulder for arthritis.  She stated that the chest discomfort and fluttering lasted "all night".  Over the next 2 days, the patient continued to have intermittent episodes of chest fluttering sensation, although she was not able to clarify the duration.  She stated that " it was long enough to scare me".  She had an associated sensation of shortness of breath.  She denied any dizziness, nausea, vomiting, diaphoresis.  She denies any coughing, hemoptysis, fevers, chills.  She went to see her PCP on 05/06/2022.  She brought an EKG from the office with her which shows sinus rhythm with no concerning ST-T wave changes.  She states that she has been taking 4 Aleve pills daily for the past week secondary to her arthritis pain in her shoulder.  She denies any other new medications. In the ED, the patient was afebrile and hemodynamically stable with oxygen saturation 93% on room air.  WBC 18.0, hemoglobin 9.5, platelets 239,000.  Sodium 139, potassium 4.3, bicarbonate 22, serum creatinine 0.7.  Troponin 4>> 3.  EKG shows sinus rhythm with nonspecific T wave change.  UA was negative for pyuria.  Cardiology was consulted to assist.  Later, the patient endorsed she has been having melena for last several weeks.  She has been taking daily naproxen for arthritis pain.  Labs revealed iron deficiency.  GI was consulted.  3/8 EGD--mild schatzki's ring, dilated; antral deformity/nodularity and overlying erosions, suspect NSAID injury.  She will continue protonix bid and start ferrous sulfate daily after dc.   She was instructed to stop NSAIDS

## 2022-05-07 NOTE — Consult Note (Addendum)
Cardiology Consult    Patient ID: Carmen [name] MRN: [id number], DOB/AGE: 1941-01-21   Admit date: 05/06/2022 Date of Consult: 05/07/2022  Primary Physician: [facility]ologist: Rozann Lesches, MD Requesting Provider: D. Tat, MD  Patient Profile    Carmen [name] is a 82 y.o. female with a history of CAD, hypertension, hyperlipidemia, diabetes, PSVT, GERD, and NASH, who is being seen today for the evaluation of chest pain at the request of Dr. Carles Collet.  Past Medical History   Past Medical History:  Diagnosis Date   CAD (coronary artery disease)    a. 2009 PCI/DES to LAD; b. 05/2015 MV: EF 64%, no ischemia or infarct.   Essential hypertension    GERD (gastroesophageal reflux disease)    Hyperlipidemia    Palpitations    a. 10/2018 [facility]: Predominantly sinus rhythm with symptomatic PACs, PVCs, and short run of SVT (9 beats).  Managed with beta-blocker.   Type 2 diabetes mellitus ([place])     Past Surgical History:  Procedure Laterality Date   ABDOMINAL HYSTERECTOMY     APPENDECTOMY     CATARACT EXTRACTION Left    COLONOSCOPY N/A 04/03/2015   RMR:4 polyps/mild diverticulosis/small internal hemorrhoids   COLONOSCOPY WITH ESOPHAGOGASTRODUODENOSCOPY (EGD)  02/15/2012   [id number] sessile polyps 3-8 mm found at the cecum/ 8 polyps measuring 3-8 mm in/1 polyp measuring 6 mm in transverse colon/3-56m polyps found in the sigmoid/6 mm polyp found in the rectum/small internal hemorrhoids   ESOPHAGEAL DILATION     In the 1990s   ESOPHAGEAL DILATION  02/15/2012   SLF: Stricture was found at the gastroesophageal/ Non-erosive gastritis (inflammation)Sessile polyp measuring 4 mm/duodenal mucosa showed no abnormalities    ESOPHAGOGASTRODUODENOSCOPY N/A 04/03/2015   [id number] the gastroesophageal junction/moderate nodular gastritis   KNEE SURGERY Left    SAVORY DILATION N/A 04/03/2015   Procedure: SAVORY DILATION;  Surgeon: SDanie Binder MD;   Location: [facility];  Service: Endoscopy;  Laterality: N/A;     Allergies  Allergies  Allergen Reactions   Sulfa Antibiotics Nausea And Vomiting    History of Present Illness     82y.o. female with a history of CAD, hypertension, hyperlipidemia, diabetes, PSVT, GERD, and NASH.  Cardiac history dates back to July 2009, at which time she underwent drug-eluting stent placement to the mid LAD.  She subsequently had a normal nuclear study in 2017.  In June 2020, she was evaluated with palpitations underwent event monitoring which showed brief run of SVT and symptomatic PACs/PVCs.  Beta-blocker (Toprol-XL) was increased, with subsequent further titration to 100 mg in the morning and 50 mg in the afternoon related to ongoing palpitations.  At baseline, Ms. CTuckeyis reasonably active.  She has recently started walking w/ a cane due to left leg pain, but notes that last week, [facility]  Over the past month, Ms. CDulayhas been dealing with right shoulder pain. She had a steroid injection on Monday 3/4, and that evening, noted more frequent palpitations throughout the night. Palpitations are often associated w/ chest pressure and she notes that she becomes anxious when she has palps, which may lend to longer periods of chest pressure.  Though palps/chest pressure aren't assoc w/ other symptoms, she notes that she hasn't quite felt at baseline this week (can't objectify further) and has noted some DOE.  She has cont to have intermittent palps assoc w/ chest  pressure.  Though palps are brief - just a few seconds - chest pressure might last 5-10 mins.  She mentioned her symptoms to her PCP on 3/6 and was advised to present to the ED.  Here, ECG without acute ST or T changes.  Labs notable for stable microcytic anemia (9.5/33.4), normal electrolytes and renal function, and normal troponins  4  3.  Chest x-ray with cardiomegaly and without focal airspace  disease.  No events on tele overnight.  Chest pain/pressure free this AM.  Inpatient Medications     amLODipine  10 mg Oral Daily   aspirin  81 mg Oral Daily   atorvastatin  40 mg Oral Daily   busPIRone  15 mg Oral TID   enoxaparin (LOVENOX) injection  40 mg Subcutaneous Q24H   insulin aspart  0-5 Units Subcutaneous QHS   insulin aspart  0-9 Units Subcutaneous TID WC   metoprolol succinate  100 mg Oral Daily   metoprolol succinate  50 mg Oral QPM   mirtazapine  45 mg Oral QHS   pantoprazole  40 mg Oral BID AC    Family History    Family History  Problem Relation Age of Onset   Heart attack Father    Kidney disease Mother    Diabetes Brother    Hypertension Brother    Stomach cancer Maternal Uncle    Throat cancer Paternal Uncle    Diabetes Brother    Hypertension Brother    Prostate cancer Maternal Uncle    Brain cancer Maternal Uncle    Colon cancer Neg Hx    Liver disease Neg Hx    She indicated that her mother is deceased. She indicated that her father is deceased. She indicated that both of her brothers are alive. She indicated that all of her four maternal uncles are deceased. She indicated that her paternal uncle is deceased. She indicated that the status of her neg hx is unknown.   Social History    Social History   Socioeconomic History   Marital status: Divorced    Spouse name: Not on file   Number of children: 3   Years of education: Not on file   Highest education level: Not on file  Occupational History   Not on file  Tobacco Use   Smoking status: Never   Smokeless tobacco: Never  Vaping Use   Vaping Use: Never used  Substance and Sexual Activity   Alcohol use: No    Alcohol/week: 0.0 standard drinks of alcohol   Drug use: No   Sexual activity: Not Currently  Other Topics Concern   Not on file  Social History Narrative   Not on file   Social Determinants of Health   Financial Resource Strain: Not on file  Food Insecurity: No Food  Insecurity (05/06/2022)   Hunger Vital Sign    Worried About Running Out of Food in the Last Year: Never true    Ran Out of Food in the Last Year: Never true  Transportation Needs: No Transportation Needs (05/06/2022)   PRAPARE - Hydrologist (Medical): No    Lack of Transportation (Non-Medical): No  Physical Activity: Not on file  Stress: Not on file  Social Connections: Not on file  Intimate Partner Violence: Not At Risk (05/06/2022)   Humiliation, Afraid, Rape, and Kick questionnaire    Fear of Current or Ex-Partner: No    Emotionally Abused: No    Physically Abused: No  Sexually Abused: No     Review of Systems    General:  No chills, fever, night sweats or weight changes.  Cardiovascular:  +++ Palpitations and associated chest pressure, +++ dyspnea on exertion, no edema, orthopnea, palpitations, paroxysmal nocturnal dyspnea. Dermatological: No rash, lesions/masses Respiratory: No cough, dyspnea Urologic: No hematuria, dysuria Abdominal:   No nausea, vomiting, diarrhea, bright red blood per rectum, melena, or hematemesis Neurologic:  No visual changes, wkns, changes in mental status. All other systems reviewed and are otherwise negative except as noted above.  Physical Exam    Blood pressure (!) 173/74, pulse 61, temperature 98.5 F (36.9 C), temperature source Oral, resp. rate 19, height '5\' 5"'$  (1.651 m), weight 71.8 kg, SpO2 92 %.  General: Pleasant, NAD Psych: Normal affect. Neuro: Alert and oriented X 3. Moves all extremities spontaneously. HEENT: Normal  Neck: Supple without JVD.  Soft right carotid bruit. Lungs:  Resp regular and unlabored, CTA. Heart: RRR no s3, s4, or murmurs. Abdomen: Soft, non-tender, non-distended, BS + x 4.  Extremities: No clubbing, cyanosis or edema. DP/PT2+, Radials 2+ and equal bilaterally.  Labs    Cardiac Enzymes Recent Labs  Lab 05/06/22 1609 05/06/22 1758  TROPONINIHS 4 3     BNP    Component Value  Date/Time   BNP 109.0 (H) 11/01/2020 0830    Lab Results  Component Value Date   WBC 14.0 (H) 05/07/2022   HGB 9.5 (L) 05/07/2022   HCT 33.9 (L) 05/07/2022   MCV 76.2 (L) 05/07/2022   PLT 224 05/07/2022    Recent Labs  Lab 05/06/22 1609  NA 139  K 4.3  CL 110  CO2 22  BUN 24*  CREATININE 0.87  CALCIUM 8.8*  GLUCOSE 155*    Radiology Studies    US Venous Img Lower Bilateral (DVT)  Result Date: 05/07/2022 CLINICAL DATA:  82 year old female with history of bilateral lower extremity edema. EXAM: BILATERAL LOWER EXTREMITY VENOUS DOPPLER ULTRASOUND TECHNIQUE: Gray-scale sonography with graded compression, as well as color Doppler and duplex ultrasound were performed to evaluate the lower extremity deep venous systems from the level of the common femoral vein and including the common femoral, femoral, profunda femoral, popliteal and calf veins including the posterior tibial, peroneal and gastrocnemius veins when visible. The superficial great saphenous vein was also interrogated. Spectral Doppler was utilized to evaluate flow at rest and with distal augmentation maneuvers in the common femoral, femoral and popliteal veins. COMPARISON:  None Available. FINDINGS: RIGHT LOWER EXTREMITY Common Femoral Vein: No evidence of thrombus. Normal compressibility, respiratory phasicity and response to augmentation. Saphenofemoral Junction: No evidence of thrombus. Normal compressibility and flow on color Doppler imaging. Profunda Femoral Vein: No evidence of thrombus. Normal compressibility and flow on color Doppler imaging. Femoral Vein: No evidence of thrombus. Normal compressibility, respiratory phasicity and response to augmentation. Popliteal Vein: No evidence of thrombus. Normal compressibility, respiratory phasicity and response to augmentation. Calf Veins: No evidence of thrombus. Normal compressibility and flow on color Doppler imaging. Other Findings:  None. LEFT LOWER EXTREMITY Common Femoral Vein:  No evidence of thrombus. Normal compressibility, respiratory phasicity and response to augmentation. Saphenofemoral Junction: No evidence of thrombus. Normal compressibility and flow on color Doppler imaging. Profunda Femoral Vein: No evidence of thrombus. Normal compressibility and flow on color Doppler imaging. Femoral Vein: No evidence of thrombus. Normal compressibility, respiratory phasicity and response to augmentation. Popliteal Vein: No evidence of thrombus. Normal compressibility, respiratory phasicity and response to augmentation. Calf Veins: No evidence of thrombus.  Normal compressibility and flow on color Doppler imaging. Other Findings:  None. IMPRESSION: No evidence of bilateral lower extremity deep venous thrombosis. Ruthann Cancer, MD [facility] Radiology Electronically Signed   By: Ruthann Cancer M.D.   On: 05/07/2022 11:24   DG Chest Portable 1 View  Result Date: 05/06/2022 CLINICAL DATA:  Chest pain EXAM: PORTABLE CHEST 1 VIEW COMPARISON:  Radiograph 11/01/2020 FINDINGS: Borderline enlarged cardiac silhouette. There is no focal airspace consolidation. There is no pleural effusion or evidence of pneumothorax. Thoracic spondylosis. No acute osseous abnormality. IMPRESSION: Borderline enlarged cardiac silhouette.  No focal airspace disease. Electronically Signed   By: Maurine Simmering M.D.   On: 05/06/2022 16:36    ECG & Cardiac Imaging    Regular sinus rhythm, 65, no acute ST or T changes- personally reviewed.  Assessment & Plan    1.  Precordial chest pain/CAD: Status post LAD stenting in 2009 with nonischemic Myoview in 2017.  Over the past 4 days, patient has been experiencing an increase burden of palpitations/fluttering which are associated and followed by chest pressure that may last between 5 and 10 minutes.  She is also noted some increase in baseline level of dyspnea on exertion.  For these reasons, she presented to the emergency  department on March 6.  Here, ECG is unremarkable and troponins are normal.  No events on telemetry and she is symptom-free this morning.  We will arrange for a Lexiscan Myoview to rule out ischemia and hopefully provide reassurance, as she is very anxious about her symptoms.  Continue aspirin, statin, and beta-blocker.  2.  Essential hypertension: Blood pressure elevated in the 160s to 170s since admission.  She is currently on Toprol-XL 150 mg daily as well as amlodipine 10 mg daily.  Renal function electrolytes are relatively normal.  I will add low-dose ARB therapy.  Could also consider switching metoprolol XL to carvedilol.  3.  Hyperlipidemia: On atorvastatin therapy.  No recent lipids in our system.  Will need outpatient follow-up.  4.  Palpitations/PSVT: Patient with a history of symptomatic PACs, PVCs, and brief runs of PSVT noted on monitoring in 2020.  This been managed with beta-blocker therapy.  Since steroid injection in her right shoulder on Monday of this week, she has noted an increased frequency of palpitations, which have contributed to symptoms of chest pressure.  No events on telemetry.  Continue current dose of beta-blocker.  5.  Diabetes mellitus: Insulin management per medicine team.  6.  Right carotid bruit: Noted on examination.  Follow-up carotid ultrasound-can be done as outpatient.  Continue aspirin and statin.  7.  Microcytic anemia: Stable.  Risk Assessment/Risk Scores:     TIMI Risk Score for Unstable Angina or Non-ST Elevation MI:   The patient's TIMI risk score is 4, which indicates a 20% risk of all cause mortality, new or recurrent myocardial infarction or need for urgent revascularization in the next 14 days.      Signed, Murray Hodgkins, NP 05/07/2022, 11:49 AM  For questions or updates, please contact   Please consult [email or web address] for contact info under Cardiology/STEMI.  Attending note Patient seen and discussed with NP Sharolyn Douglas, I agree with his  documentation  82 yo female history of CAD with prior DES to LAD in 2009, DM2, HTN, NASH presents with chest pain. Reports feeling of heart fluttering with some associated chest pains that have increased in frequency since recent cortisone injection in shoulder.    1.CAD/chest pain - no  objective evidence of ACS by EKG or enzymes.  - echo LVEF 60-65%, no WMAs - nucleat stress no ischemia  - extensive cardiac workup negative for any ischemic component to her symptoms - no further cardiac workup planned at this time. [place] for d/c - suspect symptomatic palpitations with some associated chest pains triggered by recent cortisone injection.   Carlyle Dolly MD

## 2022-05-07 NOTE — Care Management Obs Status (Signed)
[place] NOTIFICATION   Patient Details  Name: Carmen [name] MRN: [id number] Date of Birth: 04-26-1940   Medicare Observation Status Notification Given:  Yes    Tommy Medal 05/07/2022, 4:38 PM

## 2022-05-07 NOTE — Progress Notes (Signed)
PROGRESS NOTE  Carmen [name] [id number] DOB: 1940-09-15 DOA: 05/06/2022 PCP: [facility]  Brief History:  82 year old female with a history of diabetes mellitus type 2, hyperlipidemia, coronary artery disease status post DES to LAD 2009, hypertension, NASH, SVT presenting with substernal chest discomfort that began on 05/04/2022.  The patient describes a sensation as a "fluttering" and "pressure" on the evening of 05/04/2022.  She felt that it may have been related to a cortisone injection she received earlier in the day into her right shoulder for arthritis.  She stated that the chest discomfort and fluttering lasted "all night".  Over the next 2 days, the patient continued to have intermittent episodes of chest fluttering sensation, although she was not able to clarify the duration.  She stated that " it was long enough to scare me".  She had an associated sensation of shortness of breath.  She denied any dizziness, nausea, vomiting, diaphoresis.  She denies any coughing, hemoptysis, fevers, chills.  She went to see her PCP on 05/06/2022.  She brought an EKG from the office with her which shows sinus rhythm with no concerning ST-T wave changes.  She states that she has been taking 4 Aleve pills daily for the past week secondary to her arthritis pain in her shoulder.  She denies any other new medications. In the ED, the patient was afebrile and hemodynamically stable with oxygen saturation 93% on room air.  WBC 18.0, hemoglobin 9.5, platelets 239,000.  Sodium 139, potassium 4.3, bicarbonate 22, serum creatinine 0.7.  Troponin 4>> 3.  EKG shows sinus rhythm with nonspecific T wave change.  UA was negative for pyuria.  Cardiology was consulted to assist.  Later, the patient endorsed she has been having melena for last several weeks.  She has been taking daily naproxen for arthritis pain.  Labs revealed iron deficiency.  GI was consulted.   Assessment/Plan:   Chest  discomfort/ coronary artery disease -Patient describes it as " heart fluttering" sensation -Troponins unremarkable -Personally reviewed EKG--sinus rhythm, no concerning ST or T wave change -05/07/22 Echo EF 60-65%, no WMA, normal RVF, mild MR -Cardiology consult appreciated -NM stress test--low risk, EF 68% -Personally reviewed telemetry -did not reveal any dysrhythmia   Diabetes mellitus type 2 -NovoLog sliding scale -Holding metformin and glipizide -Hemoglobin A1c   Essential hypertension -Continue metoprolol succinate and amlodipine   History of SVT -Continue metoprolol succinate   Hyperlipidemia -Continue Lipitor   Anxiety -Continue BuSpar   Iron deficiency anemia/melena -iron sat 4%, ferritin 6% -GI consult  Leg edema -venous duplex -negative               Family Communication:  sons updated 3/7   Consultants:  cardiology   Code Status:  FULL    DVT Prophylaxis:   SCDs     Procedures: As Listed in Progress Note Above   Antibiotics: None       Subjective: Patient denies fevers, chills, headache, chest pain, dyspnea, nausea, vomiting, diarrhea, abdominal pain, dysuria, hematuria, hematochezia.  She endorses melena   Objective: Vitals:   05/06/22 2035 05/07/22 0052 05/07/22 0425 05/07/22 1445  BP: (!) 157/62 (!) 161/64 (!) 173/74 (!) 176/54  Pulse: 65 63 61 63  Resp: '18 18 19 16  '$ Temp: 98 F (36.7 C) 98.6 F (37 C) 98.5 F (36.9 C) 97.7 F (36.5 C)  TempSrc: Oral Oral Oral Oral  SpO2: 91% 93% 92% 94%  Weight:      Height:       No intake or output data in the 24 hours ending 05/07/22 1608 Weight change:  Exam:  General:  Pt is alert, follows commands appropriately, not in acute distress HEENT: No icterus, No thrush, No neck mass, [place]/AT Cardiovascular: RRR, S1/S2, no rubs, no gallops Respiratory: bibasilar crackles.  No wheeze Abdomen: Soft/+BS, non tender, non distended, no guarding Extremities: mild RLE edema, No lymphangitis, No  petechiae, No rashes, no synovitis   Data Reviewed: I have personally reviewed following labs and imaging studies Basic Metabolic Panel: Recent Labs  Lab 05/06/22 1609  NA 139  K 4.3  CL 110  CO2 22  GLUCOSE 155*  BUN 24*  CREATININE 0.87  CALCIUM 8.8*  MG 2.0   Liver Function Tests: No results for input(s): "AST", "ALT", "ALKPHOS", "BILITOT", "PROT", "ALBUMIN" in the last 168 hours. No results for input(s): "LIPASE", "AMYLASE" in the last 168 hours. No results for input(s): "AMMONIA" in the last 168 hours. Coagulation Profile: No results for input(s): "INR", "PROTIME" in the last 168 hours. CBC: Recent Labs  Lab 05/06/22 1609 05/07/22 0407  WBC 18.0* 14.0*  HGB 9.5* 9.5*  HCT 33.4* 33.9*  MCV 76.1* 76.2*  PLT 239 224   Cardiac Enzymes: No results for input(s): "CKTOTAL", "CKMB", "CKMBINDEX", "TROPONINI" in the last 168 hours. BNP: Invalid input(s): "POCBNP" CBG: Recent Labs  Lab 05/06/22 2145 05/07/22 0802 05/07/22 1450  GLUCAP 247* 146* 176*   HbA1C: No results for input(s): "HGBA1C" in the last 72 hours. Urine analysis:    Component Value Date/Time   COLORURINE STRAW (A) 05/06/2022 1830   APPEARANCEUR CLEAR 05/06/2022 1830   LABSPEC 1.010 05/06/2022 1830   PHURINE 6.0 05/06/2022 1830   GLUCOSEU NEGATIVE 05/06/2022 1830   HGBUR NEGATIVE 05/06/2022 1830   BILIRUBINUR NEGATIVE 05/06/2022 1830   KETONESUR NEGATIVE 05/06/2022 1830   PROTEINUR 30 (A) 05/06/2022 1830   UROBILINOGEN 0.2 04/04/2012 1651   NITRITE NEGATIVE 05/06/2022 1830   LEUKOCYTESUR NEGATIVE 05/06/2022 1830   Sepsis Labs: '@LABRCNTIP'$ (procalcitonin:4,lacticidven:4) )No results found for this or any previous visit (from the past 240 hour(s)).   Scheduled Meds:  amLODipine  10 mg Oral Daily   aspirin  81 mg Oral Daily   atorvastatin  40 mg Oral Daily   busPIRone  15 mg Oral TID   enoxaparin (LOVENOX) injection  40 mg Subcutaneous Q24H   [START ON 05/08/2022] ferrous sulfate  325 mg  Oral Q breakfast   insulin aspart  0-5 Units Subcutaneous QHS   insulin aspart  0-9 Units Subcutaneous TID WC   losartan  25 mg Oral Daily   metoprolol succinate  100 mg Oral Daily   metoprolol succinate  50 mg Oral QPM   mirtazapine  45 mg Oral QHS   pantoprazole  40 mg Oral BID AC   Continuous Infusions:  Procedures/Studies: NM Myocar Multi W/Spect W/Wall Motion / EF  Result Date: 05/07/2022   The study is normal. There are no perfusion defects consistent with prior infarct or current ischemia.  The study is low risk.   No ST deviation was noted.   LV perfusion is normal.   Left ventricular function is normal. Nuclear stress EF: 68 %. The left ventricular ejection fraction is hyperdynamic (>65%). End diastolic cavity size is normal.   There is a moderate size mild intensity inferior defect most intense in the resting images with normal wall motion consistent with diaphragmatic attenuation. There is a small mild  intensity mid to distal anterior defect most intense in the resting images with normal wall motion, findings are consistent with breast attenuation.   US Carotid Bilateral  Result Date: 05/07/2022 CLINICAL DATA:  Right-sided carotid bruit. History of CAD, hypertension, hyperlipidemia and diabetes. EXAM: BILATERAL CAROTID DUPLEX ULTRASOUND TECHNIQUE: Pearline Cables scale imaging, color Doppler and duplex ultrasound were performed of bilateral carotid and vertebral arteries in the neck. COMPARISON:  None Available. FINDINGS: Criteria: Quantification of carotid stenosis is based on velocity parameters that correlate the residual internal carotid diameter with NASCET-based stenosis levels, using the diameter of the distal internal carotid lumen as the denominator for stenosis measurement. The following velocity measurements were obtained: RIGHT ICA: 100/10 cm/sec CCA: 123XX123 cm/sec SYSTOLIC ICA/CCA RATIO:  1.6 ECA: 49 cm/sec LEFT ICA: 108/17 cm/sec CCA: 123456 cm/sec SYSTOLIC ICA/CCA RATIO:  1.4 ECA: 93  cm/sec RIGHT CAROTID ARTERY: There is a minimal amount of eccentric echogenic plaque within the right carotid bulb (image 16), extending to involve the origin and proximal aspects of the right internal carotid artery (image 24), not resulting in elevated peak systolic velocities within the interrogated course of the right internal carotid artery to suggest a hemodynamically significant stenosis. RIGHT VERTEBRAL ARTERY:  Antegrade flow LEFT CAROTID ARTERY: There is a moderate amount of eccentric echogenic plaque within the left carotid bulb (image 50), extending to involve the origin and proximal aspects of the left internal carotid artery (image 58, not resulting in elevated peak systolic velocities within the interrogated course of the left internal carotid artery to suggest a hemodynamically significant stenosis. LEFT VERTEBRAL ARTERY:  Antegrade flow IMPRESSION: Minimal to moderate amount of bilateral atherosclerotic plaque, left-greater-than-right, not resulting in a hemodynamically significant stenosis within either internal carotid artery. Electronically Signed   By: Sandi Mariscal M.D.   On: 05/07/2022 14:53   ECHOCARDIOGRAM COMPLETE  Result Date: 05/07/2022    ECHOCARDIOGRAM REPORT   Patient Name:   Carmen [name] Date of Exam: 05/07/2022 Medical Rec #:  [id number]         Height:       65.0 in Accession #:    [id number]        Weight:       158.2 lb Date of Birth:  1940/12/07        BSA:          1.791 m Patient Age:    25 years          BP:           173/74 mmHg Patient Gender: F                 HR:           61 bpm. Exam Location:  [facility] Procedure: 2D Echo, Cardiac Doppler and Color Doppler Indications:    Chest Pain R07.9  History:        Patient has prior history of Echocardiogram examinations, most                 recent 10/10/2018. CAD, Signs/Symptoms:Chest Pain; Risk                 Factors:Hypertension, Diabetes, Non-Smoker and Dyslipidemia.  Sonographer:    Greer Pickerel Referring Phys: [id number]  Bethena Roys  Sonographer Comments: No subcostal window. Image acquisition challenging due to patient body habitus and Image acquisition challenging due to respiratory motion. IMPRESSIONS  1. Left ventricular ejection fraction, by estimation, is 60 to 65%. The left ventricle has normal  function. The left ventricle has no regional wall motion abnormalities. There is moderate left ventricular hypertrophy. Left ventricular diastolic parameters are indeterminate. Elevated left atrial pressure.  2. Right ventricular systolic function is normal. The right ventricular size is normal. Tricuspid regurgitation signal is inadequate for assessing PA pressure.  3. Left atrial size was severely dilated.  4. The mitral valve is abnormal. Mild mitral valve regurgitation. No evidence of mitral stenosis.  5. The aortic valve has an indeterminant number of cusps. There is mild calcification of the aortic valve. There is mild thickening of the aortic valve. Aortic valve regurgitation is not visualized. No aortic stenosis is present. FINDINGS  Left Ventricle: Left ventricular ejection fraction, by estimation, is 60 to 65%. The left ventricle has normal function. The left ventricle has no regional wall motion abnormalities. The left ventricular internal cavity size was normal in size. There is  moderate left ventricular hypertrophy. Left ventricular diastolic parameters are indeterminate. Elevated left atrial pressure. Right Ventricle: The right ventricular size is normal. Right vetricular wall thickness was not well visualized. Right ventricular systolic function is normal. Tricuspid regurgitation signal is inadequate for assessing PA pressure. Left Atrium: Left atrial size was severely dilated. Right Atrium: Right atrial size was normal in size. Pericardium: There is no evidence of pericardial effusion. Mitral Valve: The mitral valve is abnormal. Mild mitral valve regurgitation. No evidence of mitral valve stenosis. Tricuspid  Valve: The tricuspid valve is normal in structure. Tricuspid valve regurgitation is trivial. No evidence of tricuspid stenosis. Aortic Valve: The aortic valve has an indeterminant number of cusps. There is mild calcification of the aortic valve. There is mild thickening of the aortic valve. There is mild aortic valve annular calcification. Aortic valve regurgitation is not visualized. No aortic stenosis is present. Aortic valve mean gradient measures 4.3 mmHg. Aortic valve peak gradient measures 8.2 mmHg. Aortic valve area, by VTI measures 2.22 cm. Pulmonic Valve: The pulmonic valve was not well visualized. Pulmonic valve regurgitation is not visualized. No evidence of pulmonic stenosis. Aorta: The aortic root and ascending aorta are structurally normal, with no evidence of dilitation. IAS/Shunts: The interatrial septum was not well visualized.  LEFT VENTRICLE PLAX 2D LVIDd:         4.30 cm   Diastology LVIDs:         2.90 cm   LV e' medial:    5.07 cm/s LV PW:         1.40 cm   LV E/e' medial:  24.5 LV IVS:        1.40 cm   LV e' lateral:   7.04 cm/s LVOT diam:     1.80 cm   LV E/e' lateral: 17.6 LV SV:         80 LV SV Index:   45 LVOT Area:     2.54 cm  RIGHT VENTRICLE RV S prime:     12.80 cm/s TAPSE (M-mode): 2.2 cm LEFT ATRIUM              Index        RIGHT ATRIUM           Index LA diam:        4.10 cm  2.29 cm/m   RA Area:     15.20 cm LA Vol (A2C):   109.0 ml 60.88 ml/m  RA Volume:   34.50 ml  19.27 ml/m LA Vol (A4C):   146.0 ml 81.54 ml/m LA Biplane Vol: 131.0 ml 73.16 ml/m  AORTIC VALVE AV Area (Vmax):    2.13 cm AV Area (Vmean):   1.79 cm AV Area (VTI):     2.22 cm AV Vmax:           143.59 cm/s AV Vmean:          99.332 cm/s AV VTI:            0.360 m AV Peak Grad:      8.2 mmHg AV Mean Grad:      4.3 mmHg LVOT Vmax:         120.00 cm/s LVOT Vmean:        70.000 cm/s LVOT VTI:          0.314 m LVOT/AV VTI ratio: 0.87  AORTA Ao Root diam: 3.50 cm Ao Asc diam:  3.40 cm MITRAL VALVE MV Area  (PHT): 4.31 cm     SHUNTS MV Decel Time: 176 msec     Systemic VTI:  0.31 m MR Peak grad: 128.4 mmHg    Systemic Diam: 1.80 cm MR Vmax:      566.50 cm/s MV E velocity: 124.00 cm/s MV A velocity: 123.00 cm/s MV E/A ratio:  1.01 Carlyle Dolly MD Electronically signed by Carlyle Dolly MD Signature Date/Time: 05/07/2022/2:18:12 PM    Final    US Venous Img Lower Bilateral (DVT)  Result Date: 05/07/2022 CLINICAL DATA:  82 year old female with history of bilateral lower extremity edema. EXAM: BILATERAL LOWER EXTREMITY VENOUS DOPPLER ULTRASOUND TECHNIQUE: Gray-scale sonography with graded compression, as well as color Doppler and duplex ultrasound were performed to evaluate the lower extremity deep venous systems from the level of the common femoral vein and including the common femoral, femoral, profunda femoral, popliteal and calf veins including the posterior tibial, peroneal and gastrocnemius veins when visible. The superficial great saphenous vein was also interrogated. Spectral Doppler was utilized to evaluate flow at rest and with distal augmentation maneuvers in the common femoral, femoral and popliteal veins. COMPARISON:  None Available. FINDINGS: RIGHT LOWER EXTREMITY Common Femoral Vein: No evidence of thrombus. Normal compressibility, respiratory phasicity and response to augmentation. Saphenofemoral Junction: No evidence of thrombus. Normal compressibility and flow on color Doppler imaging. Profunda Femoral Vein: No evidence of thrombus. Normal compressibility and flow on color Doppler imaging. Femoral Vein: No evidence of thrombus. Normal compressibility, respiratory phasicity and response to augmentation. Popliteal Vein: No evidence of thrombus. Normal compressibility, respiratory phasicity and response to augmentation. Calf Veins: No evidence of thrombus. Normal compressibility and flow on color Doppler imaging. Other Findings:  None. LEFT LOWER EXTREMITY Common Femoral Vein: No evidence of thrombus.  Normal compressibility, respiratory phasicity and response to augmentation. Saphenofemoral Junction: No evidence of thrombus. Normal compressibility and flow on color Doppler imaging. Profunda Femoral Vein: No evidence of thrombus. Normal compressibility and flow on color Doppler imaging. Femoral Vein: No evidence of thrombus. Normal compressibility, respiratory phasicity and response to augmentation. Popliteal Vein: No evidence of thrombus. Normal compressibility, respiratory phasicity and response to augmentation. Calf Veins: No evidence of thrombus. Normal compressibility and flow on color Doppler imaging. Other Findings:  None. IMPRESSION: No evidence of bilateral lower extremity deep venous thrombosis. Ruthann Cancer, MD [facility] Electronically Signed   By: Ruthann Cancer M.D.   On: 05/07/2022 11:24   DG Chest Portable 1 View  Result Date: 05/06/2022 CLINICAL DATA:  Chest pain EXAM: PORTABLE CHEST 1 VIEW COMPARISON:  Radiograph 11/01/2020 FINDINGS: Borderline enlarged cardiac silhouette. There is no focal airspace consolidation. There  is no pleural effusion or evidence of pneumothorax. Thoracic spondylosis. No acute osseous abnormality. IMPRESSION: Borderline enlarged cardiac silhouette.  No focal airspace disease. Electronically Signed   By: Maurine Simmering M.D.   On: 05/06/2022 16:36    Orson Eva, DO  [facility]  If 7PM-7AM, please contact night-coverage [email or web address] Password [email or web address] 05/07/2022, 4:08 PM   LOS: 0 days

## 2022-05-07 NOTE — Progress Notes (Signed)
  Echocardiogram 2D Echocardiogram has been performed.  Carmen [name] 05/07/2022, 9:17 AM

## 2022-05-08 ENCOUNTER — Encounter (HOSPITAL_COMMUNITY)
Admission: EM | Disposition: A | Discharge status: Home / Self Care | Payer: Self-pay | Source: Home / Self Care | Attending: Emergency Medicine

## 2022-05-08 ENCOUNTER — Telehealth: Payer: Self-pay | Admitting: Gastroenterology

## 2022-05-08 ENCOUNTER — Observation Stay (HOSPITAL_BASED_OUTPATIENT_CLINIC_OR_DEPARTMENT_OTHER): Payer: Medicare HMO | Admitting: Anesthesiology

## 2022-05-08 ENCOUNTER — Observation Stay (HOSPITAL_COMMUNITY): Payer: Medicare HMO | Admitting: Anesthesiology

## 2022-05-08 ENCOUNTER — Encounter (HOSPITAL_COMMUNITY): Payer: Self-pay | Admitting: Internal Medicine

## 2022-05-08 DIAGNOSIS — K222 Esophageal obstruction: Secondary | ICD-10-CM

## 2022-05-08 DIAGNOSIS — F419 Anxiety disorder, unspecified: Secondary | ICD-10-CM | POA: Diagnosis not present

## 2022-05-08 DIAGNOSIS — K7581 Nonalcoholic steatohepatitis (NASH): Secondary | ICD-10-CM | POA: Diagnosis not present

## 2022-05-08 DIAGNOSIS — K921 Melena: Secondary | ICD-10-CM | POA: Diagnosis not present

## 2022-05-08 DIAGNOSIS — R1319 Other dysphagia: Secondary | ICD-10-CM

## 2022-05-08 DIAGNOSIS — R079 Chest pain, unspecified: Secondary | ICD-10-CM | POA: Diagnosis not present

## 2022-05-08 DIAGNOSIS — Z794 Long term (current) use of insulin: Secondary | ICD-10-CM

## 2022-05-08 DIAGNOSIS — K449 Diaphragmatic hernia without obstruction or gangrene: Secondary | ICD-10-CM

## 2022-05-08 DIAGNOSIS — Z7984 Long term (current) use of oral hypoglycemic drugs: Secondary | ICD-10-CM

## 2022-05-08 DIAGNOSIS — E119 Type 2 diabetes mellitus without complications: Secondary | ICD-10-CM | POA: Diagnosis not present

## 2022-05-08 HISTORY — PX: MALONEY DILATION: SHX5535

## 2022-05-08 HISTORY — PX: BIOPSY: SHX5522

## 2022-05-08 HISTORY — PX: ESOPHAGOGASTRODUODENOSCOPY (EGD) WITH PROPOFOL: SHX5813

## 2022-05-08 LAB — CBC
HCT: 35.1 % — ABNORMAL LOW (ref 36.0–46.0)
Hemoglobin: 9.9 g/dL — ABNORMAL LOW (ref 12.0–15.0)
MCH: 21.2 pg — ABNORMAL LOW (ref 26.0–34.0)
MCHC: 28.2 g/dL — ABNORMAL LOW (ref 30.0–36.0)
MCV: 75.3 fL — ABNORMAL LOW (ref 80.0–100.0)
Platelets: 236 10*3/uL (ref 150–400)
RBC: 4.66 MIL/uL (ref 3.87–5.11)
RDW: 18.6 % — ABNORMAL HIGH (ref 11.5–15.5)
WBC: 14.5 10*3/uL — ABNORMAL HIGH (ref 4.0–10.5)
nRBC: 0 % (ref 0.0–0.2)

## 2022-05-08 LAB — GLUCOSE, CAPILLARY
Glucose-Capillary: 150 mg/dL — ABNORMAL HIGH (ref 70–99)
Glucose-Capillary: 149 mg/dL — ABNORMAL HIGH (ref 70–99)

## 2022-05-08 LAB — HEMOGLOBIN A1C
Hgb A1c MFr Bld: 7.6 % — ABNORMAL HIGH (ref 4.8–5.6)
Mean Plasma Glucose: 171 mg/dL

## 2022-05-08 SURGERY — ESOPHAGOGASTRODUODENOSCOPY (EGD) WITH PROPOFOL
Anesthesia: General

## 2022-05-08 MED ORDER — PROPOFOL 10 MG/ML IV BOLUS
INTRAVENOUS | Status: DC | PRN
  Administered 2022-05-08 (×2): 50 mg via INTRAVENOUS

## 2022-05-08 MED ORDER — STERILE WATER FOR IRRIGATION IR SOLN
Status: DC | PRN
  Administered 2022-05-08 (×2): 60 mL

## 2022-05-08 MED ORDER — LIDOCAINE HCL 1 % IJ SOLN
INTRAMUSCULAR | Status: DC | PRN
  Administered 2022-05-08: 50 mg via INTRADERMAL

## 2022-05-08 MED ORDER — LACTATED RINGERS IV SOLN: INTRAVENOUS | Status: DC | PRN

## 2022-05-08 NOTE — Op Note (Signed)
[facility] Patient Name: Carmen [name] Procedure Date: 05/08/2022 12:49 PM MRN: [id number] Date of Birth: 05/23/1940 Attending MD: Norvel Richards , MD, [id number] CSN: [id number] Age: 82 Admit Type: Inpatient Procedure:                Upper GI endoscopy Indications:              Dysphagia, Melena Providers:                Norvel Richards, MD, Crystal Page, Aram Candela Referring MD:              Medicines:                Propofol per Anesthesia Complications:            No immediate complications. Estimated Blood Loss:     Estimated blood loss was minimal. Procedure:                Pre-Anesthesia Assessment:                           - Prior to the procedure, a History and Physical                            was performed, and patient medications and                            allergies were reviewed. The patient's tolerance of                            previous anesthesia was also reviewed. The risks                            and benefits of the procedure and the sedation                            options and risks were discussed with the patient.                            All questions were answered, and informed consent                            was obtained. Prior Anticoagulants: The patient has                            taken no anticoagulant or antiplatelet agents. ASA                            Grade Assessment: II - A patient with mild systemic                            disease. After reviewing the risks and benefits,                            the patient was deemed in satisfactory condition to  undergo the procedure.                           After obtaining informed consent, the endoscope was                            passed under direct vision. Throughout the                            procedure, the patient's blood pressure, pulse, and                            oxygen saturations were monitored continuously. The                             [id number] scope was introduced through the                            mouth, and advanced to the second part of duodenum.                            The upper GI endoscopy was accomplished without                            difficulty. The patient tolerated the procedure                            well. Scope In: 1:12:30 PM Scope Out: 1:21:33 PM Total Procedure Duration: 0 hours 9 minutes 3 seconds  Findings:      A mild Schatzki ring was found at the gastroesophageal junction. No       esophagitis. No mass. Gastric cavity empty. The scope was withdrawn.       Dilation was performed with a Maloney dilator with mild resistance at 46       Fr. The dilation site was examined following endoscope reinsertion and       showed no change. Estimated blood loss: none.      A small hiatal hernia was present. Focal deformity of the antrum with       nodularity and overlying erosions. There examination gastric mucosa       failed to identify frank ulcer. Pylorus was patent. Examination of bulb       and second portion revealed no abnormalities This was biopsied with a       cold forceps for histology. Estimated blood loss was minimal. Impression:               - Mild Schatzki ring. Dilated. - Small hiatal                            hernia. Antral deformity/nodularity and overlying                            erosions. No frank ulcer. Status post biopsy. I                            suspect NSAID induced  injury. Moderate Sedation:      Moderate (conscious) sedation was personally administered by an       anesthesia professional. The following parameters were monitored: oxygen       saturation, heart rate, blood pressure, respiratory rate, EKG, adequacy       of pulmonary ventilation, and response to care. Recommendation:           - Patient has a contact number available for                            emergencies. The signs and symptoms of potential                             delayed complications were discussed with the                            patient. Return to normal activities tomorrow.                            Written discharge instructions were provided to the                            patient.                           - Advance diet as tolerated.                           - Continue present medications. Continue Protonix                            40 mg orally twice daily. Avoid all forms of                            NSAIDs. From a GI standpoint, could be discharged                            later today. Will plan to see her back in the                            office in 4 weeks to reassess and set up a                            surveillance colonoscopy.                           - Return to my office in 4 weeks. At patient                            request, I called Carmen [name], daughter,                            [phone]. Discussed impression and  recommendations in detail. Questions answered. Procedure Code(s):        --- Professional ---                           [phone], Esophagogastroduodenoscopy, flexible,                            transoral; with biopsy, single or multiple                           43450, Dilation of esophagus, by unguided sound or                            bougie, single or multiple passes Diagnosis Code(s):        --- Professional ---                           K22.2, Esophageal obstruction                           K44.9, Diaphragmatic hernia without obstruction or                            gangrene                           R13.10, Dysphagia, unspecified                           K92.1, Melena (includes Hematochezia) CPT copyright 2022 [employer]. All rights reserved. The codes documented in this report are preliminary and upon coder review may  be revised to meet current compliance requirements. Cristopher Estimable. Kacy Conely, MD Norvel Richards, MD 05/08/2022  1:32:27 PM This report has been signed electronically. Number of Addenda: 0

## 2022-05-08 NOTE — Telephone Encounter (Signed)
Mandy: Please arrange hospital follow-up in 4 weeks for IDA. Needs colonoscopy arranged.

## 2022-05-08 NOTE — Anesthesia Preprocedure Evaluation (Signed)
Anesthesia Evaluation  Patient identified by MRN, date of birth, ID band Patient awake    Reviewed: Allergy & Precautions, H&P , NPO status , Patient's Chart, lab work & pertinent test results, reviewed documented beta blocker date and time   Airway Mallampati: II  TM Distance: >3 FB Neck ROM: full    Dental no notable dental hx.    Pulmonary neg pulmonary ROS   Pulmonary exam normal breath sounds clear to auscultation       Cardiovascular Exercise Tolerance: Good hypertension, + CAD and + Cardiac Stents   Rhythm:regular Rate:Normal     Neuro/Psych   Anxiety     negative neurological ROS  negative psych ROS   GI/Hepatic negative GI ROS, Neg liver ROS,GERD  ,,(+) Hepatitis -  Endo/Other  negative endocrine ROSdiabetes    Renal/GU Renal diseasenegative Renal ROS  negative genitourinary   Musculoskeletal   Abdominal   Peds  Hematology negative hematology ROS (+)   Anesthesia Other Findings   The study is normal. There are no perfusion defects consistent with prior infarct or current ischemia.  The study is low risk.   No ST deviation was noted.   LV perfusion is normal.   Left ventricular function is normal. Nuclear stress EF: 68 %. The left ventricular ejection fraction is hyperdynamic (>65%). End diastolic cavity size is normal.   There is a moderate size mild intensity inferior defect most intense in the resting images with normal wall motion consistent with diaphragmatic attenuation. There is a small mild intensity mid to distal anterior defect most intense in the resting images with normal wall motion, findings are consistent with breast attenuation.     Reproductive/Obstetrics negative OB ROS                             Anesthesia Physical Anesthesia Plan  ASA: 4 and emergent  Anesthesia Plan: General   Post-op Pain Management:    Induction:   PONV Risk Score and Plan:  Propofol infusion  Airway Management Planned:   Additional Equipment:   Intra-op Plan:   Post-operative Plan:   Informed Consent: I have reviewed the patients History and Physical, chart, labs and discussed the procedure including the risks, benefits and alternatives for the proposed anesthesia with the patient or authorized representative who has indicated his/her understanding and acceptance.     Dental Advisory Given  Plan Discussed with: CRNA  Anesthesia Plan Comments:        Anesthesia Quick Evaluation

## 2022-05-08 NOTE — Consult Note (Addendum)
$'@LOGO'n$ @   Referring Provider: Dr. Carles Collet Primary Care Physician:  [facility] Primary Gastroenterologist:  Dr. Oneida Alar previously.   Date of Admission: 05/06/22 Date of Consultation: 05/08/22  Reason for Consultation:  IDA, melena  HPI:  Carmen [name] is a 82 y.o. year old female with history of CAD s/p PCI, PACs, HTN, HLD, type 2 diabetes, GERD, adenomatous colon polyps, who presented to the emergency room 3/6 due to mild chest pressure, "fluttering" in her chest x 1 week.  She was admitted for workup of chest pain.  In the ED, the patient was afebrile and hemodynamically stable with oxygen saturation 93% on room air. WBC 18.0, hemoglobin 9.5   Cardiology was consulted.  ECG  without significant abnormality, troponins normal.  Echocardiogram with normal EF, moderate left ventricular hypertrophy, dilated left atrium, mild mitral regurgitation, mild calcification of aortic valve.  Lexiscan also completed with normal study.  Bilateral carotid ultrasound with minimal to moderate amount of bilateral atherosclerotic plaque, left greater than right without hemodynamically significant stenosis.  Later, patient endorsed that she had been having melena for the last several weeks.  She was found to have iron deficiency with ferritin 6, iron 19, saturation 4%.  B12 low normal.  Folate normal.    Today: Reports her stools have been darker than normal for the last few days.  No history of melena in the past.  Chronically on pantoprazole 40 mg twice daily for GERD which is well-controlled.  Denies dysphagia, abdominal pain, nausea, vomiting, bright red blood per rectum.  No change in bowel habits.  No unintentional weight loss.  She has not been taking iron or Pepto-Bismol at home. 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  Reports her chest pressure and "fluttering" have resolved.  No shortness of breath.  Thinks it  was from the cortisone injection that she had a few days ago.  States she is really wanting to go home today.    Per chart review, patient's hemoglobin has been in the 10-11 range since 2018.   Last colonoscopy February 2017: 4 polyps removed, mild sigmoid diverticulosis, small internal hemorrhoids, single AVM at hepatic flexure.  Pathology with tubular adenomas.  Hemoglobin has remained stable, 9.9 this morning.  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  Pathology with fundic gland polyps, gastric biopsy negative for H. pylori.  Past Medical History:  Diagnosis Date   CAD (coronary artery disease)    a. 2009 PCI/DES to LAD; b. 05/2015 MV: EF 64%, no ischemia or infarct.   Essential hypertension    GERD (gastroesophageal reflux disease)    Hyperlipidemia    Palpitations    a. 10/2018 [facility]: Predominantly sinus rhythm with symptomatic PACs, PVCs, and short run of SVT (9 beats).  Managed with beta-blocker.   Type 2 diabetes mellitus ([facility])     Past Surgical History:  Procedure Laterality Date   ABDOMINAL HYSTERECTOMY     APPENDECTOMY     CATARACT EXTRACTION Left    COLONOSCOPY N/A 04/03/2015   RMR:4 polyps/mild diverticulosis/small internal hemorrhoids   COLONOSCOPY WITH ESOPHAGOGASTRODUODENOSCOPY (EGD)  02/15/2012   [id number] sessile polyps 3-8 mm found at the cecum/ 8 polyps measuring 3-8 mm in/1 polyp measuring 6 mm in transverse colon/3-33m polyps found in the sigmoid/6 mm polyp found in the rectum/small internal hemorrhoids   ESOPHAGEAL DILATION  In the [place]  02/15/2012   SLF: Stricture was found at the gastroesophageal/ Non-erosive gastritis (inflammation)Sessile polyp measuring 4 mm/duodenal mucosa showed no abnormalities    ESOPHAGOGASTRODUODENOSCOPY N/A 04/03/2015   [id number] at the gastroesophageal junction/moderate nodular gastritis   KNEE SURGERY Left    SAVORY  DILATION N/A 04/03/2015   Procedure: SAVORY DILATION;  Surgeon: Danie Binder, MD;  Location: [facility];  Service: Endoscopy;  Laterality: N/A;    Prior to Admission medications   Medication Sig Start Date End Date Taking? Authorizing Provider  amLODipine (NORVASC) 10 MG tablet Take 10 mg by mouth daily.   Yes [provider]  aspirin 81 MG chewable tablet Chew 81 mg by mouth daily.   Yes [provider]  atorvastatin (LIPITOR) 40 MG tablet Take 1 tablet by mouth daily. 05/01/17  Yes [provider]  busPIRone (BUSPAR) 15 MG tablet Take 15 mg by mouth 3 (three) times daily. Take 1/2 tablet daily   Yes [provider]  cephALEXin (KEFLEX) 250 MG capsule Take 250 mg by mouth at bedtime.   Yes [provider]  glipiZIDE (GLUCOTROL) 5 MG tablet Take 5 mg by mouth 2 (two) times daily before a meal.   Yes [provider]  LANTUS SOLOSTAR 100 UNIT/ML Solostar Pen Inject 10 Units into the skin daily as needed (Blood sugar). 08/18/21  Yes [provider]  metFORMIN (GLUCOPHAGE) 500 MG tablet Take 500 mg by mouth 2 (two) times daily with a meal.   Yes [provider]  metoprolol succinate (TOPROL XL) 50 MG 24 hr tablet Take 1 tablet (50 mg total) by mouth every evening. Take with or immediately following a meal. 09/26/19  Yes Verta Ellen., NP  metoprolol succinate (TOPROL-XL) 100 MG 24 hr tablet Take 1 tablet (100 mg total) by mouth daily. 11/26/20  Yes Verta Ellen., NP  mirtazapine (REMERON) 45 MG tablet Take 45 mg by mouth at bedtime.   Yes [provider]  Multiple Vitamin (MULTIVITAMIN WITH MINERALS) TABS tablet Take 1 tablet by mouth daily.   Yes [provider]  pantoprazole (PROTONIX) 40 MG tablet TAKE 1 TABLET BY MOUTH TWICE DAILY. TAKE 30 MINS PRIOR TO MEALS Patient taking differently: Take 40 mg by mouth 2 (two) times daily before a meal. 07/06/13  Yes Annitta Needs, NP  ferrous sulfate 325 (65  FE) MG tablet Take 1 tablet (325 mg total) by mouth daily with breakfast. 05/08/22   Tat, Shanon Brow, MD  losartan (COZAAR) 25 MG tablet Take 1 tablet (25 mg total) by mouth daily. 05/07/22   Orson Eva, MD    Current Facility-Administered Medications  Medication Dose Route Frequency Provider Last Rate Last Admin   acetaminophen (TYLENOL) tablet 650 mg  650 mg Oral Q6H PRN Emokpae, Ejiroghene E, MD       Or   acetaminophen (TYLENOL) suppository 650 mg  650 mg Rectal Q6H PRN Emokpae, Ejiroghene E, MD       amLODipine (NORVASC) tablet 10 mg  10 mg Oral Daily Emokpae, Ejiroghene E, MD   10 mg at 05/08/22 0814   aspirin chewable tablet 81 mg  81 mg Oral Daily Emokpae, Ejiroghene E, MD   81 mg at 05/08/22 0813   atorvastatin (LIPITOR) tablet 40 mg  40 mg Oral Daily Emokpae, Ejiroghene E, MD   40 mg at 05/08/22 0812   busPIRone (BUSPAR) tablet 15 mg  15 mg Oral TID Emokpae, Ejiroghene E,  MD   15 mg at 05/08/22 0815   enoxaparin (LOVENOX) injection 40 mg  40 mg Subcutaneous Q24H Emokpae, Ejiroghene E, MD   40 mg at 05/06/22 2243   ferrous sulfate tablet 325 mg  325 mg Oral Q breakfast Tat, David, MD   325 mg at 05/08/22 0813   hydrALAZINE (APRESOLINE) injection 5 mg  5 mg Intravenous Q4H PRN Emokpae, Ejiroghene E, MD       insulin aspart (novoLOG) injection 0-5 Units  0-5 Units Subcutaneous QHS Emokpae, Ejiroghene E, MD   2 Units at 05/06/22 2244   insulin aspart (novoLOG) injection 0-9 Units  0-9 Units Subcutaneous TID WC Emokpae, Ejiroghene E, MD   1 Units at 05/08/22 0815   losartan (COZAAR) tablet 25 mg  25 mg Oral Daily Theora Gianotti, NP   25 mg at 05/08/22 0813   metoprolol succinate (TOPROL-XL) 24 hr tablet 100 mg  100 mg Oral Daily Emokpae, Ejiroghene E, MD   100 mg at 05/08/22 [id number]   metoprolol succinate (TOPROL-XL) 24 hr tablet 50 mg  50 mg Oral QPM Emokpae, Ejiroghene E, MD   50 mg at 05/07/22 1816   mirtazapine (REMERON) tablet 45 mg  45 mg Oral QHS Emokpae, Ejiroghene E, MD   45 mg at  05/07/22 2122   nitroGLYCERIN (NITROSTAT) SL tablet 0.4 mg  0.4 mg Sublingual Q5 min PRN Emokpae, Ejiroghene E, MD   0.4 mg at 05/06/22 1657   ondansetron (ZOFRAN) tablet 4 mg  4 mg Oral Q6H PRN Emokpae, Ejiroghene E, MD       Or   ondansetron (ZOFRAN) injection 4 mg  4 mg Intravenous Q6H PRN Emokpae, Ejiroghene E, MD       pantoprazole (PROTONIX) EC tablet 40 mg  40 mg Oral BID AC Emokpae, Ejiroghene E, MD   40 mg at 05/08/22 0814   polyethylene glycol (MIRALAX / GLYCOLAX) packet 17 g  17 g Oral Daily PRN Emokpae, Ejiroghene E, MD        Allergies as of 05/06/2022 - Review Complete 05/06/2022  Allergen Reaction Noted   Sulfa antibiotics Nausea And Vomiting 11/30/2014    Family History  Problem Relation Age of Onset   Heart attack Father    Kidney disease Mother    Diabetes Brother    Hypertension Brother    Stomach cancer Maternal Uncle    Throat cancer Paternal Uncle    Diabetes Brother    Hypertension Brother    Prostate cancer Maternal Uncle    Brain cancer Maternal Uncle    Colon cancer Neg Hx    Liver disease Neg Hx     Social History   Socioeconomic History   Marital status: Divorced    Spouse name: Not on file   Number of children: 3   Years of education: Not on file   Highest education level: Not on file  Occupational History   Not on file  Tobacco Use   Smoking status: Never   Smokeless tobacco: Never  Vaping Use   Vaping Use: Never used  Substance and Sexual Activity   Alcohol use: No    Alcohol/week: 0.0 standard drinks of alcohol   Drug use: No   Sexual activity: Not Currently  Other Topics Concern   Not on file  Social History Narrative   Not on file   Social Determinants of Health   Financial Resource Strain: Not on file  Food Insecurity: No Food Insecurity (05/06/2022)   Hunger Vital Sign  Worried About Charity fundraiser in the Last Year: Never true    Browning in the Last Year: Never true  Transportation Needs: No Transportation  Needs (05/06/2022)   PRAPARE - Hydrologist (Medical): No    Lack of Transportation (Non-Medical): No  Physical Activity: Not on file  Stress: Not on file  Social Connections: Not on file  Intimate Partner Violence: Not At Risk (05/06/2022)   Humiliation, Afraid, Rape, and Kick questionnaire    Fear of Current or Ex-Partner: No    Emotionally Abused: No    Physically Abused: No    Sexually Abused: No    Review of Systems: Gen: Denies fever, chills, cold or flulike symptoms, presyncope, syncope. CV: Denies chest pain, heart palpitations. Resp: Denies shortness of breath, cough. GI: See HPI GU : Denies urinary burning, urinary frequency, urinary incontinence.  MS: Admits to pain in her right shoulder. Derm: Denies rash. Psych: Denies depression, anxiety. Heme: See HPI  Physical Exam: Vital signs in last 24 hours: Temp:  [97.7 F (36.5 C)-98.8 F (37.1 C)] 98.8 F (37.1 C) (03/08 0351) Pulse Rate:  [59-65] 59 (03/08 0351) Resp:  [16-18] 18 (03/08 0351) BP: (140-181)/(52-75) 140/52 (03/08 0351) SpO2:  [90 %-94 %] 90 % (03/08 0351) Last BM Date : 05/05/22 General:   Alert,  Well-developed, well-nourished, pleasant and cooperative in NAD Head:  Normocephalic and atraumatic. Eyes:  Sclera clear, no icterus.   Conjunctiva pink. Ears:  Normal auditory acuity. Lungs: Few crackles in the lung bases.  Heart:  Regular rate and rhythm; no murmurs, clicks, rubs,  or gallops. Abdomen:  Soft, nontender and nondistended. No masses, hepatosplenomegaly or hernias noted. Normal bowel sounds, without guarding, and without rebound.   Rectal:  Deferred Msk:  Symmetrical without gross deformities. Normal posture. Pulses:  Normal pulses noted. Extremities:  Without edema Neurologic:  Alert and  oriented x4;  grossly normal neurologically. Skin:  Intact without significant lesions or rashes. Psych: . Normal mood and affect.  Intake/Output from previous day: No  intake/output data recorded. Intake/Output this shift: No intake/output data recorded.  Lab Results: Recent Labs    05/06/22 1609 05/07/22 0407 05/08/22 0416  WBC 18.0* 14.0* 14.5*  HGB 9.5* 9.5* 9.9*  HCT 33.4* 33.9* 35.1*  PLT 239 224 236   BMET Recent Labs    05/06/22 1609  NA 139  K 4.3  CL 110  CO2 22  GLUCOSE 155*  BUN 24*  CREATININE 0.87  CALCIUM 8.8*   LFT No results for input(s): "PROT", "ALBUMIN", "AST", "ALT", "ALKPHOS", "BILITOT", "BILIDIR", "IBILI" in the last 72 hours. PT/INR No results for input(s): "LABPROT", "INR" in the last 72 hours. Hepatitis Panel No results for input(s): "HEPBSAG", "HCVAB", "HEPAIGM", "HEPBIGM" in the last 72 hours. C-Diff No results for input(s): "CDIFFTOX" in the last 72 hours.  Studies/Results: NM Myocar Multi W/Spect W/Wall Motion / EF  Result Date: 05/07/2022   The study is normal. There are no perfusion defects consistent with prior infarct or current ischemia.  The study is low risk.   No ST deviation was noted.   LV perfusion is normal.   Left ventricular function is normal. Nuclear stress EF: 68 %. The left ventricular ejection fraction is hyperdynamic (>65%). End diastolic cavity size is normal.   There is a moderate size mild intensity inferior defect most intense in the resting images with normal wall motion consistent with diaphragmatic attenuation. There is a small mild intensity  mid to distal anterior defect most intense in the resting images with normal wall motion, findings are consistent with breast attenuation.   US Carotid Bilateral  Result Date: 05/07/2022 CLINICAL DATA:  Right-sided carotid bruit. History of CAD, hypertension, hyperlipidemia and diabetes. EXAM: BILATERAL CAROTID DUPLEX ULTRASOUND TECHNIQUE: Pearline Cables scale imaging, color Doppler and duplex ultrasound were performed of bilateral carotid and vertebral arteries in the neck. COMPARISON:  None Available. FINDINGS: Criteria: Quantification of carotid  stenosis is based on velocity parameters that correlate the residual internal carotid diameter with NASCET-based stenosis levels, using the diameter of the distal internal carotid lumen as the denominator for stenosis measurement. The following velocity measurements were obtained: RIGHT ICA: 100/10 cm/sec CCA: 123XX123 cm/sec SYSTOLIC ICA/CCA RATIO:  1.6 ECA: 49 cm/sec LEFT ICA: 108/17 cm/sec CCA: [id number] cm/sec SYSTOLIC ICA/CCA RATIO:  1.4 ECA: 93 cm/sec RIGHT CAROTID ARTERY: There is a minimal amount of eccentric echogenic plaque within the right carotid bulb (image 16), extending to involve the origin and proximal aspects of the right internal carotid artery (image 24), not resulting in elevated peak systolic velocities within the interrogated course of the right internal carotid artery to suggest a hemodynamically significant stenosis. RIGHT VERTEBRAL ARTERY:  Antegrade flow LEFT CAROTID ARTERY: There is a moderate amount of eccentric echogenic plaque within the left carotid bulb (image 50), extending to involve the origin and proximal aspects of the left internal carotid artery (image 58, not resulting in elevated peak systolic velocities within the interrogated course of the left internal carotid artery to suggest a hemodynamically significant stenosis. LEFT VERTEBRAL ARTERY:  Antegrade flow IMPRESSION: Minimal to moderate amount of bilateral atherosclerotic plaque, left-greater-than-right, not resulting in a hemodynamically significant stenosis within either internal carotid artery. Electronically Signed   By: Sandi Mariscal M.D.   On: 05/07/2022 14:53   ECHOCARDIOGRAM COMPLETE  Result Date: 05/07/2022    ECHOCARDIOGRAM REPORT   Patient Name:   Carmen [name] Schleicher Date of Exam: 05/07/2022 Medical Rec #:  [id number]         Height:       65.0 in Accession #:    [id number]        Weight:       158.2 lb Date of Birth:  06-09-1940        BSA:          1.791 m Patient Age:    81 years          BP:           173/74 mmHg  Patient Gender: F                 HR:           61 bpm. Exam Location:  [facility] Procedure: 2D Echo, Cardiac Doppler and Color Doppler Indications:    Chest Pain R07.9  History:        Patient has prior history of Echocardiogram examinations, most                 recent 10/10/2018. CAD, Signs/Symptoms:Chest Pain; Risk                 Factors:Hypertension, Diabetes, Non-Smoker and Dyslipidemia.  Sonographer:    Greer Pickerel Referring Phys: [id number] Bethena Roys  Sonographer Comments: No subcostal window. Image acquisition challenging due to patient body habitus and Image acquisition challenging due to respiratory motion. IMPRESSIONS  1. Left ventricular ejection fraction, by estimation, is 60 to 65%. The left ventricle has normal function.  The left ventricle has no regional wall motion abnormalities. There is moderate left ventricular hypertrophy. Left ventricular diastolic parameters are indeterminate. Elevated left atrial pressure.  2. Right ventricular systolic function is normal. The right ventricular size is normal. Tricuspid regurgitation signal is inadequate for assessing PA pressure.  3. Left atrial size was severely dilated.  4. The mitral valve is abnormal. Mild mitral valve regurgitation. No evidence of mitral stenosis.  5. The aortic valve has an indeterminant number of cusps. There is mild calcification of the aortic valve. There is mild thickening of the aortic valve. Aortic valve regurgitation is not visualized. No aortic stenosis is present. FINDINGS  Left Ventricle: Left ventricular ejection fraction, by estimation, is 60 to 65%. The left ventricle has normal function. The left ventricle has no regional wall motion abnormalities. The left ventricular internal cavity size was normal in size. There is  moderate left ventricular hypertrophy. Left ventricular diastolic parameters are indeterminate. Elevated left atrial pressure. Right Ventricle: The right ventricular size is normal. Right vetricular  wall thickness was not well visualized. Right ventricular systolic function is normal. Tricuspid regurgitation signal is inadequate for assessing PA pressure. Left Atrium: Left atrial size was severely dilated. Right Atrium: Right atrial size was normal in size. Pericardium: There is no evidence of pericardial effusion. Mitral Valve: The mitral valve is abnormal. Mild mitral valve regurgitation. No evidence of mitral valve stenosis. Tricuspid Valve: The tricuspid valve is normal in structure. Tricuspid valve regurgitation is trivial. No evidence of tricuspid stenosis. Aortic Valve: The aortic valve has an indeterminant number of cusps. There is mild calcification of the aortic valve. There is mild thickening of the aortic valve. There is mild aortic valve annular calcification. Aortic valve regurgitation is not visualized. No aortic stenosis is present. Aortic valve mean gradient measures 4.3 mmHg. Aortic valve peak gradient measures 8.2 mmHg. Aortic valve area, by VTI measures 2.22 cm. Pulmonic Valve: The pulmonic valve was not well visualized. Pulmonic valve regurgitation is not visualized. No evidence of pulmonic stenosis. Aorta: The aortic root and ascending aorta are structurally normal, with no evidence of dilitation. IAS/Shunts: The interatrial septum was not well visualized.  LEFT VENTRICLE PLAX 2D LVIDd:         4.30 cm   Diastology LVIDs:         2.90 cm   LV e' medial:    5.07 cm/s LV PW:         1.40 cm   LV E/e' medial:  24.5 LV IVS:        1.40 cm   LV e' lateral:   7.04 cm/s LVOT diam:     1.80 cm   LV E/e' lateral: 17.6 LV SV:         80 LV SV Index:   45 LVOT Area:     2.54 cm  RIGHT VENTRICLE RV S prime:     12.80 cm/s TAPSE (M-mode): 2.2 cm LEFT ATRIUM              Index        RIGHT ATRIUM           Index LA diam:        4.10 cm  2.29 cm/m   RA Area:     15.20 cm LA Vol (A2C):   109.0 ml 60.88 ml/m  RA Volume:   34.50 ml  19.27 ml/m LA Vol (A4C):   146.0 ml 81.54 ml/m LA Biplane Vol: 131.0  ml 73.16 ml/m  AORTIC VALVE AV Area (Vmax):    2.13 cm AV Area (Vmean):   1.79 cm AV Area (VTI):     2.22 cm AV Vmax:           143.59 cm/s AV Vmean:          99.332 cm/s AV VTI:            0.360 m AV Peak Grad:      8.2 mmHg AV Mean Grad:      4.3 mmHg LVOT Vmax:         120.00 cm/s LVOT Vmean:        70.000 cm/s LVOT VTI:          0.314 m LVOT/AV VTI ratio: 0.87  AORTA Ao Root diam: 3.50 cm Ao Asc diam:  3.40 cm MITRAL VALVE MV Area (PHT): 4.31 cm     SHUNTS MV Decel Time: 176 msec     Systemic VTI:  0.31 m MR Peak grad: 128.4 mmHg    Systemic Diam: 1.80 cm MR Vmax:      566.50 cm/s MV E velocity: 124.00 cm/s MV A velocity: 123.00 cm/s MV E/A ratio:  1.01 Carlyle Dolly MD Electronically signed by Carlyle Dolly MD Signature Date/Time: 05/07/2022/2:18:12 PM    Final    US Venous Img Lower Bilateral (DVT)  Result Date: 05/07/2022 CLINICAL DATA:  82 year old female with history of bilateral lower extremity edema. EXAM: BILATERAL LOWER EXTREMITY VENOUS DOPPLER ULTRASOUND TECHNIQUE: Gray-scale sonography with graded compression, as well as color Doppler and duplex ultrasound were performed to evaluate the lower extremity deep venous systems from the level of the common femoral vein and including the common femoral, femoral, profunda femoral, popliteal and calf veins including the posterior tibial, peroneal and gastrocnemius veins when visible. The superficial great saphenous vein was also interrogated. Spectral Doppler was utilized to evaluate flow at rest and with distal augmentation maneuvers in the common femoral, femoral and popliteal veins. COMPARISON:  None Available. FINDINGS: RIGHT LOWER EXTREMITY Common Femoral Vein: No evidence of thrombus. Normal compressibility, respiratory phasicity and response to augmentation. Saphenofemoral Junction: No evidence of thrombus. Normal compressibility and flow on color Doppler imaging. Profunda Femoral Vein: No evidence of thrombus. Normal compressibility and flow  on color Doppler imaging. Femoral Vein: No evidence of thrombus. Normal compressibility, respiratory phasicity and response to augmentation. Popliteal Vein: No evidence of thrombus. Normal compressibility, respiratory phasicity and response to augmentation. Calf Veins: No evidence of thrombus. Normal compressibility and flow on color Doppler imaging. Other Findings:  None. LEFT LOWER EXTREMITY Common Femoral Vein: No evidence of thrombus. Normal compressibility, respiratory phasicity and response to augmentation. Saphenofemoral Junction: No evidence of thrombus. Normal compressibility and flow on color Doppler imaging. Profunda Femoral Vein: No evidence of thrombus. Normal compressibility and flow on color Doppler imaging. Femoral Vein: No evidence of thrombus. Normal compressibility, respiratory phasicity and response to augmentation. Popliteal Vein: No evidence of thrombus. Normal compressibility, respiratory phasicity and response to augmentation. Calf Veins: No evidence of thrombus. Normal compressibility and flow on color Doppler imaging. Other Findings:  None. IMPRESSION: No evidence of bilateral lower extremity deep venous thrombosis. Ruthann Cancer, MD [facility] Electronically Signed   By: Ruthann Cancer M.D.   On: 05/07/2022 11:24   DG Chest Portable 1 View  Result Date: 05/06/2022 CLINICAL DATA:  Chest pain EXAM: PORTABLE CHEST 1 VIEW COMPARISON:  Radiograph 11/01/2020 FINDINGS: Borderline enlarged cardiac silhouette. There is no focal airspace consolidation. There is  no pleural effusion or evidence of pneumothorax. Thoracic spondylosis. No acute osseous abnormality. IMPRESSION: Borderline enlarged cardiac silhouette.  No focal airspace disease. Electronically Signed   By: Maurine Simmering M.D.   On: 05/06/2022 16:36    Impression: 82 y.o. year old female with history of CAD s/p PCI, PACs, HTN, HLD, type 2 diabetes, GERD, adenomatous colon polyps,  who presented to the emergency room 3/6 due to mild chest pressure, "fluttering" in her chest x 1 week.  She was admitted for workup of chest pain.  Cardiology was consulted and her workup for chest pain has been benign, now with resolution of chest pain and "fluttering".  Patient later endorsed that she had been having melena, found to have iron deficiency anemia, and GI consulted for further evaluation.  IDA: Patient has had mild anemia with hemoglobin in the 10-11 range since 2018.  This admission, hemoglobin 9.5 with evidence of iron deficiency.  She reports melena for the last few days and also admits to taking 4-6 naproxen daily for the last 3 weeks due to right shoulder arthritis.  Chronically on PPI twice daily which keeps GERD symptoms well-controlled.  No other significant GI symptoms.  Hemoglobin has been entirely stable this admission, 9.9 this morning.  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  In the setting of daily naproxen, she is at risk for peptic ulcer disease.  Recommend updating EGD at this time.  If unremarkable, would consider updating colonoscopy as an outpatient as her hemoglobin has been entirely stable and patient desires to go home today if possible.    Plan: Proceed with upper endoscopy with propofol by Dr. Gala Romney. The risks, benefits, and alternatives have been discussed with the patient in detail. The patient states understanding and desires to proceed.  Continue to monitor H/H and for overt GI bleeding.  Continue Protonix 40 mg BID.  Avoid NSAIDs Continue ferrous sulfate daily.  Outpatient colonoscopy.    LOS: 0 days    05/08/2022, 8:33 AM   Aliene Altes, PA-C [facility]

## 2022-05-08 NOTE — Transfer of Care (Signed)
Immediate Anesthesia Transfer of Care Note  Patient: Carmen [name]  Procedure(s) Performed: ESOPHAGOGASTRODUODENOSCOPY (EGD) WITH PROPOFOL [place]  Patient Location: PACU  Anesthesia Type:General  Level of Consciousness: awake  Airway & Oxygen Therapy: Patient Spontanous Breathing  Post-op Assessment: Report given to RN  Post vital signs: Reviewed and stable  Last Vitals:  Vitals Value Taken Time  BP    Temp    Pulse 63 05/08/22 1327  Resp 21 05/08/22 1327  SpO2 99 % 05/08/22 1327  Vitals shown include unvalidated device data.  Last Pain:  Vitals:   05/08/22 1307  TempSrc:   PainSc: 0-No pain         Complications: No notable events documented.

## 2022-05-08 NOTE — Discharge Summary (Signed)
Physician Discharge Summary   Patient: Carmen [name] MRN: [id number] DOB: [birth year]  Admit date:     05/06/2022  Discharge date: 05/08/22  Discharge Physician: Shanon Brow Douglas Smolinsky   PCP: [facility]   Recommendations at discharge:   Please follow up with primary care provider within 1-2 weeks  Please repeat BMP and CBC in one week     Hospital Course: 82 year old female with a history of diabetes mellitus type 2, hyperlipidemia, coronary artery disease status post DES to LAD 2009, hypertension, NASH, SVT presenting with substernal chest discomfort that began on 05/04/2022.  The patient describes a sensation as a "fluttering" and "pressure" on the evening of 05/04/2022.  She felt that it may have been related to a cortisone injection she received earlier in the day into her right shoulder for arthritis.  She stated that the chest discomfort and fluttering lasted "all night".  Over the next 2 days, the patient continued to have intermittent episodes of chest fluttering sensation, although she was not able to clarify the duration.  She stated that " it was long enough to scare me".  She had an associated sensation of shortness of breath.  She denied any dizziness, nausea, vomiting, diaphoresis.  She denies any coughing, hemoptysis, fevers, chills.  She went to see her PCP on 05/06/2022.  She brought an EKG from the office with her which shows sinus rhythm with no concerning ST-T wave changes.  She states that she has been taking 4 Aleve pills daily for the past week secondary to her arthritis pain in her shoulder.  She denies any other new medications. In the ED, the patient was afebrile and hemodynamically stable with oxygen saturation 93% on room air.  WBC 18.0, hemoglobin 9.5, platelets 239,000.  Sodium 139, potassium 4.3, bicarbonate 22, serum creatinine 0.7.  Troponin 4>> 3.  EKG shows sinus rhythm with nonspecific T wave change.  UA was negative for pyuria.  Cardiology was  consulted to assist.  Later, the patient endorsed she has been having melena for last several weeks.  She has been taking daily naproxen for arthritis pain.  Labs revealed iron deficiency.  GI was consulted.  3/8 EGD--mild schatzki's ring, dilated; antral deformity/nodularity and overlying erosions, suspect NSAID injury.  She will continue protonix bid and start ferrous sulfate daily after dc.  She was instructed to stop NSAIDS  Assessment and Plan: Chest discomfort/ coronary artery disease -Patient describes it as " heart fluttering" sensation -Troponins unremarkable -Personally reviewed EKG--sinus rhythm, no concerning ST or T wave change -05/07/22 Echo EF 60-65%, no WMA, normal RVF, mild MR -Cardiology consult appreciated -NM stress test--low risk, EF 68% -Personally reviewed telemetry -did not reveal any dysrhythmia   Diabetes mellitus type 2 -NovoLog sliding scale -Holding metformin and glipizide -3/6 Hemoglobin A1c--7.6   Essential hypertension -Continue metoprolol succinate and amlodipine   History of SVT -Continue metoprolol succinate   Hyperlipidemia -Continue Lipitor   Anxiety -Continue BuSpar   Iron deficiency anemia/melena -iron sat 4%, ferritin 6% -GI consult appreciated -3/8 EGD--mild schatzki's ring, dilated; antral deformity/nodularity and overlying erosions, suspect NSAID injury -d/c home with ferrous sulfate daily -avoid NSAIDS -protonix bid   Leg edema -venous duplex -negative         Consultants: GI, cardiology Procedures performed: EGD  Disposition: Home Diet recommendation:  Carb modified diet DISCHARGE MEDICATION: Allergies as of 05/08/2022       Reactions   Sulfa Antibiotics Nausea And Vomiting  Medication List     STOP taking these medications    cephALEXin 250 MG capsule Commonly known as: KEFLEX       TAKE these medications    amLODipine 10 MG tablet Commonly known as: NORVASC Take 10 mg by mouth daily.    aspirin 81 MG chewable tablet Chew 81 mg by mouth daily.   atorvastatin 40 MG tablet Commonly known as: LIPITOR Take 1 tablet by mouth daily.   busPIRone 15 MG tablet Commonly known as: BUSPAR Take 15 mg by mouth 3 (three) times daily. Take 1/2 tablet daily   ferrous sulfate 325 (65 FE) MG tablet Take 1 tablet (325 mg total) by mouth daily with breakfast.   glipiZIDE 5 MG tablet Commonly known as: GLUCOTROL Take 5 mg by mouth 2 (two) times daily before a meal.   Lantus SoloStar 100 UNIT/ML Solostar Pen Generic drug: insulin glargine Inject 10 Units into the skin daily as needed (Blood sugar).   losartan 25 MG tablet Commonly known as: COZAAR Take 1 tablet (25 mg total) by mouth daily.   metFORMIN 500 MG tablet Commonly known as: GLUCOPHAGE Take 500 mg by mouth 2 (two) times daily with a meal.   metoprolol succinate 50 MG 24 hr tablet Commonly known as: Toprol XL Take 1 tablet (50 mg total) by mouth every evening. Take with or immediately following a meal.   metoprolol succinate 100 MG 24 hr tablet Commonly known as: TOPROL-XL Take 1 tablet (100 mg total) by mouth daily.   mirtazapine 45 MG tablet Commonly known as: REMERON Take 45 mg by mouth at bedtime.   multivitamin with minerals Tabs tablet Take 1 tablet by mouth daily.   pantoprazole 40 MG tablet Commonly known as: PROTONIX TAKE 1 TABLET BY MOUTH TWICE DAILY. TAKE 30 MINS PRIOR TO MEALS What changed: See the new instructions.        Discharge Exam: Filed Weights   05/06/22 1558 05/06/22 2012  Weight: 71.7 kg 71.8 kg   HEENT:  [place]/AT, No thrush, no icterus CV:  RRR, no rub, no S3, no S4 Lung:  CTA, no wheeze, no rhonchi Abd:  soft/+BS, NT Ext:  No edema, no lymphangitis, no synovitis, no rash   Condition at discharge: stable  The results of significant diagnostics from this hospitalization (including imaging, microbiology, ancillary and laboratory) are listed below for reference.   Imaging  Studies: NM Myocar Multi W/Spect W/Wall Motion / EF  Result Date: 05/07/2022   The study is normal. There are no perfusion defects consistent with prior infarct or current ischemia.  The study is low risk.   No ST deviation was noted.   LV perfusion is normal.   Left ventricular function is normal. Nuclear stress EF: 68 %. The left ventricular ejection fraction is hyperdynamic (>65%). End diastolic cavity size is normal.   There is a moderate size mild intensity inferior defect most intense in the resting images with normal wall motion consistent with diaphragmatic attenuation. There is a small mild intensity mid to distal anterior defect most intense in the resting images with normal wall motion, findings are consistent with breast attenuation.   US Carotid Bilateral  Result Date: 05/07/2022 CLINICAL DATA:  Right-sided carotid bruit. History of CAD, hypertension, hyperlipidemia and diabetes. EXAM: BILATERAL CAROTID DUPLEX ULTRASOUND TECHNIQUE: Pearline Cables scale imaging, color Doppler and duplex ultrasound were performed of bilateral carotid and vertebral arteries in the neck. COMPARISON:  None Available. FINDINGS: Criteria: Quantification of carotid stenosis is based on velocity parameters  that correlate the residual internal carotid diameter with NASCET-based stenosis levels, using the diameter of the distal internal carotid lumen as the denominator for stenosis measurement. The following velocity measurements were obtained: RIGHT ICA: 100/10 cm/sec CCA: 123XX123 cm/sec SYSTOLIC ICA/CCA RATIO:  1.6 ECA: 49 cm/sec LEFT ICA: 108/17 cm/sec CCA: [id number] cm/sec SYSTOLIC ICA/CCA RATIO:  1.4 ECA: 93 cm/sec RIGHT CAROTID ARTERY: There is a minimal amount of eccentric echogenic plaque within the right carotid bulb (image 16), extending to involve the origin and proximal aspects of the right internal carotid artery (image 24), not resulting in elevated peak systolic velocities within the interrogated course of the right internal  carotid artery to suggest a hemodynamically significant stenosis. RIGHT VERTEBRAL ARTERY:  Antegrade flow LEFT CAROTID ARTERY: There is a moderate amount of eccentric echogenic plaque within the left carotid bulb (image 50), extending to involve the origin and proximal aspects of the left internal carotid artery (image 58, not resulting in elevated peak systolic velocities within the interrogated course of the left internal carotid artery to suggest a hemodynamically significant stenosis. LEFT VERTEBRAL ARTERY:  Antegrade flow IMPRESSION: Minimal to moderate amount of bilateral atherosclerotic plaque, left-greater-than-right, not resulting in a hemodynamically significant stenosis within either internal carotid artery. Electronically Signed   By: Sandi Mariscal M.D.   On: 05/07/2022 14:53   ECHOCARDIOGRAM COMPLETE  Result Date: 05/07/2022    ECHOCARDIOGRAM REPORT   Patient Name:   JAKYLAH PANZERA Perz Date of Exam: 05/07/2022 Medical Rec #:  [id number]         Height:       65.0 in Accession #:    [id number]        Weight:       158.2 lb Date of Birth:  1940-04-19        BSA:          1.791 m Patient Age:    20 years          BP:           173/74 mmHg Patient Gender: F                 HR:           61 bpm. Exam Location:  [facility] Procedure: 2D Echo, Cardiac Doppler and Color Doppler Indications:    Chest Pain R07.9  History:        Patient has prior history of Echocardiogram examinations, most                 recent 10/10/2018. CAD, Signs/Symptoms:Chest Pain; Risk                 Factors:Hypertension, Diabetes, Non-Smoker and Dyslipidemia.  Sonographer:    Greer Pickerel Referring Phys: [id number] Bethena Roys  Sonographer Comments: No subcostal window. Image acquisition challenging due to patient body habitus and Image acquisition challenging due to respiratory motion. IMPRESSIONS  1. Left ventricular ejection fraction, by estimation, is 60 to 65%. The left ventricle has normal function. The left ventricle has no  regional wall motion abnormalities. There is moderate left ventricular hypertrophy. Left ventricular diastolic parameters are indeterminate. Elevated left atrial pressure.  2. Right ventricular systolic function is normal. The right ventricular size is normal. Tricuspid regurgitation signal is inadequate for assessing PA pressure.  3. Left atrial size was severely dilated.  4. The mitral valve is abnormal. Mild mitral valve regurgitation. No evidence of mitral stenosis.  5. The aortic valve has an indeterminant number of cusps.  There is mild calcification of the aortic valve. There is mild thickening of the aortic valve. Aortic valve regurgitation is not visualized. No aortic stenosis is present. FINDINGS  Left Ventricle: Left ventricular ejection fraction, by estimation, is 60 to 65%. The left ventricle has normal function. The left ventricle has no regional wall motion abnormalities. The left ventricular internal cavity size was normal in size. There is  moderate left ventricular hypertrophy. Left ventricular diastolic parameters are indeterminate. Elevated left atrial pressure. Right Ventricle: The right ventricular size is normal. Right vetricular wall thickness was not well visualized. Right ventricular systolic function is normal. Tricuspid regurgitation signal is inadequate for assessing PA pressure. Left Atrium: Left atrial size was severely dilated. Right Atrium: Right atrial size was normal in size. Pericardium: There is no evidence of pericardial effusion. Mitral Valve: The mitral valve is abnormal. Mild mitral valve regurgitation. No evidence of mitral valve stenosis. Tricuspid Valve: The tricuspid valve is normal in structure. Tricuspid valve regurgitation is trivial. No evidence of tricuspid stenosis. Aortic Valve: The aortic valve has an indeterminant number of cusps. There is mild calcification of the aortic valve. There is mild thickening of the aortic valve. There is mild aortic valve annular  calcification. Aortic valve regurgitation is not visualized. No aortic stenosis is present. Aortic valve mean gradient measures 4.3 mmHg. Aortic valve peak gradient measures 8.2 mmHg. Aortic valve area, by VTI measures 2.22 cm. Pulmonic Valve: The pulmonic valve was not well visualized. Pulmonic valve regurgitation is not visualized. No evidence of pulmonic stenosis. Aorta: The aortic root and ascending aorta are structurally normal, with no evidence of dilitation. IAS/Shunts: The interatrial septum was not well visualized.  LEFT VENTRICLE PLAX 2D LVIDd:         4.30 cm   Diastology LVIDs:         2.90 cm   LV e' medial:    5.07 cm/s LV PW:         1.40 cm   LV E/e' medial:  24.5 LV IVS:        1.40 cm   LV e' lateral:   7.04 cm/s LVOT diam:     1.80 cm   LV E/e' lateral: 17.6 LV SV:         80 LV SV Index:   45 LVOT Area:     2.54 cm  RIGHT VENTRICLE RV S prime:     12.80 cm/s TAPSE (M-mode): 2.2 cm LEFT ATRIUM              Index        RIGHT ATRIUM           Index LA diam:        4.10 cm  2.29 cm/m   RA Area:     15.20 cm LA Vol (A2C):   109.0 ml 60.88 ml/m  RA Volume:   34.50 ml  19.27 ml/m LA Vol (A4C):   146.0 ml 81.54 ml/m LA Biplane Vol: 131.0 ml 73.16 ml/m  AORTIC VALVE AV Area (Vmax):    2.13 cm AV Area (Vmean):   1.79 cm AV Area (VTI):     2.22 cm AV Vmax:           143.59 cm/s AV Vmean:          99.332 cm/s AV VTI:            0.360 m AV Peak Grad:      8.2 mmHg AV Mean Grad:  4.3 mmHg LVOT Vmax:         120.00 cm/s LVOT Vmean:        70.000 cm/s LVOT VTI:          0.314 m LVOT/AV VTI ratio: 0.87  AORTA Ao Root diam: 3.50 cm Ao Asc diam:  3.40 cm MITRAL VALVE MV Area (PHT): 4.31 cm     SHUNTS MV Decel Time: 176 msec     Systemic VTI:  0.31 m MR Peak grad: 128.4 mmHg    Systemic Diam: 1.80 cm MR Vmax:      566.50 cm/s MV E velocity: 124.00 cm/s MV A velocity: 123.00 cm/s MV E/A ratio:  1.01 Carlyle Dolly MD Electronically signed by Carlyle Dolly MD Signature Date/Time: 05/07/2022/2:18:12  PM    Final    US Venous Img Lower Bilateral (DVT)  Result Date: 05/07/2022 CLINICAL DATA:  82 year old female with history of bilateral lower extremity edema. EXAM: BILATERAL LOWER EXTREMITY VENOUS DOPPLER ULTRASOUND TECHNIQUE: Gray-scale sonography with graded compression, as well as color Doppler and duplex ultrasound were performed to evaluate the lower extremity deep venous systems from the level of the common femoral vein and including the common femoral, femoral, profunda femoral, popliteal and calf veins including the posterior tibial, peroneal and gastrocnemius veins when visible. The superficial great saphenous vein was also interrogated. Spectral Doppler was utilized to evaluate flow at rest and with distal augmentation maneuvers in the common femoral, femoral and popliteal veins. COMPARISON:  None Available. FINDINGS: RIGHT LOWER EXTREMITY Common Femoral Vein: No evidence of thrombus. Normal compressibility, respiratory phasicity and response to augmentation. Saphenofemoral Junction: No evidence of thrombus. Normal compressibility and flow on color Doppler imaging. Profunda Femoral Vein: No evidence of thrombus. Normal compressibility and flow on color Doppler imaging. Femoral Vein: No evidence of thrombus. Normal compressibility, respiratory phasicity and response to augmentation. Popliteal Vein: No evidence of thrombus. Normal compressibility, respiratory phasicity and response to augmentation. Calf Veins: No evidence of thrombus. Normal compressibility and flow on color Doppler imaging. Other Findings:  None. LEFT LOWER EXTREMITY Common Femoral Vein: No evidence of thrombus. Normal compressibility, respiratory phasicity and response to augmentation. Saphenofemoral Junction: No evidence of thrombus. Normal compressibility and flow on color Doppler imaging. Profunda Femoral Vein: No evidence of thrombus. Normal compressibility and flow on color Doppler imaging. Femoral Vein: No evidence of thrombus.  Normal compressibility, respiratory phasicity and response to augmentation. Popliteal Vein: No evidence of thrombus. Normal compressibility, respiratory phasicity and response to augmentation. Calf Veins: No evidence of thrombus. Normal compressibility and flow on color Doppler imaging. Other Findings:  None. IMPRESSION: No evidence of bilateral lower extremity deep venous thrombosis. Ruthann Cancer, MD [facility] Electronically Signed   By: Ruthann Cancer M.D.   On: 05/07/2022 11:24   DG Chest Portable 1 View  Result Date: 05/06/2022 CLINICAL DATA:  Chest pain EXAM: PORTABLE CHEST 1 VIEW COMPARISON:  Radiograph 11/01/2020 FINDINGS: Borderline enlarged cardiac silhouette. There is no focal airspace consolidation. There is no pleural effusion or evidence of pneumothorax. Thoracic spondylosis. No acute osseous abnormality. IMPRESSION: Borderline enlarged cardiac silhouette.  No focal airspace disease. Electronically Signed   By: Maurine Simmering M.D.   On: 05/06/2022 16:36    Microbiology: Results for orders placed or performed during the hospital encounter of 11/01/20  Resp Panel by RT-PCR (Flu A&B, Covid) Nasopharyngeal Swab     Status: None   Collection Time: 11/01/20  8:22 AM   Specimen: Nasopharyngeal Swab; Nasopharyngeal(NP) swabs in  vial transport medium  Result Value Ref Range Status   SARS Coronavirus 2 by RT PCR NEGATIVE NEGATIVE Final    Comment: (NOTE) SARS-CoV-2 target nucleic acids are NOT DETECTED.  The SARS-CoV-2 RNA is generally detectable in upper respiratory specimens during the acute phase of infection. The lowest concentration of SARS-CoV-2 viral copies this assay can detect is 138 copies/mL. A negative result does not preclude SARS-Cov-2 infection and should not be used as the sole basis for treatment or other patient management decisions. A negative result may occur with  improper specimen collection/handling, submission of  specimen other than nasopharyngeal swab, presence of viral mutation(s) within the areas targeted by this assay, and inadequate number of viral copies(<138 copies/mL). A negative result must be combined with clinical observations, patient history, and epidemiological information. The expected result is Negative.  Fact Sheet for Patients:  EntrepreneurPulse.com.au  Fact Sheet for Healthcare Providers:  IncredibleEmployment.be  This test is no t yet approved or cleared by the [place] FDA and  has been authorized for detection and/or diagnosis of SARS-CoV-2 by FDA under an Emergency Use Authorization (EUA). This EUA will remain  in effect (meaning this test can be used) for the duration of the COVID-19 declaration under Section 564(b)(1) of the Act, 21 U.S.C.section 360bbb-3(b)(1), unless the authorization is terminated  or revoked sooner.       Influenza A by PCR NEGATIVE NEGATIVE Final   Influenza B by PCR NEGATIVE NEGATIVE Final    Comment: (NOTE) The Xpert Xpress SARS-CoV-2/FLU/RSV plus assay is intended as an aid in the diagnosis of influenza from Nasopharyngeal swab specimens and should not be used as a sole basis for treatment. Nasal washings and aspirates are unacceptable for Xpert Xpress SARS-CoV-2/FLU/RSV testing.  Fact Sheet for Patients: EntrepreneurPulse.com.au  Fact Sheet for Healthcare Providers: IncredibleEmployment.be  This test is not yet approved or cleared by the [place] FDA and has been authorized for detection and/or diagnosis of SARS-CoV-2 by FDA under an Emergency Use Authorization (EUA). This EUA will remain in effect (meaning this test can be used) for the duration of the COVID-19 declaration under Section 564(b)(1) of the Act, 21 U.S.C. section 360bbb-3(b)(1), unless the authorization is terminated or revoked.  Performed at [facility], [address]  [place], [place] [address]   Culture, blood (routine x 2)     Status: None   Collection Time: 11/01/20  2:31 PM   Specimen: BLOOD LEFT HAND  Result Value Ref Range Status   Specimen Description BLOOD LEFT HAND  Final   Special Requests   Final    Blood Culture adequate volume BOTTLES DRAWN AEROBIC AND ANAEROBIC   Culture   Final    NO GROWTH 5 DAYS Performed at [facility], [address], [place], [place] [address]    Report Status 11/06/2020 FINAL  Final  Culture, blood (routine x 2)     Status: None   Collection Time: 11/01/20  2:31 PM   Specimen: BLOOD RIGHT HAND  Result Value Ref Range Status   Specimen Description BLOOD RIGHT HAND  Final   Special Requests   Final    Blood Culture adequate volume BOTTLES DRAWN AEROBIC AND ANAEROBIC   Culture   Final    NO GROWTH 5 DAYS Performed at [facility], [address], [place], [place] [address]    Report Status 11/06/2020 FINAL  Final    Labs: CBC: Recent Labs  Lab 05/06/22 1609 05/07/22 0407 05/08/22 0416  WBC 18.0* 14.0* 14.5*  HGB 9.5* 9.5* 9.9*  HCT 33.4* 33.9* 35.1*  MCV 76.1* 76.2* 75.3*  PLT 239 224 [id number]   Basic Metabolic Panel: Recent Labs  Lab 05/06/22 1609  NA 139  K 4.3  CL 110  CO2 22  GLUCOSE 155*  BUN 24*  CREATININE 0.87  CALCIUM 8.8*  MG 2.0   Liver Function Tests: No results for input(s): "AST", "ALT", "ALKPHOS", "BILITOT", "PROT", "ALBUMIN" in the last 168 hours. CBG: Recent Labs  Lab 05/07/22 1450 05/07/22 1637 05/07/22 2015 05/08/22 0703 05/08/22 1146  GLUCAP 176* 161* 179* 150* 149*    Discharge time spent: greater than 30 minutes.  Signed: Orson Eva, MD [facility] 05/08/2022

## 2022-05-08 NOTE — Progress Notes (Signed)
Returned from EGD  and asked for food with no complaints.  Daughter at bedside.  IV removed and DC instructions reviewed with script sent to pharmacy.  Transported by WC to main entrance.  Daughter to drive home

## 2022-05-11 LAB — SURGICAL PATHOLOGY

## 2022-05-12 NOTE — Anesthesia Postprocedure Evaluation (Signed)
Anesthesia Post Note  Patient: Carmen [name]  Procedure(s) Performed: ESOPHAGOGASTRODUODENOSCOPY (EGD) WITH PROPOFOL [place]  Patient location during evaluation: Phase II Anesthesia Type: General Level of consciousness: awake Pain management: pain level controlled Vital Signs Assessment: post-procedure vital signs reviewed and stable Respiratory status: spontaneous breathing and respiratory function stable Cardiovascular status: blood pressure returned to baseline and stable Postop Assessment: no headache and no apparent nausea or vomiting Anesthetic complications: no Comments: Late entry   No notable events documented.   Last Vitals:  Vitals:   05/08/22 1345 05/08/22 1431  BP: (!) 175/71 (!) 175/66  Pulse: (!) 58 (!) 58  Resp: (!) 22 18  Temp:  36.5 C  SpO2: 93% 99%    Last Pain:  Vitals:   05/08/22 1431  TempSrc: Oral  PainSc:                  Louann Sjogren

## 2022-05-14 ENCOUNTER — Ambulatory Visit: Payer: Medicare HMO | Admitting: Cardiology

## 2022-05-17 ENCOUNTER — Encounter: Payer: Self-pay | Admitting: Internal Medicine

## 2022-05-19 ENCOUNTER — Encounter (HOSPITAL_COMMUNITY): Payer: Self-pay | Admitting: Internal Medicine

## 2022-05-21 IMAGING — MR MR ABDOMEN WO/W CM MRCP
21 of 23 series · 44 of 48 positions shown · IV contrast (gadavist)
Comparison: CT 11/01/2020

CLINICAL DATA: Jaundice, abdominal pain and fevers.

EXAM:
MRI ABDOMEN WITHOUT AND WITH CONTRAST (INCLUDING MRCP)
TECHNIQUE: Multiplanar multisequence MR imaging of the abdomen was performed
both before and after the administration of intravenous contrast.
Heavily T2-weighted images of the biliary and pancreatic ducts were
obtained, and three-dimensional MRCP images were rendered by post
processing.
CONTRAST:  7mL GADAVIST GADOBUTROL 1 MMOL/ML IV SOLN

[Series 3: cor haste · coronal · 6.0mm · 1.25mm/px · 1 of 30 slices shown]
[im 1/30]
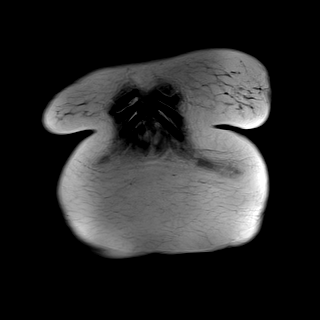

[Series 4: T2 fat-sat · axial · 6.0mm · 1.19mm/px · 1 of 34 slices shown]
[im 1/34]
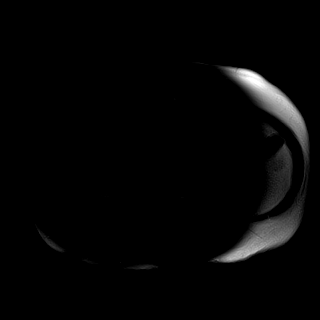

[Series 5: DWI · axial · 6.0mm · 1.42mm/px · 1 of 32 slices shown (1 of 4)]
[im 1/32]
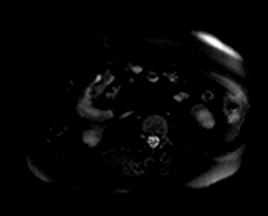

[Series 5: DWI · axial · 6.0mm · 1.42mm/px · 1 of 32 slices shown (2 of 4)]
[im 1/32]
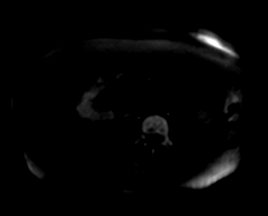

[Series 5: DWI · axial · 6.0mm · 1.42mm/px · 1 of 32 slices shown (3 of 4)]
[im 1/32]
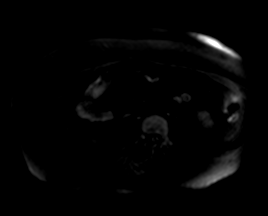

[Series 6: DWI · axial · 6.0mm · 1.42mm/px · 1 of 32 slices shown (4 of 4)]
[im 1/32]
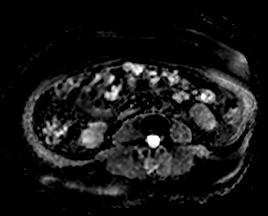

[Series 7: ax in and · axial · 3.0mm · 1.25mm/px · z∈[-105,+132]mm · 2 of 80 slices shown (1 of 2)]
[im 1/80]
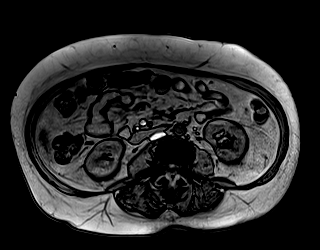
[im 80/80]
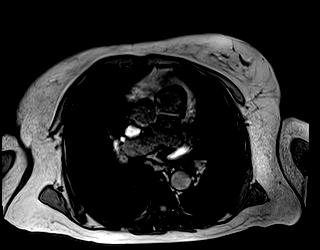

[Series 8: ax in and · axial · 3.0mm · 1.25mm/px · z∈[-105,+132]mm · 3 of 80 slices shown (2 of 2)]
[im 1/80]
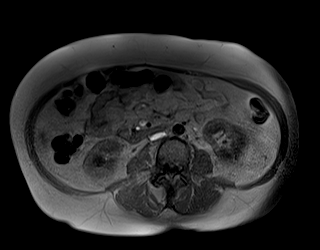
[im 40/80]
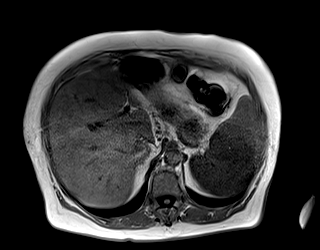
[im 80/80]
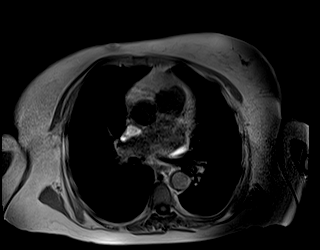

[Series 9: ax haste bh · axial · 6.0mm · 1.19mm/px · 1 of 30 slices shown]
[im 1/30]
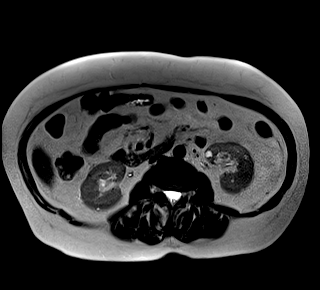

[Series 13: MRCP · coronal · 50.0mm · 0.78mm/px · 1 of 5 slices shown (1 of 2)]
[im 1/5]
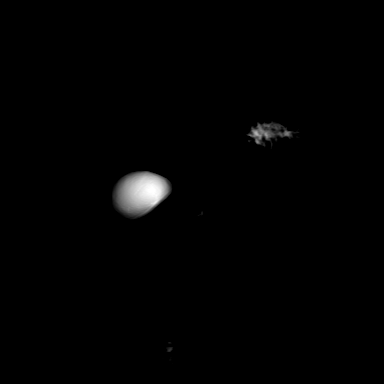

[Series 14: MRCP · coronal · 4.0mm · 1.12mm/px · 1 of 15 slices shown (2 of 2)]
[im 1/15]
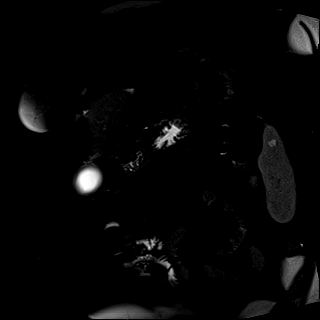

[Series 15: T1 dynamic · axial · 3.0mm · 1.19mm/px · z∈[-121,+116]mm · 3 of 80 slices shown (1 of 7)]
[im 1/80]
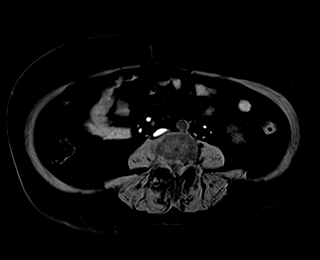
[im 40/80]
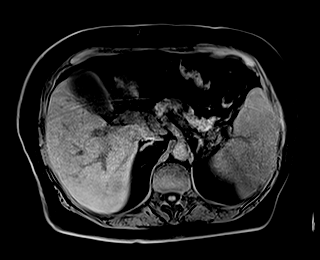
[im 80/80]
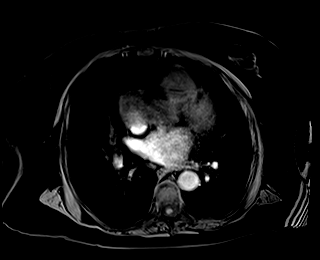

[Series 17: T1 dynamic · axial · 3.0mm · 1.19mm/px · z∈[-121,+116]mm · 3 of 80 slices shown (2 of 7)]
[im 1/80]
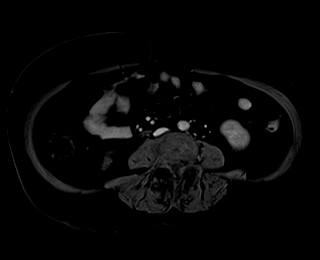
[im 40/80]
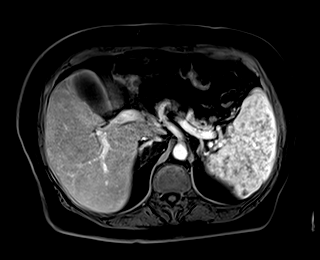
[im 80/80]
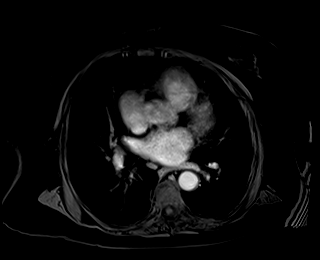

[Series 18: T1 dynamic · axial · 3.0mm · 1.19mm/px · z∈[-121,+116]mm · 3 of 80 slices shown (3 of 7)]
[im 1/80]
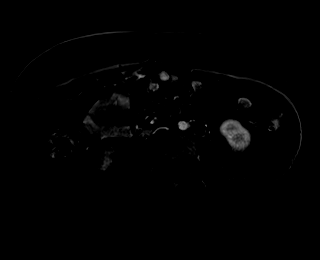
[im 40/80]
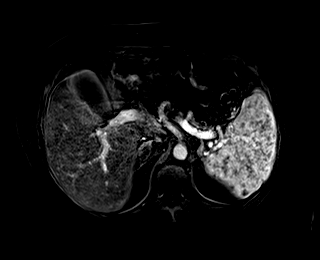
[im 80/80]
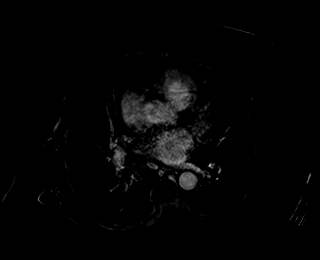

[Series 19: T1 dynamic · axial · 3.0mm · 1.19mm/px · z∈[-121,+116]mm · 3 of 80 slices shown (4 of 7)]
[im 1/80]
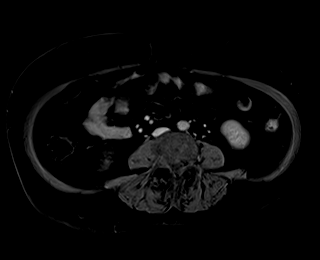
[im 40/80]
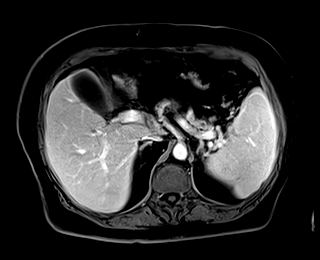
[im 80/80]
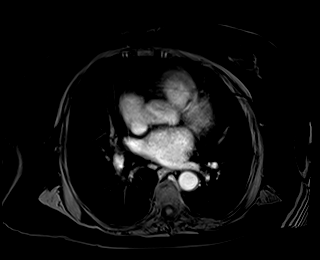

[Series 20: T1 dynamic · axial · 3.0mm · 1.19mm/px · z∈[-121,+116]mm · 3 of 80 slices shown (5 of 7)]
[im 1/80]
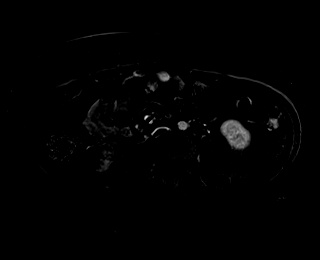
[im 40/80]
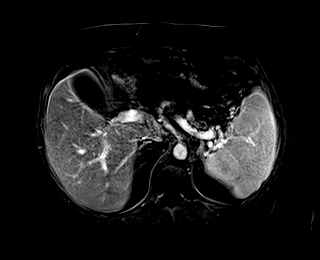
[im 80/80]
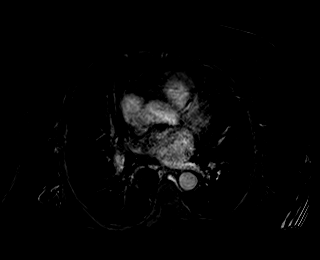

[Series 21: T1 dynamic · axial · 3.0mm · 1.19mm/px · z∈[-121,+116]mm · 3 of 80 slices shown (6 of 7)]
[im 1/80]
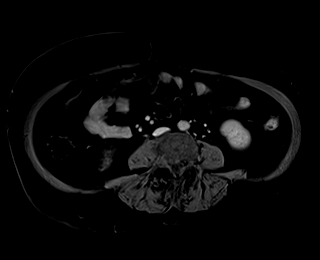
[im 40/80]
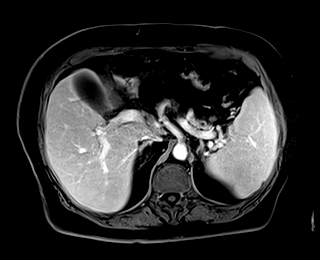
[im 80/80]
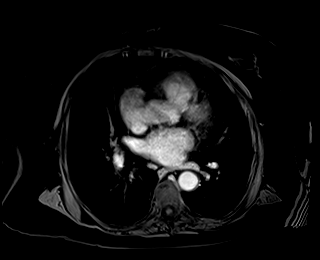

[Series 22: T1 dynamic · axial · 3.0mm · 1.19mm/px · z∈[-121,+116]mm · 3 of 80 slices shown (7 of 7)]
[im 1/80]
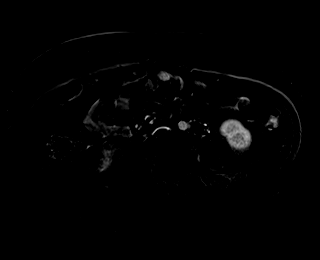
[im 40/80]
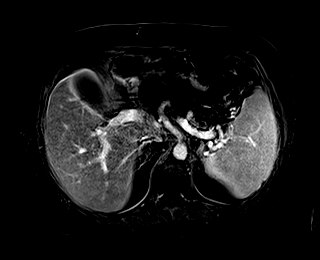
[im 80/80]
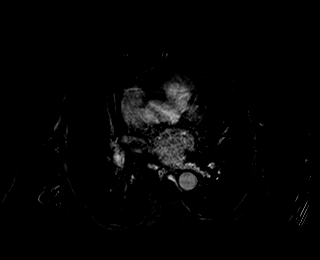

[Series 23: T1 dynamic post-contrast · coronal · 3.0mm · 1.31mm/px · 3 of 72 slices shown (1 of 3)]
[im 1/72]
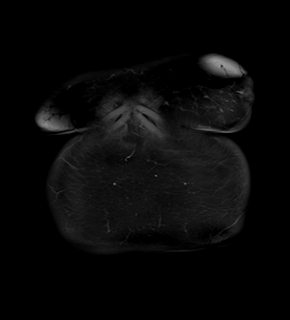
[im 36/72]
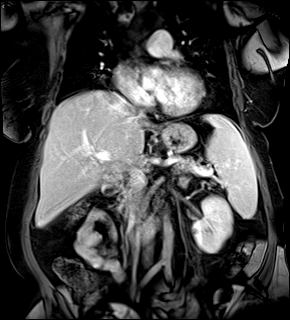
[im 72/72]
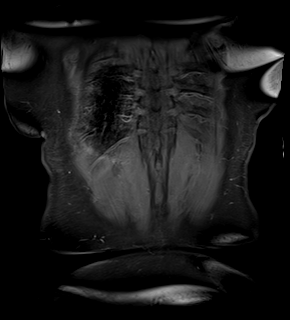

[Series 24: T1 dynamic post-contrast · axial · 3.0mm · 1.19mm/px · z∈[-121,+116]mm · 3 of 80 slices shown (2 of 3)]
[im 1/80]
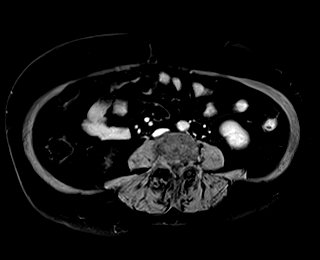
[im 40/80]
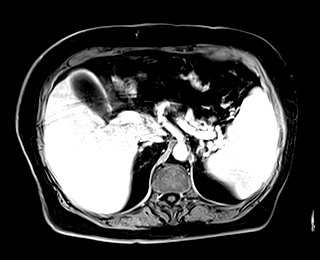
[im 80/80]
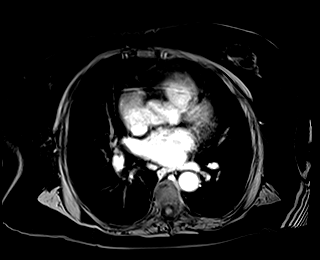

[Series 25: T1 dynamic post-contrast · axial · 3.0mm · 1.19mm/px · z∈[-121,+116]mm · 3 of 80 slices shown (3 of 3)]
[im 1/80]
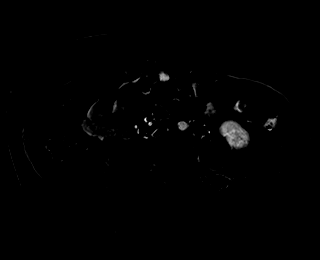
[im 40/80]
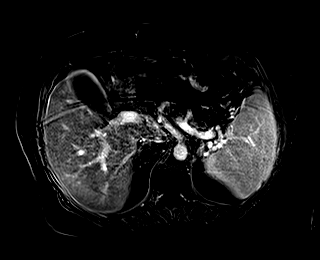
[im 80/80]
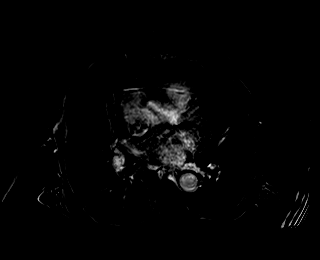

[44 of 48 positions shown; findings below may reference images not displayed]

FINDINGS: Lower chest:  Lung bases are clear.

Hepatobiliary: No intrahepatic biliary duct dilatation. The common
hepatic duct and common bile duct are normal caliber.

There is no gallstones evident within lumen the gallbladder. Small
stone within the neck of the gallbladder/cystic duct seen on
comparison CT is not readily placed on MRI imaging. Calcifications
can be difficult to detect on MRI.

There are no clear secondary signs of acute cholecystitis. No
pericholecystic fluid or gallbladder distension.

Pancreas: Normal pancreatic parenchymal intensity. No ductal
dilatation or inflammation.

Spleen: Normal spleen.

Adrenals/urinary tract: Adrenal glands and kidneys are normal.

Stomach/Bowel: Stomach and limited of the small bowel is
unremarkable

Vascular/Lymphatic: Abdominal aortic normal caliber. No
retroperitoneal periportal lymphadenopathy.

Musculoskeletal: No aggressive osseous lesion
IMPRESSION: 1. No choledocholithiasis.  No biliary dilatation.
2. No gallstones evident. The stone identified within the
gallbladder neck/cystic duct on comparison CT is not readily place.
3. No clear signs of acute cholecystitis.

## 2022-06-07 NOTE — Progress Notes (Deleted)
Referring Provider: [facility]* Primary Care Physician:  [facility] Primary GI Physician: Dr. Jena Gauss ***  No chief complaint on file.   HPI:   Carmen [name] is a 82 y.o. female history of CAD s/p PCI, PACs, HTN, HLD, type 2 diabetes, GERD, adenomatous colon polyps, presenting today for hospital follow-up of iron deficiency anemia.  Patient was admitted 05/06/2022 after presenting to the emergency room with mild chest pressure, fluttering in her chest x 1 week, admitted for workup of chest pain, and found to have anemia with hemoglobin of 9.5 with iron deficiency.  Her cardiac workup was benign.  Patient later reported melena for the last few days.  Reported chronic GERD well-controlled on pantoprazole 40 mg twice daily, but she did have recurrent dysphagia.  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  Prior "fluttering" in her chest had resolved.  Patient thought it was secondary to a cortisone injection.  EGD performed revealing mild Schatzki's ring s/p dilation, small hiatal hernia, antral deformity/nodularity and overlying erosions, no frank ulcer s/p biopsy. Pathology with erosions with associated adjacent cytologic atypia, indefinite for dysplasia.  Recommended repeat EGD in 3 months, continue PPI twice daily, avoid NSAIDs, and outpatient colonoscopy.  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  Pathology with tubular adenomas.   Past Medical History:  Diagnosis Date   CAD (coronary artery disease)    a. 2009 PCI/DES to LAD; b. 05/2015 MV: EF 64%, no ischemia or infarct.   Essential hypertension    GERD (gastroesophageal reflux disease)    Hyperlipidemia    Palpitations    a. 10/2018 Zio: Predominantly sinus rhythm with symptomatic PACs, PVCs, and short run of SVT (9 beats).  Managed  with beta-blocker.   Type 2 diabetes mellitus [facility])     Past Surgical History:  Procedure Laterality Date   ABDOMINAL HYSTERECTOMY     APPENDECTOMY     BIOPSY  05/08/2022   Procedure: BIOPSY;  Surgeon: Corbin Ade, MD;  [facility];  Service: Endoscopy;;   CATARACT EXTRACTION Left    COLONOSCOPY N/A 04/03/2015   RMR:4 polyps/mild diverticulosis/small internal hemorrhoids   COLONOSCOPY WITH ESOPHAGOGASTRODUODENOSCOPY (EGD)  02/15/2012   FRT:MYTRZ sessile polyps 3-8 mm found at the cecum/ 8 polyps measuring 3-8 mm in/1 polyp measuring 6 mm in transverse colon/3-55mm polyps found in the sigmoid/6 mm polyp found in the rectum/small internal hemorrhoids   ESOPHAGEAL DILATION     In the 1990s   ESOPHAGEAL DILATION  02/15/2012   SLF: Stricture was found at the gastroesophageal/ Non-erosive gastritis (inflammation)Sessile polyp measuring 4 mm/duodenal mucosa showed no abnormalities    ESOPHAGOGASTRODUODENOSCOPY N/A 04/03/2015   NBV:[id number] at the gastroesophageal junction/moderate nodular gastritis   ESOPHAGOGASTRODUODENOSCOPY (EGD) WITH PROPOFOL N/A 05/08/2022   Procedure: ESOPHAGOGASTRODUODENOSCOPY (EGD) WITH PROPOFOL;  Surgeon: Corbin Ade, MD;  [facility];  Service: Endoscopy;  Laterality: N/A;   KNEE SURGERY Left    MALONEY DILATION  05/08/2022   Procedure: MALONEY DILATION;  Surgeon: Corbin Ade, MD;  [facility];  Service: Endoscopy;;   SAVORY DILATION N/A 04/03/2015   Procedure: SAVORY DILATION;  Surgeon: West Bali, MD;  [facility];  Service: Endoscopy;  Laterality: N/A;    Current Outpatient Medications  Medication Sig Dispense Refill  amLODipine (NORVASC) 10 MG tablet Take 10 mg by mouth daily.     aspirin 81 MG chewable tablet Chew 81 mg by mouth daily.     atorvastatin (LIPITOR) 40 MG tablet Take 1 tablet by mouth daily.     busPIRone (BUSPAR) 15 MG tablet Take 15 mg by mouth 3 (three) times daily. Take 1/2 tablet  daily     ferrous sulfate 325 (65 FE) MG tablet Take 1 tablet (325 mg total) by mouth daily with breakfast.  3   glipiZIDE (GLUCOTROL) 5 MG tablet Take 5 mg by mouth 2 (two) times daily before a meal.     LANTUS SOLOSTAR 100 UNIT/ML Solostar Pen Inject 10 Units into the skin daily as needed (Blood sugar).     losartan (COZAAR) 25 MG tablet Take 1 tablet (25 mg total) by mouth daily. 30 tablet 1   metFORMIN (GLUCOPHAGE) 500 MG tablet Take 500 mg by mouth 2 (two) times daily with a meal.     metoprolol succinate (TOPROL XL) 50 MG 24 hr tablet Take 1 tablet (50 mg total) by mouth every evening. Take with or immediately following a meal. 90 tablet 3   metoprolol succinate (TOPROL-XL) 100 MG 24 hr tablet Take 1 tablet (100 mg total) by mouth daily. 90 tablet 0   mirtazapine (REMERON) 45 MG tablet Take 45 mg by mouth at bedtime.     Multiple Vitamin (MULTIVITAMIN WITH MINERALS) TABS tablet Take 1 tablet by mouth daily.     pantoprazole (PROTONIX) 40 MG tablet TAKE 1 TABLET BY MOUTH TWICE DAILY. TAKE 30 MINS PRIOR TO MEALS (Patient taking differently: Take 40 mg by mouth 2 (two) times daily before a meal.) 60 tablet 5   No current facility-administered medications for this visit.    Allergies as of 06/10/2022 - Review Complete 05/08/2022  Allergen Reaction Noted   Sulfa antibiotics Nausea And Vomiting 11/30/2014    Family History  Problem Relation Age of Onset   Heart attack Father    Kidney disease Mother    Diabetes Brother    Hypertension Brother    Stomach cancer Maternal Uncle    Throat cancer Paternal Uncle    Diabetes Brother    Hypertension Brother    Prostate cancer Maternal Uncle    Brain cancer Maternal Uncle    Colon cancer Neg Hx    Liver disease Neg Hx     Social History   Socioeconomic History   Marital status: Divorced    Spouse name: Not on file   Number of children: 3   Years of education: Not on file   Highest education level: Not on file  Occupational History    Not on file  Tobacco Use   Smoking status: Never   Smokeless tobacco: Never  Vaping Use   Vaping Use: Never used  Substance and Sexual Activity   Alcohol use: No    Alcohol/week: 0.0 standard drinks of alcohol   Drug use: No   Sexual activity: Not Currently  Other Topics Concern   Not on file  Social History Narrative   Not on file   Social Determinants of Health   Financial Resource Strain: Not on file  Food Insecurity: No Food Insecurity (05/06/2022)   Hunger Vital Sign    Worried About Running Out of Food in the Last Year: Never true    Ran Out of Food in the Last Year: Never true  Transportation Needs: No Transportation Needs (05/06/2022)   PRAPARE - Transportation  Lack of Transportation (Medical): No    Lack of Transportation (Non-Medical): No  Physical Activity: Not on file  Stress: Not on file  Social Connections: Not on file    Review of Systems: Gen: Denies fever, chills, anorexia. Denies fatigue, weakness, weight loss.  CV: Denies chest pain, palpitations, syncope, peripheral edema, and claudication. Resp: Denies dyspnea at rest, cough, wheezing, coughing up blood, and pleurisy. GI: Denies vomiting blood, jaundice, and fecal incontinence.   Denies dysphagia or odynophagia. Derm: Denies rash, itching, dry skin Psych: Denies depression, anxiety, memory loss, confusion. No homicidal or suicidal ideation.  Heme: Denies bruising, bleeding, and enlarged lymph nodes.  Physical Exam: There were no vitals taken for this visit. General:   Alert and oriented. No distress noted. Pleasant and cooperative.  Head:  Normocephalic and atraumatic. Eyes:  Conjuctiva clear without scleral icterus. Heart:  S1, S2 present without murmurs appreciated. Lungs:  Clear to auscultation bilaterally. No wheezes, rales, or rhonchi. No distress.  Abdomen:  +BS, soft, non-tender and non-distended. No rebound or guarding. No HSM or masses noted. Msk:  Symmetrical without gross deformities.  Normal posture. Extremities:  Without edema. Neurologic:  Alert and  oriented x4 Psych:  Normal mood and affect.    Assessment:     Plan:  ***   Ermalinda MemosKristen Rubye Strohmeyer, PA-C [facility] 06/10/2022

## 2022-06-10 ENCOUNTER — Encounter: Payer: Self-pay | Admitting: Gastroenterology

## 2022-06-10 ENCOUNTER — Ambulatory Visit: Payer: Medicare HMO | Admitting: Gastroenterology

## 2022-08-12 ENCOUNTER — Encounter: Payer: Self-pay | Admitting: Gastroenterology

## 2022-08-21 ENCOUNTER — Inpatient Hospital Stay: Payer: Medicare HMO | Admitting: Hematology

## 2022-08-21 ENCOUNTER — Inpatient Hospital Stay: Payer: Medicare HMO

## 2022-09-05 NOTE — Progress Notes (Deleted)
Referring Provider: Alvina Filbert, MD Primary Care Physician:  Alvina Filbert, MD Primary GI Physician: Dr. Jena Gauss  No chief complaint on file.   HPI:   Carmen [name] is a 82 y.o. female with history of CAD s/p PCI, PACs, HTN, HLD, type 2 diabetes, GERD, adenomatous colon polyps, presenting today for hospital follow-up of iron deficiency anemia.   Patient was admitted 05/06/2022 after presenting to the emergency room with mild chest pressure, fluttering in her chest x 1 week, admitted for workup of chest pain, and found to have anemia with hemoglobin of 9.5 with iron deficiency.  Her cardiac workup was benign.  Patient later reported melena for the last few days.  Reported chronic GERD well-controlled on pantoprazole 40 mg twice daily, but she did have recurrent dysphagia.  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  Prior "fluttering" in her chest had resolved.  Patient thought it was secondary to a cortisone injection.  EGD performed revealing mild Schatzki's ring s/p dilation, small hiatal hernia, antral deformity/nodularity and overlying erosions, no frank ulcer s/p biopsy. Pathology with erosions with associated adjacent cytologic atypia, indefinite for dysplasia.  Recommended repeat EGD in 3 months, continue PPI twice daily, avoid NSAIDs, and outpatient colonoscopy.   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  Pathology with tubular adenomas.   Last labs 05/08/22 with Hgb 9.9.   Past Medical History:  Diagnosis Date   CAD (coronary artery disease)    a. 2009 PCI/DES to LAD; b. 05/2015 MV: EF 64%, no ischemia or infarct.   Essential hypertension    GERD (gastroesophageal reflux disease)    Hyperlipidemia    Palpitations    a. 10/2018 Zio: Predominantly sinus rhythm with symptomatic PACs, PVCs,  and short run of SVT (9 beats).  Managed with beta-blocker.   Type 2 diabetes mellitus [facility])     Past Surgical History:  Procedure Laterality Date   ABDOMINAL HYSTERECTOMY     APPENDECTOMY     BIOPSY  05/08/2022   Procedure: BIOPSY;  Surgeon: Corbin Ade, MD;  [facility];  Service: Endoscopy;;   CATARACT EXTRACTION Left    COLONOSCOPY N/A 04/03/2015   RMR:4 polyps/mild diverticulosis/small internal hemorrhoids   COLONOSCOPY WITH ESOPHAGOGASTRODUODENOSCOPY (EGD)  02/15/2012   OZH:YQMVH sessile polyps 3-8 mm found at the cecum/ 8 polyps measuring 3-8 mm in/1 polyp measuring 6 mm in transverse colon/3-58mm polyps found in the sigmoid/6 mm polyp found in the rectum/small internal hemorrhoids   ESOPHAGEAL DILATION     In the 1990s   ESOPHAGEAL DILATION  02/15/2012   SLF: Stricture was found at the gastroesophageal/ Non-erosive gastritis (inflammation)Sessile polyp measuring 4 mm/duodenal mucosa showed no abnormalities    ESOPHAGOGASTRODUODENOSCOPY N/A 04/03/2015   QIO:[id number] at the gastroesophageal junction/moderate nodular gastritis   ESOPHAGOGASTRODUODENOSCOPY (EGD) WITH PROPOFOL N/A 05/08/2022   Procedure: ESOPHAGOGASTRODUODENOSCOPY (EGD) WITH PROPOFOL;  Surgeon: Corbin Ade, MD;  [facility];  Service: Endoscopy;  Laterality: N/A;   KNEE SURGERY Left    MALONEY DILATION  05/08/2022   Procedure: MALONEY DILATION;  Surgeon: Corbin Ade, MD;  [facility];  Service: Endoscopy;;   SAVORY DILATION N/A 04/03/2015   Procedure: SAVORY DILATION;  Surgeon: West Bali, MD;  [facility];  Service: Endoscopy;  Laterality: N/A;    Current Outpatient Medications  Medication Sig Dispense Refill   amLODipine (NORVASC) 10 MG tablet Take 10 mg by mouth daily.     aspirin 81 MG chewable tablet Chew 81 mg by mouth daily.     atorvastatin (LIPITOR) 40 MG tablet Take 1 tablet by mouth daily.     busPIRone (BUSPAR) 15 MG tablet Take 15 mg by mouth  3 (three) times daily. Take 1/2 tablet daily     ferrous sulfate 325 (65 FE) MG tablet Take 1 tablet (325 mg total) by mouth daily with breakfast.  3   glipiZIDE (GLUCOTROL) 5 MG tablet Take 5 mg by mouth 2 (two) times daily before a meal.     LANTUS SOLOSTAR 100 UNIT/ML Solostar Pen Inject 10 Units into the skin daily as needed (Blood sugar).     losartan (COZAAR) 25 MG tablet Take 1 tablet (25 mg total) by mouth daily. 30 tablet 1   metFORMIN (GLUCOPHAGE) 500 MG tablet Take 500 mg by mouth 2 (two) times daily with a meal.     metoprolol succinate (TOPROL XL) 50 MG 24 hr tablet Take 1 tablet (50 mg total) by mouth every evening. Take with or immediately following a meal. 90 tablet 3   metoprolol succinate (TOPROL-XL) 100 MG 24 hr tablet Take 1 tablet (100 mg total) by mouth daily. 90 tablet 0   mirtazapine (REMERON) 45 MG tablet Take 45 mg by mouth at bedtime.     Multiple Vitamin (MULTIVITAMIN WITH MINERALS) TABS tablet Take 1 tablet by mouth daily.     pantoprazole (PROTONIX) 40 MG tablet TAKE 1 TABLET BY MOUTH TWICE DAILY. TAKE 30 MINS PRIOR TO MEALS (Patient taking differently: Take 40 mg by mouth 2 (two) times daily before a meal.) 60 tablet 5   No current facility-administered medications for this visit.    Allergies as of 09/09/2022 - Review Complete 05/08/2022  Allergen Reaction Noted   Sulfa antibiotics Nausea And Vomiting 11/30/2014    Family History  Problem Relation Age of Onset   Heart attack Father    Kidney disease Mother    Diabetes Brother    Hypertension Brother    Stomach cancer Maternal Uncle    Throat cancer Paternal Uncle    Diabetes Brother    Hypertension Brother    Prostate cancer Maternal Uncle    Brain cancer Maternal Uncle    Colon cancer Neg Hx    Liver disease Neg Hx     Social History   Socioeconomic History   Marital status: Divorced    Spouse name: Not on file   Number of children: 3   Years of education: Not on file   Highest education  level: Not on file  Occupational History   Not on file  Tobacco Use   Smoking status: Never   Smokeless tobacco: Never  Vaping Use   Vaping Use: Never used  Substance and Sexual Activity   Alcohol use: No    Alcohol/week: 0.0 standard drinks of alcohol   Drug use: No   Sexual activity: Not Currently  Other Topics Concern   Not on file  Social History Narrative   Not on file   Social Determinants of Health   Financial Resource Strain: Not on file  Food Insecurity: No Food Insecurity (05/06/2022)   Hunger Vital Sign    Worried About Running Out of Food in the Last Year: Never true    Ran Out of Food in the Last  Year: Never true  Transportation Needs: No Transportation Needs (05/06/2022)   PRAPARE - Administrator, Civil Service (Medical): No    Lack of Transportation (Non-Medical): No  Physical Activity: Not on file  Stress: Not on file  Social Connections: Not on file    Review of Systems: Gen: Denies fever, chills, anorexia. Denies fatigue, weakness, weight loss.  CV: Denies chest pain, palpitations, syncope, peripheral edema, and claudication. Resp: Denies dyspnea at rest, cough, wheezing, coughing up blood, and pleurisy. GI: Denies vomiting blood, jaundice, and fecal incontinence.   Denies dysphagia or odynophagia. Derm: Denies rash, itching, dry skin Psych: Denies depression, anxiety, memory loss, confusion. No homicidal or suicidal ideation.  Heme: Denies bruising, bleeding, and enlarged lymph nodes.  Physical Exam: There were no vitals taken for this visit. General:   Alert and oriented. No distress noted. Pleasant and cooperative.  Head:  Normocephalic and atraumatic. Eyes:  Conjuctiva clear without scleral icterus. Heart:  S1, S2 present without murmurs appreciated. Lungs:  Clear to auscultation bilaterally. No wheezes, rales, or rhonchi. No distress.  Abdomen:  +BS, soft, non-tender and non-distended. No rebound or guarding. No HSM or masses  noted. Msk:  Symmetrical without gross deformities. Normal posture. Extremities:  Without edema. Neurologic:  Alert and  oriented x4 Psych:  Normal mood and affect.    Assessment:     Plan:  ***   Ermalinda Memos, PA-C [facility] 09/09/2022

## 2022-09-09 ENCOUNTER — Ambulatory Visit: Payer: Medicare HMO | Admitting: Gastroenterology

## 2022-10-21 NOTE — Progress Notes (Unsigned)
GI Office Note    Referring Provider: Alvina Filbert, MD Primary Care Physician:  Alvina Filbert, MD  Primary Gastroenterologist: Gerrit Friends.Rourk, MD, previously Dr. Darrick Penna.   Chief Complaint   No chief complaint on file.  History of Present Illness   Carmen [name] is a 82 y.o. female with a history of HLD, HTN, diabetes, CAD s/p PCI, GERD, and adenomatous colon polyps presenting today to discuss colonoscopy and further evaluation of IDA.  Last seen in our office in January 2017 by Dr. Darrick Penna.  Patient reported grabbing chest pain off and on for the last month with regurgitation.  Noted to have an EGD with dilation in 2013.  She was also having some shortness of breath for few minutes with need to sit down until 1 day of black stool.  She reported good appetite and stable weight.  She denies any heartburn and reported regular bowel movements.  History of multiple simple adenomas removed in 2013.  Advised colonoscopy as well as upper endoscopy.  Colonoscopy February 2017: -3 polyps removed from hepatic flexure, transverse, and ascending colon.   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   -Another 6 mm polyp found at the hepatic flexure -Small internal hemorrhoids. -Advised to follow a high-fiber/low-fat diet. 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  Negative for H. Pylori -PPI twice daily  Seen by our service at [facility] in March 2024 for IDA and melena.  Patient reported melena for several weeks and recently found to be iron deficient with ferritin 6, iron 19, saturation 4% also with low B12.  Her hemoglobin was 9.5 in the ED.  She admitted to taking 4-6 Aleve daily for 3 weeks due to have arthritis in her shoulder.  She underwent EGD as noted  below.  She was treated with PPI twice daily and advised to continue daily iron supplementation and perform outpatient colonoscopy.  EGD March 2024: -Schatzki's ring s/p dilation -Small hiatal hernia -Antral deformity/nodularity and overlying erosions s/p biopsy -Advised to avoid all NSAIDs -Advise surveillance colonoscopy and follow-up in the office in 4 weeks -Continue PPI twice daily  No recent labs since patient's hospitalization.  Today:   Current Outpatient Medications  Medication Sig Dispense Refill   amLODipine (NORVASC) 10 MG tablet Take 10 mg by mouth daily.     aspirin 81 MG chewable tablet Chew 81 mg by mouth daily.     atorvastatin (LIPITOR) 40 MG tablet Take 1 tablet by mouth daily.     busPIRone (BUSPAR) 15 MG tablet Take 15 mg by mouth 3 (three) times daily. Take 1/2 tablet daily     ferrous sulfate 325 (65 FE) MG tablet Take 1 tablet (325 mg total) by mouth daily with breakfast.  3   glipiZIDE (GLUCOTROL) 5 MG tablet Take 5 mg by mouth 2 (two) times daily before a meal.     LANTUS SOLOSTAR 100 UNIT/ML Solostar Pen Inject 10 Units into the skin daily as needed (Blood sugar).     losartan (COZAAR) 25 MG tablet Take 1 tablet (25 mg total) by mouth daily. 30 tablet 1   metFORMIN (GLUCOPHAGE) 500 MG tablet Take 500 mg by mouth 2 (two) times daily with a meal.     metoprolol succinate (TOPROL XL) 50 MG 24 hr tablet Take 1 tablet (50 mg  total) by mouth every evening. Take with or immediately following a meal. 90 tablet 3   metoprolol succinate (TOPROL-XL) 100 MG 24 hr tablet Take 1 tablet (100 mg total) by mouth daily. 90 tablet 0   mirtazapine (REMERON) 45 MG tablet Take 45 mg by mouth at bedtime.     Multiple Vitamin (MULTIVITAMIN WITH MINERALS) TABS tablet Take 1 tablet by mouth daily.     pantoprazole (PROTONIX) 40 MG tablet TAKE 1 TABLET BY MOUTH TWICE DAILY. TAKE 30 MINS PRIOR TO MEALS (Patient taking differently: Take 40 mg by mouth 2 (two) times daily before a meal.)  60 tablet 5   No current facility-administered medications for this visit.    Past Medical History:  Diagnosis Date   CAD (coronary artery disease)    a. 2009 PCI/DES to LAD; b. 05/2015 MV: EF 64%, no ischemia or infarct.   Essential hypertension    GERD (gastroesophageal reflux disease)    Hyperlipidemia    Palpitations    a. 10/2018 Zio: Predominantly sinus rhythm with symptomatic PACs, PVCs, and short run of SVT (9 beats).  Managed with beta-blocker.   Type 2 diabetes mellitus [facility])     Past Surgical History:  Procedure Laterality Date   ABDOMINAL HYSTERECTOMY     APPENDECTOMY     BIOPSY  05/08/2022   Procedure: BIOPSY;  Surgeon: Corbin Ade, MD;  [facility];  Service: Endoscopy;;   CATARACT EXTRACTION Left    COLONOSCOPY N/A 04/03/2015   RMR:4 polyps/mild diverticulosis/small internal hemorrhoids   COLONOSCOPY WITH ESOPHAGOGASTRODUODENOSCOPY (EGD)  02/15/2012   QMV:HQION sessile polyps 3-8 mm found at the cecum/ 8 polyps measuring 3-8 mm in/1 polyp measuring 6 mm in transverse colon/3-54mm polyps found in the sigmoid/6 mm polyp found in the rectum/small internal hemorrhoids   ESOPHAGEAL DILATION     In the 1990s   ESOPHAGEAL DILATION  02/15/2012   SLF: Stricture was found at the gastroesophageal/ Non-erosive gastritis (inflammation)Sessile polyp measuring 4 mm/duodenal mucosa showed no abnormalities    ESOPHAGOGASTRODUODENOSCOPY N/A 04/03/2015   GEX:[id number] at the gastroesophageal junction/moderate nodular gastritis   ESOPHAGOGASTRODUODENOSCOPY (EGD) WITH PROPOFOL N/A 05/08/2022   Procedure: ESOPHAGOGASTRODUODENOSCOPY (EGD) WITH PROPOFOL;  Surgeon: Corbin Ade, MD;  [facility];  Service: Endoscopy;  Laterality: N/A;   KNEE SURGERY Left    MALONEY DILATION  05/08/2022   Procedure: MALONEY DILATION;  Surgeon: Corbin Ade, MD;  [facility];  Service: Endoscopy;;   SAVORY DILATION N/A 04/03/2015   Procedure: SAVORY DILATION;  Surgeon:  West Bali, MD;  Location[facility];  Service: Endoscopy;  Laterality: N/A;    Family History  Problem Relation Age of Onset   Heart attack Father    Kidney disease Mother    Diabetes Brother    Hypertension Brother    Stomach cancer Maternal Uncle    Throat cancer Paternal Uncle    Diabetes Brother    Hypertension Brother    Prostate cancer Maternal Uncle    Brain cancer Maternal Uncle    Colon cancer Neg Hx    Liver disease Neg Hx     Allergies as of 10/22/2022 - Review Complete 05/08/2022  Allergen Reaction Noted   Sulfa antibiotics Nausea And Vomiting 11/30/2014    Social History   Socioeconomic History   Marital status: Divorced    Spouse name: Not on file   Number of children: 3   Years of education: Not on file   Highest education  level: Not on file  Occupational History   Not on file  Tobacco Use   Smoking status: Never   Smokeless tobacco: Never  Vaping Use   Vaping status: Never Used  Substance and Sexual Activity   Alcohol use: No    Alcohol/week: 0.0 standard drinks of alcohol   Drug use: No   Sexual activity: Not Currently  Other Topics Concern   Not on file  Social History Narrative   Not on file   Social Determinants of Health   Financial Resource Strain: Not on file  Food Insecurity: No Food Insecurity (05/06/2022)   Hunger Vital Sign    Worried About Running Out of Food in the Last Year: Never true    Ran Out of Food in the Last Year: Never true  Transportation Needs: No Transportation Needs (05/06/2022)   PRAPARE - Administrator, Civil Service (Medical): No    Lack of Transportation (Non-Medical): No  Physical Activity: Not on file  Stress: Not on file  Social Connections: Unknown (07/13/2021)   Received from [facility]   Social Network    Social Network: Not on file  Intimate Partner Violence: Not At Risk (05/06/2022)   Humiliation, Afraid, Rape, and Kick questionnaire    Fear of Current or Ex-Partner: No     Emotionally Abused: No    Physically Abused: No    Sexually Abused: No     Review of Systems   Gen: Denies any fever, chills, fatigue, weight loss, lack of appetite.  CV: Denies chest pain, heart palpitations, peripheral edema, syncope.  Resp: Denies shortness of breath at rest or with exertion. Denies wheezing or cough.  GI: see HPI GU : Denies urinary burning, urinary frequency, urinary hesitancy MS: Denies joint pain, muscle weakness, cramps, or limitation of movement.  Derm: Denies rash, itching, dry skin Psych: Denies depression, anxiety, memory loss, and confusion Heme: Denies bruising, bleeding, and enlarged lymph nodes.   Physical Exam   There were no vitals taken for this visit.  General:   Alert and oriented. Pleasant and cooperative. Well-nourished and well-developed.  Head:  Normocephalic and atraumatic. Eyes:  Without icterus, sclera clear and conjunctiva pink.  Ears:  Normal auditory acuity. Mouth:  No deformity or lesions, oral mucosa pink.  Lungs:  Clear to auscultation bilaterally. No wheezes, rales, or rhonchi. No distress.  Heart:  S1, S2 present without murmurs appreciated.  Abdomen:  +BS, soft, non-tender and non-distended. No HSM noted. No guarding or rebound. No masses appreciated.  Rectal:  Deferred *** Msk:  Symmetrical without gross deformities. Normal posture. Extremities:  Without edema. Neurologic:  Alert and  oriented x4;  grossly normal neurologically. Skin:  Intact without significant lesions or rashes. Psych:  Alert and cooperative. Normal mood and affect.   Assessment   Carmen [name] is a 82 y.o. female with a history of HLD, HTN, diabetes, CAD s/p PCI, GERD, and adenomatous colon polyps presenting today to discuss colonoscopy and further evaluation of IDA.  Iron deficiency anemia: Admission earlier this year with anemia of hemoglobin of 9.9, slightly lower than patient's baseline which appears to be 10-11 range.  She had evidence of  iron deficiency on labs with low ferritin, iron, and iron sat ration.  Also with low B12.  She underwent EGD which revealed nodularity and erosions in the stomach, small hiatal hernia, and Schatzki's ring that was dilated.  She admitted to ongoing NSAID use at the time which was likely contributing factor to  her melena.  She is in need of colonoscopy for surveillance purposes in regards to her colon polyps as well as to further evaluate her IDA.  History of adenomatous colon polyps: Last colonoscopy in 2017 with 4 tubular adenomas removed, mild sigmoid diverticulosis, small internal hemorrhoids.  Also with single AVM at the hepatic flexure.  PLAN   ***    Brooke Bonito, MSN, FNP-BC, AGACNP-BC [facility]

## 2022-10-22 ENCOUNTER — Ambulatory Visit (INDEPENDENT_AMBULATORY_CARE_PROVIDER_SITE_OTHER): Payer: Medicare HMO | Admitting: Gastroenterology

## 2022-10-22 ENCOUNTER — Encounter: Payer: Self-pay | Admitting: Gastroenterology

## 2022-10-22 VITALS — BP 136/73 | HR 58 | Temp 97.6°F | Ht 66.0 in | Wt 156.6 lb

## 2022-10-22 DIAGNOSIS — Z86010 Personal history of colonic polyps: Secondary | ICD-10-CM

## 2022-10-22 DIAGNOSIS — R197 Diarrhea, unspecified: Secondary | ICD-10-CM

## 2022-10-22 DIAGNOSIS — D509 Iron deficiency anemia, unspecified: Secondary | ICD-10-CM

## 2022-10-22 DIAGNOSIS — K219 Gastro-esophageal reflux disease without esophagitis: Secondary | ICD-10-CM

## 2022-10-22 DIAGNOSIS — Z1211 Encounter for screening for malignant neoplasm of colon: Secondary | ICD-10-CM

## 2022-10-22 NOTE — Patient Instructions (Addendum)
Please have blood work completed at [facility].  We will call you with results once they have been received. Please allow 3-5 business days for review. 2 locations for [facility] in [place]:              1. [address], [place]              2. [address] [place]   We are getting you scheduled for an upper endoscopy and colonoscopy in the near future with Dr. Jena Gauss.  You will receive separate detailed written instructions regarding your prep and medication adjustments.  Continue pantoprazole 40 mg twice daily.  I will plan to see you in about 4 months, this will be after you have your procedures which would likely be in October.  It was a pleasure to see you today. I want to create trusting relationships with patients. If you receive a survey regarding your visit,  I greatly appreciate you taking time to fill this out on paper or through your MyChart. I value your feedback.  Brooke Bonito, MSN, FNP-BC, AGACNP-BC [facility]

## 2022-10-27 LAB — CBC
Hematocrit: 40.1 % (ref 34.0–46.6)
Hemoglobin: 12.4 g/dL (ref 11.1–15.9)
MCH: 26.1 pg — ABNORMAL LOW (ref 26.6–33.0)
MCHC: 30.9 g/dL — ABNORMAL LOW (ref 31.5–35.7)
MCV: 84 fL (ref 79–97)
Platelets: 216 10*3/uL (ref 150–450)
RBC: 4.75 x10E6/uL (ref 3.77–5.28)
RDW: 15.6 % — ABNORMAL HIGH (ref 11.7–15.4)
WBC: 14 10*3/uL — ABNORMAL HIGH (ref 3.4–10.8)

## 2022-10-27 LAB — COMPREHENSIVE METABOLIC PANEL
ALT: 26 IU/L (ref 0–32)
AST: 18 IU/L (ref 0–40)
Albumin: 3.9 g/dL (ref 3.7–4.7)
Alkaline Phosphatase: 86 IU/L (ref 44–121)
BUN/Creatinine Ratio: 16 (ref 12–28)
BUN: 16 mg/dL (ref 8–27)
Bilirubin Total: 0.6 mg/dL (ref 0.0–1.2)
CO2: 24 mmol/L (ref 20–29)
Calcium: 8.9 mg/dL (ref 8.7–10.3)
Chloride: 104 mmol/L (ref 96–106)
Creatinine, Ser: 0.98 mg/dL (ref 0.57–1.00)
Globulin, Total: 1.8 g/dL (ref 1.5–4.5)
Glucose: 198 mg/dL — ABNORMAL HIGH (ref 70–99)
Potassium: 4.4 mmol/L (ref 3.5–5.2)
Sodium: 141 mmol/L (ref 134–144)
Total Protein: 5.7 g/dL — ABNORMAL LOW (ref 6.0–8.5)
eGFR: 58 mL/min/{1.73_m2} — ABNORMAL LOW (ref >59)

## 2022-10-27 LAB — IRON,TIBC AND FERRITIN PANEL
Ferritin: 31 ng/mL (ref 15–150)
Iron Saturation: 37 % (ref 15–55)
Iron: 107 ug/dL (ref 27–139)
Total Iron Binding Capacity: 292 ug/dL (ref 250–450)
UIBC: 185 ug/dL (ref 118–369)

## 2022-10-27 LAB — B12 AND FOLATE PANEL
Folate: 14.8 ng/mL (ref >3.0)
Vitamin B-12: 694 pg/mL (ref 232–1245)

## 2022-11-04 ENCOUNTER — Encounter: Payer: Self-pay | Admitting: *Deleted

## 2022-11-13 ENCOUNTER — Telehealth: Payer: Self-pay | Admitting: *Deleted

## 2022-11-13 MED ORDER — PEG 3350-KCL-NA BICARB-NACL 420 G PO SOLR: 4000.0000 mL | Freq: Once | ORAL | 0 refills | Status: AC

## 2022-11-13 NOTE — Telephone Encounter (Signed)
Spoke with pt. She has been scheduled for TCS/EGD +/- DIL with Dr. Jena Gauss ASA 3 on 10/9 at 830am. Aware will call back with pre-op appt. Rx for prep to be sent to pharmacy. Aware to hold iron x 7 days prior.  PA done and approved via cohere. DOS: 12/09/2022 - 02/08/2023, Authorization #[id number]

## 2022-11-16 ENCOUNTER — Encounter: Payer: Self-pay | Admitting: *Deleted

## 2022-11-20 ENCOUNTER — Ambulatory Visit: Payer: Medicare HMO | Attending: Cardiology | Admitting: Cardiology

## 2022-11-20 ENCOUNTER — Encounter: Payer: Self-pay | Admitting: Cardiology

## 2022-11-20 VITALS — BP 130/80 | HR 63 | Ht 66.0 in | Wt 164.4 lb

## 2022-11-20 DIAGNOSIS — E782 Mixed hyperlipidemia: Secondary | ICD-10-CM | POA: Diagnosis not present

## 2022-11-20 DIAGNOSIS — R002 Palpitations: Secondary | ICD-10-CM | POA: Diagnosis not present

## 2022-11-20 DIAGNOSIS — I25119 Atherosclerotic heart disease of native coronary artery with unspecified angina pectoris: Secondary | ICD-10-CM | POA: Diagnosis not present

## 2022-11-20 DIAGNOSIS — I10 Essential (primary) hypertension: Secondary | ICD-10-CM

## 2022-11-20 NOTE — Progress Notes (Signed)
Cardiology Office Note  Date: 11/20/2022   ID: Carmen [name], DOB [birth year], MRN [id number]  History of Present Illness: Carmen [name] is an 82 y.o. female last seen in February 2023 by Ms. Lilla Shook, I reviewed the note.  She was also seen as an inpatient consult in March of this year with follow-up testing as noted below.  She presents reporting no significant exertional chest pain, stable NYHA class II dyspnea.  She has had no progressive palpitations, no syncope.  I reviewed her medications.  She states that although Cozaar 25 mg has been on her list and just recently refilled, she has actually not been taking this since March.  She is on aspirin, Norvasc, Lipitor, and Toprol-XL otherwise from a cardiac perspective.  I reviewed her most recent lab work.  Her last LDL was 57 in January 2023.  She is pending colonoscopy in early October with Dr. Jena Gauss.  Physical Exam: VS:  BP 130/80 (BP Location: Right Arm, Patient Position: Sitting, Cuff Size: Normal)   Pulse 63   Ht 5\' 6"  (1.676 m)   Wt 164 lb 6.4 oz (74.6 kg)   SpO2 93%   BMI 26.53 kg/m , BMI Body mass index is 26.53 kg/m.  Wt Readings from Last 3 Encounters:  11/20/22 164 lb 6.4 oz (74.6 kg)  10/22/22 156 lb 9.6 oz (71 kg)  05/06/22 158 lb 3.2 oz (71.8 kg)    General: Patient appears comfortable at rest. HEENT: Conjunctiva and lids normal. Neck: Supple, no elevated JVP or carotid bruits. Lungs: Clear to auscultation, nonlabored breathing at rest. Cardiac: Regular rate and rhythm, no S3 or significant systolic murmur. Extremities: No pitting edema.  ECG:  An ECG dated 05/06/2022 was personally reviewed today and demonstrated:  Sinus rhythm.  Labwork: January 2023: Cholesterol 122, triglycerides 128, HDL 44, LDL 57 05/06/2022: Magnesium 2.0 10/26/2022: ALT 26; AST 18; BUN 16; Creatinine, Ser 0.98; Hemoglobin 12.4; Platelets 216; Potassium 4.4; Sodium 141   Other Studies Reviewed Today:  Carotid Dopplers  05/07/2022: IMPRESSION: Minimal to moderate amount of bilateral atherosclerotic plaque, left-greater-than-right, not resulting in a hemodynamically significant stenosis within either internal carotid artery.  Lexiscan Myoview 05/07/2022:   The study is normal. There are no perfusion defects consistent with prior infarct or current ischemia.  The study is low risk.   No ST deviation was noted.   LV perfusion is normal.   Left ventricular function is normal. Nuclear stress EF: 68 %. The left ventricular ejection fraction is hyperdynamic (>65%). End diastolic cavity size is normal.   There is a moderate size mild intensity inferior defect most intense in the resting images with normal wall motion consistent with diaphragmatic attenuation. There is a small mild intensity mid to distal anterior defect most intense in the resting images with normal wall motion, findings are consistent with breast attenuation.  Echocardiogram 05/07/2022:  1. Left ventricular ejection fraction, by estimation, is 60 to 65%. The  left ventricle has normal function. The left ventricle has no regional  wall motion abnormalities. There is moderate left ventricular hypertrophy.  Left ventricular diastolic  parameters are indeterminate. Elevated left atrial pressure.   2. Right ventricular systolic function is normal. The right ventricular  size is normal. Tricuspid regurgitation signal is inadequate for assessing  PA pressure.   3. Left atrial size was severely dilated.   4. The mitral valve is abnormal. Mild mitral valve regurgitation. No  evidence of mitral stenosis.   5. The aortic valve  has an indeterminant number of cusps. There is mild  calcification of the aortic valve. There is mild thickening of the aortic  valve. Aortic valve regurgitation is not visualized. No aortic stenosis is  present.   Assessment and Plan:  1.  CAD status post DES to the LAD in 2009.  Follow-up Lexiscan Myoview in March demonstrated no  evidence of ischemia with LVEF 68%.  She does not describe any angina.  Continue aspirin and Lipitor.  2.  Essential hypertension.  Blood pressure control reasonable today.  She is on Norvasc and Toprol-XL, Cozaar being taken off the list for now since she has not been taking it.  Continue to track blood pressure at home.  3.  Mixed hyperlipidemia.  LDL 57 in January of last year.  Continue Lipitor.  4.  Palpitations with previously documented brief SVT as well as atrial and ventricular ectopy.  Doing well on Toprol-XL.  5.  Asymptomatic carotid artery disease, mild to moderate bilateral ICA atherosclerosis by carotid Dopplers in March.  Continue aspirin and statin.  Disposition:  Follow up  1 year, sooner if needed.  Signed, Jonelle Sidle, M.D., F.A.C.C. [facility]

## 2022-11-20 NOTE — Patient Instructions (Signed)
Medication Instructions:  Your physician recommends that you continue on your current medications as directed. Please refer to the Current Medication list given to you today.   Labwork: None today  Testing/Procedures: None today  Follow-Up: 6 months  Any Other Special Instructions Will Be Listed Below (If Applicable).  If you need a refill on your cardiac medications before your next appointment, please call your pharmacy.

## 2022-12-02 ENCOUNTER — Telehealth: Payer: Self-pay | Admitting: *Deleted

## 2022-12-02 NOTE — Telephone Encounter (Signed)
Pt called in. She is scheduled on 10/9 with Dr. Jena Gauss for procedures and needs to cancel right now. She will call back to reschedule. Reports has terrible UTI and just doesn't want to do this right.

## 2022-12-07 ENCOUNTER — Encounter (HOSPITAL_COMMUNITY): Payer: Medicare HMO

## 2022-12-09 ENCOUNTER — Encounter (HOSPITAL_COMMUNITY): Admission: RE | Payer: Self-pay | Source: Home / Self Care

## 2022-12-09 ENCOUNTER — Ambulatory Visit (HOSPITAL_COMMUNITY): Admission: RE | Admit: 2022-12-09 | Payer: Medicare HMO | Source: Home / Self Care | Admitting: Internal Medicine

## 2022-12-09 SURGERY — COLONOSCOPY WITH PROPOFOL
Anesthesia: Monitor Anesthesia Care

## 2023-01-12 ENCOUNTER — Encounter: Payer: Self-pay | Admitting: Internal Medicine

## 2023-03-04 ENCOUNTER — Other Ambulatory Visit (HOSPITAL_COMMUNITY): Payer: Self-pay | Admitting: Family Medicine

## 2023-03-04 DIAGNOSIS — R519 Headache, unspecified: Secondary | ICD-10-CM

## 2023-03-10 ENCOUNTER — Ambulatory Visit (HOSPITAL_COMMUNITY): Payer: Medicare HMO

## 2023-03-27 ENCOUNTER — Ambulatory Visit (HOSPITAL_COMMUNITY)
Admission: RE | Admit: 2023-03-27 | Discharge: 2023-03-27 | Disposition: A | Discharge status: Home / Self Care | Payer: Medicare HMO | Source: Ambulatory Visit | Attending: Family Medicine | Admitting: Family Medicine

## 2023-03-27 DIAGNOSIS — R519 Headache, unspecified: Secondary | ICD-10-CM | POA: Insufficient documentation

## 2023-07-07 ENCOUNTER — Encounter: Payer: Self-pay | Admitting: Cardiology

## 2023-07-07 ENCOUNTER — Ambulatory Visit: Attending: Cardiology | Admitting: Cardiology

## 2023-07-07 VITALS — BP 126/80 | HR 70 | Ht 65.0 in | Wt 159.2 lb

## 2023-07-07 DIAGNOSIS — R002 Palpitations: Secondary | ICD-10-CM | POA: Diagnosis not present

## 2023-07-07 DIAGNOSIS — E782 Mixed hyperlipidemia: Secondary | ICD-10-CM | POA: Diagnosis not present

## 2023-07-07 DIAGNOSIS — I25119 Atherosclerotic heart disease of native coronary artery with unspecified angina pectoris: Secondary | ICD-10-CM | POA: Diagnosis not present

## 2023-07-07 DIAGNOSIS — I10 Essential (primary) hypertension: Secondary | ICD-10-CM

## 2023-07-07 NOTE — Progress Notes (Signed)
    Cardiology Office Note  Date: 07/07/2023   ID: Carmen [name], DOB 1940/03/16, MRN [id number]  History of Present Illness: Carmen [name] is an 83 y.o. female last seen in September 2024.  She is here for a routine visit.  She reports intermittent palpitations, sometimes in the evening after she has had a busy day.  No associated chest pain or syncope.  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  We went over her medications.  She reports compliance and no obvious intolerances.  I reviewed her recent lab work obtained at PCP visit earlier today and outlined below.  I reviewed her ECG which shows normal sinus rhythm, decreased R wave progression.  Physical Exam: VS:  BP 126/80   Pulse 70   Ht 5\' 5"  (1.651 m)   Wt 159 lb 3.2 oz (72.2 kg)   SpO2 93%   BMI 26.49 kg/m , BMI Body mass index is 26.49 kg/m.  Wt Readings from Last 3 Encounters:  07/07/23 159 lb 3.2 oz (72.2 kg)  11/20/22 164 lb 6.4 oz (74.6 kg)  10/22/22 156 lb 9.6 oz (71 kg)    General: Patient appears comfortable at rest. HEENT: Conjunctiva and lids normal. Neck: Supple, no elevated JVP or carotid bruits. Lungs: Clear to auscultation, nonlabored breathing at rest. Cardiac: Regular rate and rhythm, no S3 or significant systolic murmur. Extremities: No pitting edema.  ECG:  An ECG dated 05/06/2022 was personally reviewed today and demonstrated:  Sinus rhythm.  Labwork: 10/26/2022: ALT 26; AST 18; BUN 16; Creatinine, Ser 0.98; Hemoglobin 12.4; Platelets 216; Potassium 4.4; Sodium 141  October 2024: Cholesterol 169, triglycerides 164, HDL 52, LDL 89 January 2025: Hemoglobin 14, platelets 221, BUN 17, creatinine 1.07, GFR 52, potassium 4.5, AST 24, ALT 37, hemoglobin A1c 7.1% May 2025: Cholesterol 118, TG 189, HDL 36, LDL 56, BUN 17, creatinine 1.05, GFR 53, potassium 4.5, AST 18, ALT 31, Hgb 14.1, platelets 185, TSH 1.0  Other Studies Reviewed Today:  No interval cardiac testing for review  today.  Assessment and Plan:  1.  CAD status post DES to the LAD in 2009.  Follow-up Lexiscan  Myoview  in March 2024 demonstrated no evidence of ischemia with LVEF 68%.  She reports no active angina on medical therapy.  ECG reviewed and stable.  Continue aspirin  81 mg daily and Lipitor 40 mg daily.   2.  Primary hypertension.  Norvasc  10 mg daily.   3.  Mixed hyperlipidemia.  Recent LDL 56.  Continue Lipitor 40 mg daily.   4.  Palpitations with previously documented brief SVT as well as atrial and ventricular ectopy.  Reports intermittent palpitations as discussed above, no dizziness or syncope.  Continue Toprol -XL 100 mg in the morning and 50 mg in the evening.   5.  Asymptomatic carotid artery disease, mild to moderate bilateral ICA atherosclerosis by carotid Dopplers in March 2024.  Continue aspirin  and statin.  Disposition:  Follow up  6 months.  Signed, Gerard Knight, M.D., F.A.C.C. [facility]

## 2023-07-07 NOTE — Patient Instructions (Signed)
 Medication Instructions:  Your physician recommends that you continue on your current medications as directed. Please refer to the Current Medication list given to you today.   Labwork: None today  Testing/Procedures: None today  Follow-Up: 6 months  Any Other Special Instructions Will Be Listed Below (If Applicable).  If you need a refill on your cardiac medications before your next appointment, please call your pharmacy.

## 2023-09-28 ENCOUNTER — Telehealth: Payer: Self-pay | Admitting: Cardiology

## 2023-09-28 NOTE — Telephone Encounter (Signed)
 Office note from 07/07/23 with Dr.McDowell:   4.  Palpitations with previously documented brief SVT as well as atrial and ventricular ectopy.  Reports intermittent palpitations as discussed above, no dizziness or syncope.  Continue Toprol -XL 100 mg in the morning and 50 mg in the evening.     I spoke with patient and she said the only reason she called was to make her 6 months follow up appointment. Appointment made for 01/12/24 in [place]  Her BP is 138/-67, HR 78, she said she has occasional palpitations and is not concerned.

## 2023-09-28 NOTE — Telephone Encounter (Signed)
 Patient c/o Palpitations:  STAT if patient reporting lightheadedness, shortness of breath, or chest pain  How long have you had palpitations/irregular HR/ Afib? Are you having the symptoms now?   No  Are you currently experiencing lightheadedness, SOB or CP?   No  Do you have a history of afib (atrial fibrillation) or irregular heart rhythm?   Yes  Have you checked your BP or HR? (document readings if available):   127/72    Are you experiencing any other symptoms?   No   Patient stated she has been having heart fluttering.

## 2024-01-12 ENCOUNTER — Ambulatory Visit: Attending: Cardiology

## 2024-01-12 ENCOUNTER — Encounter: Payer: Self-pay | Admitting: Cardiology

## 2024-01-12 ENCOUNTER — Ambulatory Visit: Attending: Cardiology | Admitting: Cardiology

## 2024-01-12 VITALS — BP 126/84 | HR 74 | Ht 66.0 in | Wt 158.2 lb

## 2024-01-12 DIAGNOSIS — I25119 Atherosclerotic heart disease of native coronary artery with unspecified angina pectoris: Secondary | ICD-10-CM

## 2024-01-12 DIAGNOSIS — R002 Palpitations: Secondary | ICD-10-CM

## 2024-01-12 DIAGNOSIS — I10 Essential (primary) hypertension: Secondary | ICD-10-CM

## 2024-01-12 DIAGNOSIS — E782 Mixed hyperlipidemia: Secondary | ICD-10-CM

## 2024-01-12 NOTE — Progress Notes (Signed)
    Cardiology Office Note  Date: 01/12/2024   ID: Carmen [name], DOB 1941/01/15, MRN [id number]  History of Present Illness: Carmen [name] is an 83 y.o. female last seen in May.  She is here today with her son for a follow-up visit.  She reports interval increase in palpitations, describes a fluttering that can be triggered when she is active or upset.  No associated chest pain or syncope.  These tend to be very brief lasting only a few seconds.  Otherwise no change in stamina.  I reviewed her medications which are stable.  She is on Toprol -XL 100 mg in the morning and 50 mg in the evening at this point.  I reviewed her interval lab work.  Physical Exam: VS:  BP 126/84   Pulse 74   Ht 5' 6 (1.676 m)   Wt 158 lb 3.2 oz (71.8 kg)   SpO2 97%   BMI 25.53 kg/m , BMI Body mass index is 25.53 kg/m.  Wt Readings from Last 3 Encounters:  01/12/24 158 lb 3.2 oz (71.8 kg)  07/07/23 159 lb 3.2 oz (72.2 kg)  11/20/22 164 lb 6.4 oz (74.6 kg)    General: Patient appears comfortable at rest. HEENT: Conjunctiva and lids normal. Lungs: Clear to auscultation, nonlabored breathing at rest. Cardiac: Regular rate and rhythm, no S3 or significant systolic murmur, no pericardial rub.  ECG:  An ECG dated 05/06/2022 was personally reviewed today and demonstrated:  Sinus rhythm.  Labwork:  October 2024: Cholesterol 169, triglycerides 164, HDL 51, LDL 89 February 2025: Hemoglobin 14, platelets 221, BUN 17, creatinine 1.07, GFR 52, potassium 4.5, AST 24, ALT 37, hemoglobin A1c 7.1%  Other Studies Reviewed Today:  No interval cardiac testing for review today.  Assessment and Plan:  1.  Palpitations with previously documented brief SVT as well as atrial and ventricular ectopy.  Last cardiac monitor was in 2020, he reports increasing symptoms in the interim.  No associated chest pain or syncope.  We will plan a 7-day ZIO monitor, mainly to exclude atrial fibrillation or other arrhythmias  that would require change in treatment.  If relatively stable findings, can consider an increase in beta-blocker dose.  2.  CAD status post DES to the LAD in 2009.  Follow-up Lexiscan  Myoview  in March 2024 demonstrated no evidence of ischemia with LVEF 68%.  No active angina.  Continue aspirin  81 mg daily and statin.   3.  Primary hypertension.  Continue Norvasc  10 mg daily.   4.  Mixed hyperlipidemia.  HDL 51 and LDL 89 in October 2024.  Continue Lipitor 40 mg daily.   5.  Asymptomatic carotid artery disease, mild to moderate bilateral ICA atherosclerosis by carotid Dopplers in March 2024.  Continue aspirin  and statin.  Disposition:  Follow up 6 months.  Signed, Carmen [name], M.D., F.A.C.C. [facility]

## 2024-01-12 NOTE — Patient Instructions (Signed)
 Medication Instructions:  Your physician recommends that you continue on your current medications as directed. Please refer to the Current Medication list given to you today.   Labwork: None today  Testing/Procedures: ZIO XT- Long Term Monitor Instructions   Your physician has requested you wear your ZIO patch monitor___7____days.   This is a single patch monitor.  [employer] supplies one patch monitor per enrollment.  Additional stickers are not available.   Please do not apply patch if you will be having a Nuclear Stress Test, Echocardiogram, Cardiac CT, MRI, or Chest Xray during the time frame you would be wearing the monitor. The patch cannot be worn during these tests.  You cannot remove and re-apply the ZIO XT patch monitor.  Do not shower for the first 24 hours.  You may shower after the first 24 hours.   Press button if you feel a symptom. You will hear a small click.  Record Date, Time and Symptom in the Patient Log Book.   When you are ready to remove patch, follow instructions on last 2 pages of Patient Log Book.  Stick patch monitor onto last page of Patient Log Book.   Place Patient Log Book in [place] box.  Use locking tab on box and tape box closed securely.  The [employer] and [employer] has [employer] on it.  Please place in mailbox as soon as possible.  Your physician should have your test results approximately 7 days after the monitor has been mailed back to [facility].   Call [facility] Customer Care at [phone] if you have questions regarding your ZIO XT patch monitor.  Call them immediately if you see an orange light blinking on your monitor.   If your monitor falls off in less than 4 days contact our Monitor department at [phone].  If your monitor becomes loose or falls off after 4 days call [employer] at [phone] for suggestions on securing your monitor.    Follow-Up: 6 months  Any Other Special Instructions Will Be Listed Below (If  Applicable).  If you need a refill on your cardiac medications before your next appointment, please call your pharmacy.

## 2024-01-25 ENCOUNTER — Ambulatory Visit: Payer: Self-pay | Admitting: Cardiology

## 2024-01-25 DIAGNOSIS — R002 Palpitations: Secondary | ICD-10-CM | POA: Diagnosis not present

## 2024-01-26 MED ORDER — METOPROLOL SUCCINATE ER 100 MG PO TB24: 100.0000 mg | ORAL_TABLET | Freq: Two times a day (BID) | ORAL | 1 refills | Status: AC

## 2024-01-26 NOTE — Telephone Encounter (Signed)
-----   Message from Jayson Sierras sent at 01/25/2024  3:09 PM EST ----- Results reviewed.  Cardiac monitor did not show any atrial fibrillation.  She had rare PACs and PVCs as well as a few brief episodes of PSVT.  If she is still sensing increased palpitations, could  consider increasing Toprol -XL from 100 mg in the morning and 50 mg in the evening up to 100 mg twice daily. ----- Message ----- From: Sierras Jayson MATSU, MD Sent: 01/25/2024   2:31 PM EST To: Jayson MATSU Sierras, MD

## 2024-01-26 NOTE — Telephone Encounter (Signed)
 Patient informed and reports she does have palpitations all the time and agrees to increasing Toprol  XL to 100 mg BID. Copy sent to PCP

## 2024-02-07 ENCOUNTER — Emergency Department (HOSPITAL_COMMUNITY)
Admission: EM | Admit: 2024-02-07 | Discharge: 2024-02-07 | Disposition: A | Discharge status: Home / Self Care | Attending: Emergency Medicine | Admitting: Emergency Medicine

## 2024-02-07 ENCOUNTER — Other Ambulatory Visit: Payer: Self-pay

## 2024-02-07 ENCOUNTER — Encounter (HOSPITAL_COMMUNITY): Payer: Self-pay | Admitting: *Deleted

## 2024-02-07 ENCOUNTER — Emergency Department (HOSPITAL_COMMUNITY)

## 2024-02-07 DIAGNOSIS — R5383 Other fatigue: Secondary | ICD-10-CM | POA: Insufficient documentation

## 2024-02-07 DIAGNOSIS — R42 Dizziness and giddiness: Secondary | ICD-10-CM | POA: Diagnosis present

## 2024-02-07 DIAGNOSIS — R197 Diarrhea, unspecified: Secondary | ICD-10-CM | POA: Insufficient documentation

## 2024-02-07 DIAGNOSIS — Z7982 Long term (current) use of aspirin: Secondary | ICD-10-CM | POA: Diagnosis not present

## 2024-02-07 DIAGNOSIS — R531 Weakness: Secondary | ICD-10-CM | POA: Insufficient documentation

## 2024-02-07 DIAGNOSIS — I700 Atherosclerosis of aorta: Secondary | ICD-10-CM | POA: Insufficient documentation

## 2024-02-07 LAB — CBC WITH DIFFERENTIAL/PLATELET
Abs Immature Granulocytes: 0.03 K/uL (ref 0.00–0.07)
Basophils Absolute: 0.1 K/uL (ref 0.0–0.1)
Basophils Relative: 1 %
Eosinophils Absolute: 0.2 K/uL (ref 0.0–0.5)
Eosinophils Relative: 1 %
HCT: 44.2 % (ref 36.0–46.0)
Hemoglobin: 14.4 g/dL (ref 12.0–15.0)
Immature Granulocytes: 0 %
Lymphocytes Relative: 56 %
Lymphs Abs: 6.9 K/uL — ABNORMAL HIGH (ref 0.7–4.0)
MCH: 29.2 pg (ref 26.0–34.0)
MCHC: 32.6 g/dL (ref 30.0–36.0)
MCV: 89.7 fL (ref 80.0–100.0)
Monocytes Absolute: 0.6 K/uL (ref 0.1–1.0)
Monocytes Relative: 4 %
Neutro Abs: 4.7 K/uL (ref 1.7–7.7)
Neutrophils Relative %: 38 %
Platelets: 189 K/uL (ref 150–400)
RBC: 4.93 MIL/uL (ref 3.87–5.11)
RDW: 13.4 % (ref 11.5–15.5)
WBC: 12.5 K/uL — ABNORMAL HIGH (ref 4.0–10.5)
nRBC: 0 % (ref 0.0–0.2)

## 2024-02-07 LAB — COMPREHENSIVE METABOLIC PANEL WITH GFR
ALT: 49 U/L — ABNORMAL HIGH (ref 0–44)
AST: 34 U/L (ref 15–41)
Albumin: 4.3 g/dL (ref 3.5–5.0)
Alkaline Phosphatase: 96 U/L (ref 38–126)
Anion gap: 7 (ref 5–15)
BUN: 13 mg/dL (ref 8–23)
CO2: 31 mmol/L (ref 22–32)
Calcium: 9 mg/dL (ref 8.9–10.3)
Chloride: 104 mmol/L (ref 98–111)
Creatinine, Ser: 0.85 mg/dL (ref 0.44–1.00)
GFR, Estimated: >60 mL/min (ref >60)
Glucose, Bld: 186 mg/dL — ABNORMAL HIGH (ref 70–99)
Potassium: 4.1 mmol/L (ref 3.5–5.1)
Sodium: 142 mmol/L (ref 135–145)
Total Bilirubin: 0.7 mg/dL (ref 0.0–1.2)
Total Protein: 6.7 g/dL (ref 6.5–8.1)

## 2024-02-07 LAB — MAGNESIUM: Magnesium: 2.2 mg/dL (ref 1.7–2.4)

## 2024-02-07 LAB — TROPONIN T, HIGH SENSITIVITY
Troponin T High Sensitivity: <15 ng/L (ref 0–19)
Troponin T High Sensitivity: <15 ng/L (ref 0–19)

## 2024-02-07 LAB — RESP PANEL BY RT-PCR (RSV, FLU A&B, COVID)  RVPGX2
Influenza A by PCR: NEGATIVE
Influenza B by PCR: NEGATIVE
Resp Syncytial Virus by PCR: NEGATIVE
SARS Coronavirus 2 by RT PCR: NEGATIVE

## 2024-02-07 LAB — URINALYSIS, ROUTINE W REFLEX MICROSCOPIC
Bilirubin Urine: NEGATIVE
Glucose, UA: NEGATIVE mg/dL
Hgb urine dipstick: NEGATIVE
Ketones, ur: NEGATIVE mg/dL
Leukocytes,Ua: NEGATIVE
Nitrite: NEGATIVE
Protein, ur: NEGATIVE mg/dL
Specific Gravity, Urine: 1.006 (ref 1.005–1.030)
pH: 7 (ref 5.0–8.0)

## 2024-02-07 NOTE — ED Notes (Signed)
 Pt ambulated to restroom.

## 2024-02-07 NOTE — ED Provider Notes (Signed)
 [place] EMERGENCY DEPARTMENT AT [facility] Provider Note   CSN: [id number] Arrival date & time: 02/07/24  0700     Patient presents with: Weakness   Carmen [name] is a 83 y.o. female.  She is presenting with a complaint of feeling weak and fatigued for a few weeks.  She said she had a cold few weeks ago but no cough.  Since then she has had less energy.  Still eating and drinking okay.  No fever.  Had a few days of diarrhea.  No chest or abdominal pain.  No urinary symptoms.  No recent medication changes.  Today she felt lightheaded like she might pass out.  No numbness or focal weakness.   The history is provided by the patient and a relative.  Weakness Severity:  Moderate Onset quality:  Gradual Chronicity:  New Relieved by:  Nothing Worsened by:  Activity Ineffective treatments:  Rest Associated symptoms: diarrhea, dizziness and near-syncope   Associated symptoms: no abdominal pain, no chest pain, no cough, no dysuria, no falls, no fever, no nausea, no shortness of breath, no syncope and no vomiting        Prior to Admission medications   Medication Sig Start Date End Date Taking? Authorizing Provider  amLODipine  (NORVASC ) 10 MG tablet Take 10 mg by mouth daily.    [provider]  aspirin  81 MG chewable tablet Chew 81 mg by mouth daily.    [provider]  atorvastatin  (LIPITOR) 40 MG tablet Take 1 tablet by mouth daily. 05/01/17   [provider]  B-D ULTRAFINE III SHORT PEN 31G X 8 MM MISC See admin instructions. 10/07/22   [provider]  busPIRone  (BUSPAR ) 15 MG tablet Take 15 mg by mouth 3 (three) times daily. Take 1/2 tablet daily    [provider]  cephALEXin  (KEFLEX ) 250 MG capsule Take 250 mg by mouth daily.    [provider]  Dulaglutide (TRULICITY) 3 MG/0.5ML SOAJ Inject 3 mg every week by subcutaneous route for 28 days. 07/05/23   [provider]  ferrous sulfate  325 (65 FE) MG tablet  Take 1 tablet (325 mg total) by mouth daily with breakfast. 05/08/22   Tat, Alm, MD  glipiZIDE (GLUCOTROL) 5 MG tablet Take 5 mg by mouth 2 (two) times daily before a meal.    [provider]  LANTUS SOLOSTAR 100 UNIT/ML Solostar Pen Inject 10 Units into the skin daily as needed (Blood sugar). 08/18/21   [provider]  metFORMIN (GLUCOPHAGE) 500 MG tablet Take 500 mg by mouth 2 (two) times daily with a meal.    [provider]  metoprolol  succinate (TOPROL -XL) 100 MG 24 hr tablet Take 1 tablet (100 mg total) by mouth 2 (two) times daily. 01/26/24   Debera Jayson MATSU, MD  mirtazapine  (REMERON ) 45 MG tablet Take 45 mg by mouth at bedtime.    [provider]  Multiple Vitamin (MULTIVITAMIN WITH MINERALS) TABS tablet Take 1 tablet by mouth daily.    [provider]  pantoprazole  (PROTONIX ) 40 MG tablet TAKE 1 TABLET BY MOUTH TWICE DAILY. TAKE 30 MINS PRIOR TO MEALS 07/06/13   Shirlean Therisa ORN, NP  trimethoprim  (TRIMPEX ) 100 MG tablet Take 100 mg by mouth daily. 08/04/23   [provider]    Allergies: Sulfa antibiotics    Review of Systems  Constitutional:  Negative for fever.  Respiratory:  Negative for cough and shortness of breath.   Cardiovascular:  Positive for near-syncope.  Negative for chest pain and syncope.  Gastrointestinal:  Positive for diarrhea. Negative for abdominal pain, nausea and vomiting.  Genitourinary:  Negative for dysuria.  Musculoskeletal:  Negative for falls.  Neurological:  Positive for dizziness and weakness.    Updated Vital Signs BP (!) 176/74 (BP Location: Left Arm)   Pulse 75   Temp 97.8 F (36.6 C) (Oral)   Resp 18   Ht 5' 6 (1.676 m)   Wt 71.7 kg   SpO2 93%   BMI 25.50 kg/m   Physical Exam Vitals and nursing note reviewed.  Constitutional:      General: She is not in acute distress.    Appearance: Normal appearance. She is well-developed.  HENT:     Head: Normocephalic and atraumatic.  Eyes:      Conjunctiva/sclera: Conjunctivae normal.  Cardiovascular:     Rate and Rhythm: Normal rate and regular rhythm.     Heart sounds: No murmur heard. Pulmonary:     Effort: Pulmonary effort is normal. No respiratory distress.     Breath sounds: Normal breath sounds.  Abdominal:     Palpations: Abdomen is soft.     Tenderness: There is no abdominal tenderness. There is no guarding or rebound.  Musculoskeletal:        General: No deformity.     Cervical back: Neck supple.  Skin:    General: Skin is warm and dry.     Capillary Refill: Capillary refill takes less than 2 seconds.  Neurological:     General: No focal deficit present.     Mental Status: She is alert and oriented to person, place, and time.     Cranial Nerves: No cranial nerve deficit.     Sensory: No sensory deficit.     Motor: No weakness.     (all labs ordered are listed, but only abnormal results are displayed) Labs Reviewed  COMPREHENSIVE METABOLIC PANEL WITH GFR - Abnormal; Notable for the following components:      Result Value   Glucose, Bld 186 (*)    ALT 49 (*)    All other components within normal limits  CBC WITH DIFFERENTIAL/PLATELET - Abnormal; Notable for the following components:   WBC 12.5 (*)    Lymphs Abs 6.9 (*)    All other components within normal limits  RESP PANEL BY RT-PCR (RSV, FLU A&B, COVID)  RVPGX2  URINALYSIS, ROUTINE W REFLEX MICROSCOPIC  MAGNESIUM  TROPONIN T, HIGH SENSITIVITY  TROPONIN T, HIGH SENSITIVITY    EKG: EKG Interpretation Date/Time:  Monday February 07 2024 07:34:22 EST Ventricular Rate:  74 PR Interval:  196 QRS Duration:  100 QT Interval:  407 QTC Calculation: 452 R Axis:   -18  Text Interpretation: Sinus rhythm Atrial premature complex Low voltage, precordial leads Left ventricular hypertrophy No significant change since prior 5/25 Confirmed by Towana Sharper [phone]) on 02/07/2024 7:37:42 AM  Radiology: [facility] Chest 1 View Result Date: 02/07/2024 CLINICAL DATA:   Weakness.  Malaise. EXAM: CHEST  1 VIEW COMPARISON:  05/06/2022 FINDINGS: The heart size and mediastinal contours are within normal limits. Aortic atherosclerotic calcification noted. Both lungs are clear. The visualized skeletal structures are unremarkable. IMPRESSION: No active disease. Electronically Signed   By: Norleen DELENA Kil M.D.   On: 02/07/2024 08:20     Procedures   Medications Ordered in the ED - No data to display  Clinical Course as of 02/07/24 1654  Mon Feb 07, 2024  9253 Chest x-ray interpreted by me as no  acute infiltrate.  Awaiting radiology reading. [MB]  [phone] Patient's lab work fairly unremarkable.  Reviewed with her.  She is comfortable plan for discharge and outpatient follow-up with PCP. [MB]    Clinical Course User Index [MB] Towana Ozell BROCKS, MD                                 Medical Decision Making Amount and/or Complexity of Data Reviewed Labs: ordered. Radiology: ordered.   This patient complains of feeling weak and fatigued; this involves an extensive number of treatment Options and is a complaint that carries with it a high risk of complications and morbidity. The differential includes infection, dehydration, anemia, metabolic derangement  I ordered, reviewed and interpreted labs, which included CBC with mildly elevated white count, chemistries with elevated glucose and ALT, urinalysis without signs of infection, troponins flat, COVID and flu negative I ordered imaging studies which included chest x-ray and I independently    visualized and interpreted imaging which showed no acute findings Additional history obtained from patient's family member Previous records obtained and reviewed in epic including outpatient PCP notes Cardiac monitoring reviewed, sinus rhythm Social determinants considered, no significant barriers Critical Interventions: None  After the interventions stated above, I reevaluated the patient and found patient to be resting  comfortably in no distress Admission and further testing considered, no indications for admission at this time.  Recommended hydration and close follow-up with PCP.  Return instructions discussed.      Final diagnoses:  Generalized weakness    ED Discharge Orders     None          Towana Ozell BROCKS, MD 02/07/24 [phone]

## 2024-02-07 NOTE — ED Notes (Signed)
 Pt provided crackers and water

## 2024-02-07 NOTE — Discharge Instructions (Signed)
 You were seen in the emergency department for malaise weakness.  You had x-rays of your chest, blood work urinalysis that did not show an obvious explanation for your symptoms.  Your COVID and flu test were negative.  Please rest and drink plenty of fluids.  Follow-up with your regular doctor.  Return if any worsening or concerning symptoms

## 2024-02-07 NOTE — ED Triage Notes (Signed)
 Pt states she does not feel like herself and she feel weak  Pt states she has had a cold for about a week with no cough and has been taking mucines  Pt states she is usually so active and has not been acting the same and states this am when she got up she felt like she was going to pass out
